# Patient Record
Sex: Female | Born: 1985
Health system: Southern US, Community
[De-identification: ages and names within clinical notes are randomized; demographics above are authoritative.]

## PROBLEM LIST (undated history)

## (undated) ENCOUNTER — Inpatient Hospital Stay (HOSPITAL_COMMUNITY): Payer: Self-pay

## (undated) DIAGNOSIS — A749 Chlamydial infection, unspecified: Secondary | ICD-10-CM

## (undated) DIAGNOSIS — Z3480 Encounter for supervision of other normal pregnancy, unspecified trimester: Secondary | ICD-10-CM

## (undated) DIAGNOSIS — Z803 Family history of malignant neoplasm of breast: Secondary | ICD-10-CM

## (undated) DIAGNOSIS — Z87440 Personal history of urinary (tract) infections: Secondary | ICD-10-CM

## (undated) DIAGNOSIS — G43909 Migraine, unspecified, not intractable, without status migrainosus: Secondary | ICD-10-CM

## (undated) DIAGNOSIS — O98819 Other maternal infectious and parasitic diseases complicating pregnancy, unspecified trimester: Secondary | ICD-10-CM

## (undated) HISTORY — DX: Personal history of urinary (tract) infections: Z87.440

## (undated) HISTORY — PX: DILATION AND CURETTAGE OF UTERUS: SHX78

---
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
## 2005-01-22 ENCOUNTER — Inpatient Hospital Stay (HOSPITAL_COMMUNITY): Admission: AD | Admit: 2005-01-22 | Discharge: 2005-01-22 | Payer: Self-pay | Admitting: Family Medicine

## 2005-03-14 ENCOUNTER — Ambulatory Visit (HOSPITAL_COMMUNITY): Admission: RE | Admit: 2005-03-14 | Discharge: 2005-03-14 | Payer: Self-pay | Admitting: Family Medicine

## 2005-04-01 ENCOUNTER — Ambulatory Visit: Payer: Self-pay | Admitting: Certified Nurse Midwife

## 2005-04-01 ENCOUNTER — Inpatient Hospital Stay (HOSPITAL_COMMUNITY): Admission: AD | Admit: 2005-04-01 | Discharge: 2005-04-01 | Payer: Self-pay | Admitting: Obstetrics & Gynecology

## 2005-05-22 ENCOUNTER — Inpatient Hospital Stay (HOSPITAL_COMMUNITY): Admission: AD | Admit: 2005-05-22 | Discharge: 2005-05-22 | Payer: Self-pay | Admitting: Gynecology

## 2005-06-25 ENCOUNTER — Inpatient Hospital Stay (HOSPITAL_COMMUNITY): Admission: AD | Admit: 2005-06-25 | Discharge: 2005-06-25 | Payer: Self-pay | Admitting: Family Medicine

## 2005-06-25 ENCOUNTER — Ambulatory Visit: Payer: Self-pay | Admitting: *Deleted

## 2005-06-26 ENCOUNTER — Ambulatory Visit: Payer: Self-pay | Admitting: *Deleted

## 2005-06-26 ENCOUNTER — Inpatient Hospital Stay (HOSPITAL_COMMUNITY): Admission: AD | Admit: 2005-06-26 | Discharge: 2005-06-26 | Payer: Self-pay | Admitting: Obstetrics & Gynecology

## 2005-07-11 ENCOUNTER — Ambulatory Visit: Payer: Self-pay | Admitting: Obstetrics and Gynecology

## 2005-07-11 ENCOUNTER — Inpatient Hospital Stay (HOSPITAL_COMMUNITY): Admission: AD | Admit: 2005-07-11 | Discharge: 2005-07-12 | Payer: Self-pay | Admitting: *Deleted

## 2005-07-22 ENCOUNTER — Inpatient Hospital Stay (HOSPITAL_COMMUNITY): Admission: AD | Admit: 2005-07-22 | Discharge: 2005-07-23 | Payer: Self-pay | Admitting: Obstetrics & Gynecology

## 2005-07-22 ENCOUNTER — Ambulatory Visit: Payer: Self-pay | Admitting: Obstetrics and Gynecology

## 2005-07-31 ENCOUNTER — Ambulatory Visit: Payer: Self-pay | Admitting: *Deleted

## 2005-07-31 ENCOUNTER — Inpatient Hospital Stay (HOSPITAL_COMMUNITY): Admission: AD | Admit: 2005-07-31 | Discharge: 2005-07-31 | Payer: Self-pay | Admitting: Obstetrics & Gynecology

## 2005-08-08 ENCOUNTER — Ambulatory Visit: Payer: Self-pay | Admitting: Obstetrics and Gynecology

## 2005-08-08 ENCOUNTER — Inpatient Hospital Stay (HOSPITAL_COMMUNITY): Admission: AD | Admit: 2005-08-08 | Discharge: 2005-08-10 | Payer: Self-pay | Admitting: Obstetrics & Gynecology

## 2006-06-20 ENCOUNTER — Inpatient Hospital Stay (HOSPITAL_COMMUNITY): Admission: AD | Admit: 2006-06-20 | Discharge: 2006-06-20 | Payer: Self-pay | Admitting: Gynecology

## 2006-07-25 ENCOUNTER — Ambulatory Visit (HOSPITAL_COMMUNITY): Admission: RE | Admit: 2006-07-25 | Discharge: 2006-07-25 | Payer: Self-pay | Admitting: Obstetrics & Gynecology

## 2006-09-16 ENCOUNTER — Inpatient Hospital Stay (HOSPITAL_COMMUNITY): Admission: AD | Admit: 2006-09-16 | Discharge: 2006-09-16 | Payer: Self-pay | Admitting: Obstetrics and Gynecology

## 2006-10-11 ENCOUNTER — Observation Stay (HOSPITAL_COMMUNITY): Admission: AD | Admit: 2006-10-11 | Discharge: 2006-10-12 | Payer: Self-pay | Admitting: Gynecology

## 2006-11-06 ENCOUNTER — Inpatient Hospital Stay (HOSPITAL_COMMUNITY): Admission: AD | Admit: 2006-11-06 | Discharge: 2006-11-07 | Payer: Self-pay | Admitting: Obstetrics & Gynecology

## 2006-11-24 ENCOUNTER — Inpatient Hospital Stay (HOSPITAL_COMMUNITY): Admission: AD | Admit: 2006-11-24 | Discharge: 2006-11-24 | Payer: Self-pay | Admitting: Obstetrics

## 2006-12-07 ENCOUNTER — Inpatient Hospital Stay (HOSPITAL_COMMUNITY): Admission: AD | Admit: 2006-12-07 | Discharge: 2006-12-07 | Payer: Self-pay | Admitting: Obstetrics & Gynecology

## 2006-12-08 ENCOUNTER — Inpatient Hospital Stay (HOSPITAL_COMMUNITY): Admission: AD | Admit: 2006-12-08 | Discharge: 2006-12-11 | Payer: Self-pay | Admitting: Obstetrics & Gynecology

## 2007-09-22 ENCOUNTER — Inpatient Hospital Stay (HOSPITAL_COMMUNITY): Admission: AD | Admit: 2007-09-22 | Discharge: 2007-09-22 | Payer: Self-pay | Admitting: Obstetrics and Gynecology

## 2008-01-31 ENCOUNTER — Emergency Department (HOSPITAL_COMMUNITY): Admission: EM | Admit: 2008-01-31 | Discharge: 2008-01-31 | Payer: Self-pay | Admitting: Emergency Medicine

## 2008-02-03 ENCOUNTER — Emergency Department (HOSPITAL_COMMUNITY): Admission: EM | Admit: 2008-02-03 | Discharge: 2008-02-03 | Payer: Self-pay | Admitting: Emergency Medicine

## 2009-01-22 ENCOUNTER — Emergency Department (HOSPITAL_COMMUNITY): Admission: EM | Admit: 2009-01-22 | Discharge: 2009-01-23 | Payer: Self-pay | Admitting: Emergency Medicine

## 2009-08-11 ENCOUNTER — Emergency Department (HOSPITAL_COMMUNITY): Admission: EM | Admit: 2009-08-11 | Discharge: 2009-08-11 | Payer: Self-pay | Admitting: Emergency Medicine

## 2009-09-24 ENCOUNTER — Emergency Department (HOSPITAL_COMMUNITY): Admission: EM | Admit: 2009-09-24 | Discharge: 2009-09-24 | Payer: Self-pay | Admitting: Emergency Medicine

## 2010-04-24 ENCOUNTER — Emergency Department (HOSPITAL_COMMUNITY): Admission: EM | Admit: 2010-04-24 | Discharge: 2010-04-24 | Payer: Self-pay | Admitting: Emergency Medicine

## 2010-04-25 ENCOUNTER — Emergency Department (HOSPITAL_COMMUNITY): Admission: EM | Admit: 2010-04-25 | Discharge: 2010-04-25 | Payer: Self-pay | Admitting: Emergency Medicine

## 2010-06-02 ENCOUNTER — Inpatient Hospital Stay (HOSPITAL_COMMUNITY): Admission: AD | Admit: 2010-06-02 | Discharge: 2010-06-02 | Payer: Self-pay | Admitting: Obstetrics & Gynecology

## 2010-06-02 ENCOUNTER — Ambulatory Visit: Payer: Self-pay | Admitting: Gynecology

## 2010-06-14 ENCOUNTER — Inpatient Hospital Stay (HOSPITAL_COMMUNITY): Admission: AD | Admit: 2010-06-14 | Discharge: 2010-06-14 | Payer: Self-pay | Admitting: Obstetrics & Gynecology

## 2010-06-18 ENCOUNTER — Ambulatory Visit: Payer: Self-pay | Admitting: Nurse Practitioner

## 2010-06-18 ENCOUNTER — Inpatient Hospital Stay (HOSPITAL_COMMUNITY): Admission: AD | Admit: 2010-06-18 | Discharge: 2010-06-18 | Payer: Self-pay | Admitting: Obstetrics & Gynecology

## 2010-06-23 ENCOUNTER — Ambulatory Visit: Payer: Self-pay | Admitting: Obstetrics and Gynecology

## 2010-06-23 ENCOUNTER — Inpatient Hospital Stay (HOSPITAL_COMMUNITY): Admission: AD | Admit: 2010-06-23 | Discharge: 2010-06-23 | Payer: Self-pay | Admitting: Obstetrics & Gynecology

## 2010-06-24 ENCOUNTER — Inpatient Hospital Stay (HOSPITAL_COMMUNITY): Admission: AD | Admit: 2010-06-24 | Discharge: 2010-06-24 | Payer: Self-pay | Admitting: Obstetrics and Gynecology

## 2010-06-24 ENCOUNTER — Ambulatory Visit: Payer: Self-pay | Admitting: Gynecology

## 2010-11-20 ENCOUNTER — Encounter: Payer: Self-pay | Admitting: *Deleted

## 2010-11-20 ENCOUNTER — Encounter: Payer: Self-pay | Admitting: Obstetrics & Gynecology

## 2011-01-12 LAB — URINALYSIS, ROUTINE W REFLEX MICROSCOPIC
Bilirubin Urine: NEGATIVE
Bilirubin Urine: NEGATIVE
Glucose, UA: NEGATIVE mg/dL
Ketones, ur: NEGATIVE mg/dL
Nitrite: NEGATIVE
Protein, ur: 100 mg/dL — AB
Protein, ur: NEGATIVE mg/dL
Specific Gravity, Urine: 1.015 (ref 1.005–1.030)
Specific Gravity, Urine: >1.03 — ABNORMAL HIGH (ref 1.005–1.030)
Urobilinogen, UA: 0.2 mg/dL (ref 0.0–1.0)
Urobilinogen, UA: 1 mg/dL (ref 0.0–1.0)
pH: 7.5 (ref 5.0–8.0)

## 2011-01-12 LAB — CBC
HCT: 36.5 % (ref 36.0–46.0)
Hemoglobin: 11.8 g/dL — ABNORMAL LOW (ref 12.0–15.0)
Hemoglobin: 12.4 g/dL (ref 12.0–15.0)
MCH: 31.5 pg (ref 26.0–34.0)
MCH: 31.5 pg (ref 26.0–34.0)
MCH: 31.6 pg (ref 26.0–34.0)
MCHC: 33.7 g/dL (ref 30.0–36.0)
MCHC: 33.9 g/dL (ref 30.0–36.0)
MCV: 93.2 fL (ref 78.0–100.0)
Platelets: 337 10*3/uL (ref 150–400)
Platelets: 380 10*3/uL (ref 150–400)
RBC: 3.73 MIL/uL — ABNORMAL LOW (ref 3.87–5.11)
RBC: 3.86 MIL/uL — ABNORMAL LOW (ref 3.87–5.11)
RBC: 3.91 MIL/uL (ref 3.87–5.11)
RDW: 13.4 % (ref 11.5–15.5)
RDW: 13.6 % (ref 11.5–15.5)
WBC: 4.5 10*3/uL (ref 4.0–10.5)

## 2011-01-12 LAB — URINE MICROSCOPIC-ADD ON

## 2011-01-12 LAB — HCG, QUANTITATIVE, PREGNANCY: hCG, Beta Chain, Quant, S: 2561 m[IU]/mL — ABNORMAL HIGH (ref <5)

## 2011-01-12 LAB — URINE CULTURE
Colony Count: >=100000
Culture  Setup Time: 201108260403

## 2011-01-13 LAB — HCG, QUANTITATIVE, PREGNANCY: hCG, Beta Chain, Quant, S: 27594 m[IU]/mL — ABNORMAL HIGH (ref <5)

## 2011-01-13 LAB — URINALYSIS, ROUTINE W REFLEX MICROSCOPIC
Bilirubin Urine: NEGATIVE
Ketones, ur: NEGATIVE mg/dL
Nitrite: NEGATIVE
Protein, ur: NEGATIVE mg/dL
Specific Gravity, Urine: >1.03 — ABNORMAL HIGH (ref 1.005–1.030)
Urobilinogen, UA: 0.2 mg/dL (ref 0.0–1.0)

## 2011-01-13 LAB — GC/CHLAMYDIA PROBE AMP, GENITAL: Chlamydia, DNA Probe: NEGATIVE

## 2011-01-13 LAB — WET PREP, GENITAL: Yeast Wet Prep HPF POC: NONE SEEN

## 2011-01-13 LAB — CBC
MCH: 31.4 pg (ref 26.0–34.0)
MCHC: 33.6 g/dL (ref 30.0–36.0)
MCV: 93.5 fL (ref 78.0–100.0)
Platelets: 409 10*3/uL — ABNORMAL HIGH (ref 150–400)

## 2011-01-13 LAB — URINE CULTURE: Colony Count: NO GROWTH

## 2011-01-15 LAB — URINALYSIS, ROUTINE W REFLEX MICROSCOPIC
Glucose, UA: NEGATIVE mg/dL
Nitrite: POSITIVE — AB
Protein, ur: 30 mg/dL — AB
pH: 6 (ref 5.0–8.0)

## 2011-01-15 LAB — URINE MICROSCOPIC-ADD ON

## 2011-02-01 LAB — URINE MICROSCOPIC-ADD ON

## 2011-02-01 LAB — URINALYSIS, ROUTINE W REFLEX MICROSCOPIC
Glucose, UA: NEGATIVE mg/dL
Hgb urine dipstick: NEGATIVE
Protein, ur: NEGATIVE mg/dL
pH: 6.5 (ref 5.0–8.0)

## 2011-02-01 LAB — WET PREP, GENITAL

## 2011-02-01 LAB — GC/CHLAMYDIA PROBE AMP, GENITAL: Chlamydia, DNA Probe: NEGATIVE

## 2011-03-27 ENCOUNTER — Emergency Department (HOSPITAL_COMMUNITY)
Admission: EM | Admit: 2011-03-27 | Discharge: 2011-03-27 | Disposition: A | Discharge status: Home / Self Care | Payer: Medicaid Other | Attending: Emergency Medicine | Admitting: Emergency Medicine

## 2011-03-27 DIAGNOSIS — R110 Nausea: Secondary | ICD-10-CM | POA: Insufficient documentation

## 2011-03-27 DIAGNOSIS — R109 Unspecified abdominal pain: Secondary | ICD-10-CM | POA: Insufficient documentation

## 2011-03-27 DIAGNOSIS — N739 Female pelvic inflammatory disease, unspecified: Secondary | ICD-10-CM | POA: Insufficient documentation

## 2011-03-27 DIAGNOSIS — R10819 Abdominal tenderness, unspecified site: Secondary | ICD-10-CM | POA: Insufficient documentation

## 2011-03-27 LAB — POCT I-STAT, CHEM 8
BUN: 19 mg/dL (ref 6–23)
Chloride: 104 mEq/L (ref 96–112)
Creatinine, Ser: 1 mg/dL (ref 0.4–1.2)
Potassium: 3.7 mEq/L (ref 3.5–5.1)
Sodium: 139 mEq/L (ref 135–145)
TCO2: 26 mmol/L (ref 0–100)

## 2011-03-27 LAB — URINALYSIS, ROUTINE W REFLEX MICROSCOPIC
Ketones, ur: NEGATIVE mg/dL
Nitrite: NEGATIVE
Protein, ur: NEGATIVE mg/dL

## 2011-03-27 LAB — POCT PREGNANCY, URINE: Preg Test, Ur: NEGATIVE

## 2011-03-27 LAB — WET PREP, GENITAL

## 2011-03-28 LAB — GC/CHLAMYDIA PROBE AMP, GENITAL: GC Probe Amp, Genital: NEGATIVE

## 2011-07-13 ENCOUNTER — Inpatient Hospital Stay (HOSPITAL_COMMUNITY)
Admission: AD | Admit: 2011-07-13 | Discharge: 2011-07-13 | Disposition: A | Discharge status: Home / Self Care | Payer: Medicaid Other | Source: Ambulatory Visit | Attending: Family Medicine | Admitting: Family Medicine

## 2011-07-13 ENCOUNTER — Encounter (HOSPITAL_COMMUNITY): Payer: Self-pay | Admitting: *Deleted

## 2011-07-13 ENCOUNTER — Inpatient Hospital Stay (HOSPITAL_COMMUNITY): Payer: Medicaid Other

## 2011-07-13 DIAGNOSIS — O99891 Other specified diseases and conditions complicating pregnancy: Secondary | ICD-10-CM | POA: Insufficient documentation

## 2011-07-13 DIAGNOSIS — G43909 Migraine, unspecified, not intractable, without status migrainosus: Secondary | ICD-10-CM | POA: Insufficient documentation

## 2011-07-13 DIAGNOSIS — R109 Unspecified abdominal pain: Secondary | ICD-10-CM | POA: Insufficient documentation

## 2011-07-13 DIAGNOSIS — O26899 Other specified pregnancy related conditions, unspecified trimester: Secondary | ICD-10-CM

## 2011-07-13 HISTORY — DX: Migraine, unspecified, not intractable, without status migrainosus: G43.909

## 2011-07-13 LAB — URINALYSIS, ROUTINE W REFLEX MICROSCOPIC
Protein, ur: NEGATIVE mg/dL
Urobilinogen, UA: 0.2 mg/dL (ref 0.0–1.0)

## 2011-07-13 LAB — WET PREP, GENITAL
Trich, Wet Prep: NONE SEEN
Yeast Wet Prep HPF POC: NONE SEEN

## 2011-07-13 LAB — CBC
MCHC: 33.5 g/dL (ref 30.0–36.0)
MCV: 90.2 fL (ref 78.0–100.0)
Platelets: 363 10*3/uL (ref 150–400)
RDW: 12.2 % (ref 11.5–15.5)
WBC: 4.7 10*3/uL (ref 4.0–10.5)

## 2011-07-13 LAB — DIFFERENTIAL
Basophils Absolute: 0 10*3/uL (ref 0.0–0.1)
Basophils Relative: 1 % (ref 0–1)
Eosinophils Absolute: 0.1 10*3/uL (ref 0.0–0.7)
Eosinophils Relative: 1 % (ref 0–5)
Lymphocytes Relative: 49 % — ABNORMAL HIGH (ref 12–46)

## 2011-07-13 LAB — POCT PREGNANCY, URINE: Preg Test, Ur: POSITIVE

## 2011-07-13 LAB — URINE MICROSCOPIC-ADD ON

## 2011-07-13 LAB — HCG, QUANTITATIVE, PREGNANCY: hCG, Beta Chain, Quant, S: 9454 m[IU]/mL — ABNORMAL HIGH (ref <5)

## 2011-07-13 MED ORDER — ACETAMINOPHEN 500 MG PO TABS
1000.0000 mg | ORAL_TABLET | Freq: Once | ORAL | Status: AC
  Administered 2011-07-13: 1000 mg via ORAL

## 2011-07-13 NOTE — Progress Notes (Signed)
Pt states she has been having abdominal cramping for several days. Has been having bad headaches for about 2 weeks, has periods of sweating. Has a vaginal d/c with no odor. Did a UPT that was POS but cannot remember the date of her last period in August.

## 2011-07-13 NOTE — ED Provider Notes (Signed)
History     CSN: [id number] Arrival date & time: 07/13/2011  1:37 PM   Chief Complaint  Patient presents with  . Abdominal Pain      HPI Margaret Knight 82 y.o.presents with migraine headache and lower abdominal cramping on the right. Has vaginal discharge that is clear without odor.   LMP ? Thinks she had it last month but it wasn't normal, lighter and shorter.  1 partner without contraception.   +UPT at home.  Hx migraine headache treated with "heavy" ibuprofen.  Hasn't had one lately.    Past Medical History  Diagnosis Date  . Migraines      Past Surgical History  Procedure Date  . No past surgeries     No family history on file.  History  Substance Use Topics  . Smoking status: Never Smoker   . Smokeless tobacco: Not on file  . Alcohol Use: No    OB History    Grav Para Term Preterm Abortions TAB SAB Ect Mult Living   5 2 2  1  1   2       Review of Systems  Gastrointestinal:       Abdominal pain   Genitourinary: Positive for vaginal discharge and pelvic pain.  Neurological: Positive for headaches.    Allergies  Review of patient's allergies indicates no known allergies.  Home Medications  No current outpatient prescriptions on file.  Physical Exam    BP 106/69  Pulse 77  Temp(Src) 99.4 F (37.4 C) (Oral)  Resp 18  Ht 5\' 7"  (1.702 m)  Wt 146 lb 3.2 oz (66.316 kg)  BMI 22.90 kg/m2  SpO2 99%  LMP 05/08/2011  Physical Exam  Constitutional: She is oriented to person, place, and time. She appears well-developed and well-nourished. No distress.  Neck: Normal range of motion.  Pulmonary/Chest: Effort normal.  Abdominal: Soft. She exhibits no distension and no mass. There is no tenderness. There is no rebound and no guarding.  Genitourinary: Uterus is enlarged and tender. Cervix exhibits motion tenderness. Cervix exhibits no discharge and no friability. Right adnexum displays no mass, no tenderness and no fullness. Left adnexum displays tenderness.  Left adnexum displays no mass and no fullness. No erythema or bleeding around the vagina. Vaginal discharge found.  Neurological: She is alert and oriented to person, place, and time. No cranial nerve deficit. Coordination normal.       Normal speech and coordination  Skin: Skin is warm and dry.  Psychiatric: She has a normal mood and affect.    ED Course  Procedures   BLOOD TYPE FROM PREVIOUS RECORD IS O POSITIVE  Results for orders placed during the hospital encounter of 07/13/11  URINALYSIS, ROUTINE W REFLEX MICROSCOPIC      Component Value Range   Color, Urine YELLOW  YELLOW    Appearance HAZY (*) CLEAR    Specific Gravity, Urine 1.025  1.005 - 1.030    pH 6.0  5.0 - 8.0    Glucose, UA NEGATIVE  NEGATIVE (mg/dL)   Hgb urine dipstick NEGATIVE  NEGATIVE    Bilirubin Urine NEGATIVE  NEGATIVE    Ketones, ur NEGATIVE  NEGATIVE (mg/dL)   Protein, ur NEGATIVE  NEGATIVE (mg/dL)   Urobilinogen, UA 0.2  0.0 - 1.0 (mg/dL)   Nitrite NEGATIVE  NEGATIVE    Leukocytes, UA SMALL (*) NEGATIVE   POCT PREGNANCY, URINE      Component Value Range   Preg Test, Ur POSITIVE     Results  for orders placed during the hospital encounter of 07/13/11 (from the past 24 hour(s))  URINALYSIS, ROUTINE W REFLEX MICROSCOPIC     Status: Abnormal   Collection Time   07/13/11  2:10 PM      Component Value Range   Color, Urine YELLOW  YELLOW    Appearance HAZY (*) CLEAR    Specific Gravity, Urine 1.025  1.005 - 1.030    pH 6.0  5.0 - 8.0    Glucose, UA NEGATIVE  NEGATIVE (mg/dL)   Hgb urine dipstick NEGATIVE  NEGATIVE    Bilirubin Urine NEGATIVE  NEGATIVE    Ketones, ur NEGATIVE  NEGATIVE (mg/dL)   Protein, ur NEGATIVE  NEGATIVE (mg/dL)   Urobilinogen, UA 0.2  0.0 - 1.0 (mg/dL)   Nitrite NEGATIVE  NEGATIVE    Leukocytes, UA SMALL (*) NEGATIVE   URINE MICROSCOPIC-ADD ON     Status: Abnormal   Collection Time   07/13/11  2:10 PM      Component Value Range   Squamous Epithelial / LPF MANY (*) RARE    WBC,  UA 3-6  <3 (WBC/hpf)   Bacteria, UA FEW (*) RARE    Urine-Other MUCOUS PRESENT    POCT PREGNANCY, URINE     Status: Normal   Collection Time   07/13/11  2:22 PM      Component Value Range   Preg Test, Ur POSITIVE    HCG, QUANTITATIVE, PREGNANCY     Status: Abnormal   Collection Time   07/13/11  2:45 PM      Component Value Range   hCG, Beta Chain, Quant, S 9454 (*) <5 (mIU/mL)  CBC     Status: Abnormal   Collection Time   07/13/11  2:55 PM      Component Value Range   WBC 4.7  4.0 - 10.5 (K/uL)   RBC 3.97  3.87 - 5.11 (MIL/uL)   Hemoglobin 12.0  12.0 - 15.0 (g/dL)   HCT 16.1 (*) 09.6 - 46.0 (%)   MCV 90.2  78.0 - 100.0 (fL)   MCH 30.2  26.0 - 34.0 (pg)   MCHC 33.5  30.0 - 36.0 (g/dL)   RDW 04.5  40.9 - 81.1 (%)   Platelets 363  150 - 400 (K/uL)  DIFFERENTIAL     Status: Abnormal   Collection Time   07/13/11  2:55 PM      Component Value Range   Neutrophils Relative 38 (*) 43 - 77 (%)   Neutro Abs 1.8  1.7 - 7.7 (K/uL)   Lymphocytes Relative 49 (*) 12 - 46 (%)   Lymphs Abs 2.3  0.7 - 4.0 (K/uL)   Monocytes Relative 12  3 - 12 (%)   Monocytes Absolute 0.6  0.1 - 1.0 (K/uL)   Eosinophils Relative 1  0 - 5 (%)   Eosinophils Absolute 0.1  0.0 - 0.7 (K/uL)   Basophils Relative 1  0 - 1 (%)   Basophils Absolute 0.0  0.0 - 0.1 (K/uL)  WET PREP, GENITAL     Status: Abnormal   Collection Time   07/13/11  3:05 PM      Component Value Range   Yeast, Wet Prep NONE SEEN  NONE SEEN    Trich, Wet Prep NONE SEEN  NONE SEEN    Clue Cells, Wet Prep FEW (*) NONE SEEN    WBC, Wet Prep HPF POC MANY (*) NONE SEEN     ULTRASOUND REPORT:  Irregular single intrauterine gestational sac with  poor decidual reaction, no YS or FP.  Poor prognostic indicators and may represent an abnormal nonprogressing gestation.    At the time of discharge, the patient states the tylenol completely relieved her headache.   Assessment:  Abdominal pain in early pregnancy                        Migraine headache  relieved with tylenol  PLAN; repeat BHCG in 48 hrs.            Discussed u/s findings with the patient.  Explained irregular sac could be sign that a miscarriage will follow.          Matt Holmes, NP 07/13/11 1720  Matt Holmes, NP 07/13/11 1722

## 2011-07-13 NOTE — ED Provider Notes (Signed)
Attestation of Attending Supervision of Advanced Practitioner: Evaluation and management procedures were performed by the PA/NP/CNM/OB Fellow under my supervision/collaboration. Chart reviewed and agree with management and plan.  Aileene Lanum A 07/13/2011 6:46 PM   

## 2011-08-08 LAB — URINALYSIS, ROUTINE W REFLEX MICROSCOPIC
Bilirubin Urine: NEGATIVE
Ketones, ur: NEGATIVE
Nitrite: NEGATIVE
Protein, ur: NEGATIVE
Specific Gravity, Urine: 1.01
Urobilinogen, UA: 0.2

## 2011-08-08 LAB — CBC
HCT: 37.5
Hemoglobin: 13
MCHC: 34.5
MCV: 91.2
RDW: 12.9

## 2011-08-08 LAB — WET PREP, GENITAL
Trich, Wet Prep: NONE SEEN
Yeast Wet Prep HPF POC: NONE SEEN

## 2011-08-08 LAB — GC/CHLAMYDIA PROBE AMP, GENITAL
Chlamydia, DNA Probe: NEGATIVE
GC Probe Amp, Genital: NEGATIVE

## 2011-08-08 LAB — POCT PREGNANCY, URINE: Operator id: 117411

## 2011-09-11 LAB — OB RESULTS CONSOLE RUBELLA ANTIBODY, IGM: Rubella: IMMUNE

## 2011-09-11 LAB — OB RESULTS CONSOLE HEPATITIS B SURFACE ANTIGEN: Hepatitis B Surface Ag: NEGATIVE

## 2011-09-11 LAB — OB RESULTS CONSOLE ANTIBODY SCREEN: Antibody Screen: NEGATIVE

## 2011-09-11 LAB — OB RESULTS CONSOLE ABO/RH

## 2011-09-30 ENCOUNTER — Inpatient Hospital Stay (HOSPITAL_COMMUNITY)
Admission: AD | Admit: 2011-09-30 | Discharge: 2011-09-30 | Discharge status: Against Medical Advice | Payer: Medicaid Other | Source: Ambulatory Visit | Attending: Obstetrics and Gynecology | Admitting: Obstetrics and Gynecology

## 2011-09-30 ENCOUNTER — Encounter (HOSPITAL_COMMUNITY): Payer: Self-pay

## 2011-09-30 DIAGNOSIS — O210 Mild hyperemesis gravidarum: Secondary | ICD-10-CM | POA: Insufficient documentation

## 2011-09-30 DIAGNOSIS — R51 Headache: Secondary | ICD-10-CM | POA: Insufficient documentation

## 2011-09-30 LAB — URINALYSIS, ROUTINE W REFLEX MICROSCOPIC
Bilirubin Urine: NEGATIVE
Hgb urine dipstick: NEGATIVE
Specific Gravity, Urine: 1.025 (ref 1.005–1.030)
Urobilinogen, UA: 1 mg/dL (ref 0.0–1.0)

## 2011-09-30 LAB — URINE MICROSCOPIC-ADD ON

## 2011-09-30 MED ORDER — LACTATED RINGERS IV BOLUS (SEPSIS): 1000.0000 mL | Freq: Once | INTRAVENOUS | Status: DC

## 2011-09-30 MED ORDER — ONDANSETRON 8 MG/NS 50 ML IVPB
8.0000 mg | Freq: Once | INTRAVENOUS | Status: DC
  Filled 2011-09-30: qty 8

## 2011-09-30 NOTE — Plan of Care (Signed)
Patient is not in the lobby when called to room for the third time.

## 2011-09-30 NOTE — Plan of Care (Signed)
Patient is not in the lobby when called to a room.  

## 2011-09-30 NOTE — Progress Notes (Signed)
Patient states she has had 4 episodes of vomiting since this am. No diarrhea, bleeding or leaking. Has a slight headache when standing.

## 2011-09-30 NOTE — ED Notes (Signed)
Called, not in lobby 

## 2011-10-31 NOTE — L&D Delivery Note (Signed)
Delivery Note At 3:22 AM a viable female was delivered via Vaginal, Spontaneous Delivery after pushing for 5-10 minutes (Presentation: ; Occiput Anterior).  APGAR: 9, 9; weight P.   Placenta status: Intact, Spontaneous.  Cord: 3 vessels with the following complications: None.    Anesthesia: Epidural  Episiotomy: None Lacerations: 1st degree;Perineal Suture Repair: 3.0 vicryl rapide Est. Blood Loss (mL): 500  Mom to postpartum.  Baby to nursery-stable.  [name],Jakub Debold 03/10/2012, 3:41 AM  O+/ Br/ ? contra

## 2011-11-30 ENCOUNTER — Inpatient Hospital Stay (HOSPITAL_COMMUNITY)
Admission: AD | Admit: 2011-11-30 | Discharge: 2011-11-30 | Disposition: A | Discharge status: Home / Self Care | Payer: Medicaid Other | Source: Ambulatory Visit | Attending: Obstetrics and Gynecology | Admitting: Obstetrics and Gynecology

## 2011-11-30 ENCOUNTER — Encounter (HOSPITAL_COMMUNITY): Payer: Self-pay | Admitting: *Deleted

## 2011-11-30 DIAGNOSIS — O368190 Decreased fetal movements, unspecified trimester, not applicable or unspecified: Secondary | ICD-10-CM

## 2011-11-30 NOTE — ED Provider Notes (Signed)
History   Pt presents today c/o decreased fetal movement and diarrhea x 1. She denies vag bleeding, dc, irritation, or any other sx at this time.  Chief Complaint  Patient presents with  . Decreased Fetal Movement   HPI  OB History    Grav Para Term Preterm Abortions TAB SAB Ect Mult Living   5 2 2  1  1   2       Past Medical History  Diagnosis Date  . Migraines     Past Surgical History  Procedure Date  . No past surgeries     Family History  Problem Relation Age of Onset  . Anesthesia problems Neg Hx     History  Substance Use Topics  . Smoking status: Never Smoker   . Smokeless tobacco: Never Used  . Alcohol Use: No    Allergies: No Known Allergies  No prescriptions prior to admission    Review of Systems  Constitutional: Negative for fever and chills.  Eyes: Negative for blurred vision and double vision.  Respiratory: Negative for cough, hemoptysis, sputum production, shortness of breath and wheezing.   Cardiovascular: Negative for chest pain and palpitations.  Gastrointestinal: Positive for diarrhea. Negative for nausea, vomiting, abdominal pain and constipation.  Genitourinary: Negative for dysuria, urgency, frequency and hematuria.  Neurological: Negative for dizziness and headaches.  Psychiatric/Behavioral: Negative for depression and suicidal ideas.   Physical Exam   Blood pressure 108/63, pulse 83, temperature 98 F (36.7 C), temperature source Oral, resp. rate 18, height 5\' 7"  (1.702 m), weight 154 lb (69.854 kg), last menstrual period 05/08/2011.  Physical Exam  Nursing note and vitals reviewed. Constitutional: She is oriented to person, place, and time. She appears well-developed and well-nourished. No distress.  HENT:  Head: Normocephalic and atraumatic.  Eyes: EOM are normal. Pupils are equal, round, and reactive to light.  GI: Soft. She exhibits no distension and no mass. There is no tenderness. There is no rebound and no guarding.    Neurological: She is alert and oriented to person, place, and time.  Skin: Skin is warm and dry. She is not diaphoretic.  Psychiatric: She has a normal mood and affect. Her behavior is normal. Judgment and thought content normal.    MAU Course  Procedures  NST reactive for 26wks. No ctx noted.  Pt now feeling excellent fetal movement.  Discussed pt with Dr. Senaida Ores. Ok to [employer] with precautions.   Assessment and Plan  Decreased fetal movement: discussed with pt at length. She has f/u scheduled. Discussed diet, activity, risks, and precautions.   Clinton Gallant. Rice III, DrHSc, MPAS, PA-C  11/30/2011, 1:37 PM   Henrietta Hoover, PA 11/30/11 1343

## 2011-11-30 NOTE — Progress Notes (Signed)
Pt states she hasn't felt baby move x 2 days.  Obvious movement by palpation and visually observed.  Pt denies feeling either  Movement.  Denies vaginal bleeding.

## 2011-12-28 ENCOUNTER — Encounter (HOSPITAL_COMMUNITY): Payer: Self-pay | Admitting: Obstetrics and Gynecology

## 2011-12-28 ENCOUNTER — Inpatient Hospital Stay (HOSPITAL_COMMUNITY)
Admission: AD | Admit: 2011-12-28 | Discharge: 2011-12-28 | Disposition: A | Discharge status: Home Health | Payer: Medicaid Other | Source: Ambulatory Visit | Attending: Obstetrics and Gynecology | Admitting: Obstetrics and Gynecology

## 2011-12-28 DIAGNOSIS — B3731 Acute candidiasis of vulva and vagina: Secondary | ICD-10-CM

## 2011-12-28 DIAGNOSIS — B373 Candidiasis of vulva and vagina: Secondary | ICD-10-CM

## 2011-12-28 DIAGNOSIS — O2390 Unspecified genitourinary tract infection in pregnancy, unspecified trimester: Secondary | ICD-10-CM | POA: Insufficient documentation

## 2011-12-28 LAB — WET PREP, GENITAL

## 2011-12-28 MED ORDER — TERCONAZOLE 0.4 % VA CREA: 1.0000 | TOPICAL_CREAM | Freq: Every day | VAGINAL | Status: AC

## 2011-12-28 NOTE — ED Provider Notes (Signed)
History     Chief Complaint  Patient presents with  . Rupture of Membranes   HPI Margaret Knight is 26 y.o. [id number] [redacted]w[redacted]d weeks presenting with ? Rupture of membrane.  She called the office earlier this week because she was "contracting a lot".  Did not go into office.  States she was leaking fluid since Monday, not sure if it her water but is concerned it is that or infection.  Last intercourse was conception.  Denies contractions.  States she has had spotting at the time she would have had her cycle if she were not pregnant.   Was admitted for preterm labor 11/30/11, was given steroids at that time.    Past Medical History  Diagnosis Date  . Migraines     Past Surgical History  Procedure Date  . No past surgeries     Family History  Problem Relation Age of Onset  . Anesthesia problems Neg Hx     History  Substance Use Topics  . Smoking status: Never Smoker   . Smokeless tobacco: Never Used  . Alcohol Use: No    Allergies: No Known Allergies  No prescriptions prior to admission    Review of Systems  Constitutional: Negative.   Gastrointestinal: Negative for abdominal pain.  Genitourinary:       Leaking of ? fluid   Physical Exam   Blood pressure 114/65, pulse 89, temperature 98.4 F (36.9 C), temperature source Oral, resp. rate 18, height 5\' 7"  (1.702 m), weight 73.029 kg (161 lb), last menstrual period 05/08/2011.   FHR 144.   Physical Exam  Constitutional: She is oriented to person, place, and time. She appears well-developed and well-nourished.  HENT:  Head: Normocephalic.  Neck: Normal range of motion.  Cardiovascular: Normal rate.   Respiratory: Effort normal.  GI: There is no tenderness.  Genitourinary: Uterus is not tender. Enlarged: gravid. Cervix exhibits no motion tenderness and no friability. No bleeding around the vagina. Vaginal discharge (moderate amount of thick yellowish/white discharge) found.       Negative for pooling of fluid.  Cervix is  closed, thick, soft.  Neurological: She is alert and oriented to person, place, and time.  Skin: Skin is warm and dry.  Psychiatric: She has a normal mood and affect. Her behavior is normal.   Results for orders placed during the hospital encounter of 12/28/11 (from the past 24 hour(s))  WET PREP, GENITAL     Status: Abnormal   Collection Time   12/28/11  7:30 PM      Component Value Range   Yeast Wet Prep HPF POC MANY (*) NONE SEEN    Trich, Wet Prep NONE SEEN  NONE SEEN    Clue Cells Wet Prep HPF POC NONE SEEN  NONE SEEN    WBC, Wet Prep HPF POC MANY (*) NONE SEEN    SLIDE FOR FERNING IS NEGATIVE  FMS-contractions not seen.  FHR 144 MAU Course  Procedures  MDM 19:35  REported to Dr. Senaida Ores patient's hx, presenting concern, negative fern.    Assessment and Plan  A:  Yeast vaginitis       [redacted]w[redacted]d gestation  P:  Terazol 7 Vaginal creme at hs X 1 week.     Keep your scheduled appointment in the office for continued prenatal care.  Myreon Wimer,EVE M 12/28/2011, 7:15 PM   Matt Holmes, NP 12/28/11 2008

## 2011-12-28 NOTE — Progress Notes (Signed)
Pt presents to MAU with chief complaint of leakage of fluid. Pt is [redacted]w[redacted]d, G6P2, Hx of preterm labor that required hospital admission. Pt says the leaking of fluid started 3-4 days and says it got heavier today. Fluid is described as white/clear, small amount, says the fluid soaked thru her tights and at that point she called her Dr's office.

## 2011-12-28 NOTE — Discharge Instructions (Signed)
Candidal Vulvovaginitis Candidal vulvovaginitis is an infection of the vagina and vulva. The vulva is the skin around the opening of the vagina. This may cause itching and discomfort in and around the vagina.  HOME CARE  Only take medicine as told by your doctor.   Do not have sex (intercourse) until the infection is healed or as told by your doctor.   Practice safe sex.   Tell your sex partner about your infection.   Do not douche or use tampons.   Wear cotton underwear. Do not wear tight pants or panty hose.   Eat yogurt. This may help treat and prevent yeast infections.  GET HELP RIGHT AWAY IF:   You have a fever.   Your problems get worse during treatment or do not get better in 3 days.   You have discomfort, irritation, or itching in your vagina or vulva area.   You have pain after sex.   You start to get belly (abdominal) pain.  MAKE SURE YOU:  Understand these instructions.   Will watch your condition.   Will get help right away if you are not doing well or get worse.  Document Released: 01/12/2009 Document Revised: 06/28/2011 Document Reviewed: 01/12/2009 [employer] Patient Information 2012 [employer]. 

## 2012-02-08 ENCOUNTER — Inpatient Hospital Stay (HOSPITAL_COMMUNITY)
Admission: AD | Admit: 2012-02-08 | Discharge: 2012-02-08 | Disposition: A | Discharge status: Home / Self Care | Payer: Medicaid Other | Source: Ambulatory Visit | Attending: Obstetrics and Gynecology | Admitting: Obstetrics and Gynecology

## 2012-02-08 ENCOUNTER — Encounter (HOSPITAL_COMMUNITY): Payer: Self-pay | Admitting: *Deleted

## 2012-02-08 DIAGNOSIS — O4700 False labor before 37 completed weeks of gestation, unspecified trimester: Secondary | ICD-10-CM | POA: Insufficient documentation

## 2012-02-08 NOTE — MAU Note (Signed)
PT  SAYS SHE STARTED HURTING AT 750PM.    APPOINTMENT  TOMORROW.     DENIES HSV AND MRSA.    THINKS HAS BEEN LEAKING - CAME  HERE- YEAST INFECTION-  TOOK MED.  WENT TO DR- DR  NOT SROM.

## 2012-02-08 NOTE — Discharge Instructions (Signed)
Fetal Movement Counts Patient Name: __________________________________________________ Patient Due Date: ____________________ Kick counts is highly recommended in high risk pregnancies, but it is a good idea for every pregnant woman to do. Start counting fetal movements at 28 weeks of the pregnancy. Fetal movements increase after eating a full meal or eating or drinking something sweet (the blood sugar is higher). It is also important to drink plenty of fluids (well hydrated) before doing the count. Lie on your left side because it helps with the circulation or you can sit in a comfortable chair with your arms over your belly (abdomen) with no distractions around you. DOING THE COUNT  Try to do the count the same time of day each time you do it.   Mark the day and time, then see how long it takes for you to feel 10 movements (kicks, flutters, swishes, rolls). You should have at least 10 movements within 2 hours. You will most likely feel 10 movements in much less than 2 hours. If you do not, wait an hour and count again. After a couple of days you will see a pattern.   What you are looking for is a change in the pattern or not enough counts in 2 hours. Is it taking longer in time to reach 10 movements?  SEEK MEDICAL CARE IF:  You feel less than 10 counts in 2 hours. Tried twice.   No movement in one hour.   The pattern is changing or taking longer each day to reach 10 counts in 2 hours.   You feel the baby is not moving as it usually does.  Date: ____________ Movements: ____________ Start time: ____________ Finish time: ____________  Date: ____________ Movements: ____________ Start time: ____________ Finish time: ____________ Date: ____________ Movements: ____________ Start time: ____________ Finish time: ____________ Date: ____________ Movements: ____________ Start time: ____________ Finish time: ____________ Date: ____________ Movements: ____________ Start time: ____________ Finish time:  ____________ Date: ____________ Movements: ____________ Start time: ____________ Finish time: ____________ Date: ____________ Movements: ____________ Start time: ____________ Finish time: ____________ Date: ____________ Movements: ____________ Start time: ____________ Finish time: ____________  Date: ____________ Movements: ____________ Start time: ____________ Finish time: ____________ Date: ____________ Movements: ____________ Start time: ____________ Finish time: ____________ Date: ____________ Movements: ____________ Start time: ____________ Finish time: ____________ Date: ____________ Movements: ____________ Start time: ____________ Finish time: ____________ Date: ____________ Movements: ____________ Start time: ____________ Finish time: ____________ Date: ____________ Movements: ____________ Start time: ____________ Finish time: ____________ Date: ____________ Movements: ____________ Start time: ____________ Finish time: ____________  Date: ____________ Movements: ____________ Start time: ____________ Finish time: ____________ Date: ____________ Movements: ____________ Start time: ____________ Finish time: ____________ Date: ____________ Movements: ____________ Start time: ____________ Finish time: ____________ Date: ____________ Movements: ____________ Start time: ____________ Finish time: ____________ Date: ____________ Movements: ____________ Start time: ____________ Finish time: ____________ Date: ____________ Movements: ____________ Start time: ____________ Finish time: ____________ Date: ____________ Movements: ____________ Start time: ____________ Finish time: ____________  Date: ____________ Movements: ____________ Start time: ____________ Finish time: ____________ Date: ____________ Movements: ____________ Start time: ____________ Finish time: ____________ Date: ____________ Movements: ____________ Start time: ____________ Finish time: ____________ Date: ____________ Movements:  ____________ Start time: ____________ Finish time: ____________ Date: ____________ Movements: ____________ Start time: ____________ Finish time: ____________ Date: ____________ Movements: ____________ Start time: ____________ Finish time: ____________ Date: ____________ Movements: ____________ Start time: ____________ Finish time: ____________  Date: ____________ Movements: ____________ Start time: ____________ Finish time: ____________ Date: ____________ Movements: ____________ Start time: ____________ Finish time: ____________ Date: ____________ Movements: ____________ Start time:   ____________ Finish time: ____________ Date: ____________ Movements: ____________ Start time: ____________ Finish time: ____________ Date: ____________ Movements: ____________ Start time: ____________ Finish time: ____________ Date: ____________ Movements: ____________ Start time: ____________ Finish time: ____________ Date: ____________ Movements: ____________ Start time: ____________ Finish time: ____________  Date: ____________ Movements: ____________ Start time: ____________ Finish time: ____________ Date: ____________ Movements: ____________ Start time: ____________ Finish time: ____________ Date: ____________ Movements: ____________ Start time: ____________ Finish time: ____________ Date: ____________ Movements: ____________ Start time: ____________ Finish time: ____________ Date: ____________ Movements: ____________ Start time: ____________ Finish time: ____________ Date: ____________ Movements: ____________ Start time: ____________ Finish time: ____________ Date: ____________ Movements: ____________ Start time: ____________ Finish time: ____________  Date: ____________ Movements: ____________ Start time: ____________ Finish time: ____________ Date: ____________ Movements: ____________ Start time: ____________ Finish time: ____________ Date: ____________ Movements: ____________ Start time: ____________ Finish  time: ____________ Date: ____________ Movements: ____________ Start time: ____________ Finish time: ____________ Date: ____________ Movements: ____________ Start time: ____________ Finish time: ____________ Date: ____________ Movements: ____________ Start time: ____________ Finish time: ____________ Date: ____________ Movements: ____________ Start time: ____________ Finish time: ____________  Date: ____________ Movements: ____________ Start time: ____________ Finish time: ____________ Date: ____________ Movements: ____________ Start time: ____________ Finish time: ____________ Date: ____________ Movements: ____________ Start time: ____________ Finish time: ____________ Date: ____________ Movements: ____________ Start time: ____________ Finish time: ____________ Date: ____________ Movements: ____________ Start time: ____________ Finish time: ____________ Date: ____________ Movements: ____________ Start time: ____________ Finish time: ____________ Document Released: 11/15/2006 Document Revised: 10/05/2011 Document Reviewed: 05/18/2009 [employer] Patient Information 2012 [employer].Braxton Hicks Contractions Pregnancy is commonly associated with contractions of the uterus throughout the pregnancy. Towards the end of pregnancy (32 to 34 weeks), these contractions (Braxton Hicks) can develop more often and may become more forceful. This is not true labor because these contractions do not result in opening (dilatation) and thinning of the cervix. They are sometimes difficult to tell apart from true labor because these contractions can be forceful and people have different pain tolerances. You should not feel embarrassed if you go to the hospital with false labor. Sometimes, the only way to tell if you are in true labor is for your caregiver to follow the changes in the cervix. How to tell the difference between true and false labor:  False labor.   The contractions of false labor are usually shorter,  irregular and not as hard as those of true labor.   They are often felt in the front of the lower abdomen and in the groin.   They may leave with walking around or changing positions while lying down.   They get weaker and are shorter lasting as time goes on.   These contractions are usually irregular.   They do not usually become progressively stronger, regular and closer together as with true labor.   True labor.   Contractions in true labor last 30 to 70 seconds, become very regular, usually become more intense, and increase in frequency.   They do not go away with walking.   The discomfort is usually felt in the top of the uterus and spreads to the lower abdomen and low back.   True labor can be determined by your caregiver with an exam. This will show that the cervix is dilating and getting thinner.  If there are no prenatal problems or other health problems associated with the pregnancy, it is completely safe to be sent home with false labor and await the onset of true labor. HOME CARE INSTRUCTIONS   Keep up   with your usual exercises and instructions.   Take medications as directed.   Keep your regular prenatal appointment.   Eat and drink lightly if you think you are going into labor.   If BH contractions are making you uncomfortable:   Change your activity position from lying down or resting to walking/walking to resting.   Sit and rest in a tub of warm water.   Drink 2 to 3 glasses of water. Dehydration may cause B-H contractions.   Do slow and deep breathing several times an hour.  SEEK IMMEDIATE MEDICAL CARE IF:   Your contractions continue to become stronger, more regular, and closer together.   You have a gushing, burst or leaking of fluid from the vagina.   An oral temperature above 102 F (38.9 C) develops.   You have passage of blood-tinged mucus.   You develop vaginal bleeding.   You develop continuous belly (abdominal) pain.   You have low  back pain that you never had before.   You feel the baby's head pushing down causing pelvic pressure.   The baby is not moving as much as it used to.  Document Released: 10/16/2005 Document Revised: 10/05/2011 Document Reviewed: 04/09/2009 [employer] Patient Information 2012 [employer]. 

## 2012-02-28 ENCOUNTER — Telehealth (HOSPITAL_COMMUNITY): Payer: Self-pay | Admitting: *Deleted

## 2012-02-28 ENCOUNTER — Encounter (HOSPITAL_COMMUNITY): Payer: Self-pay | Admitting: *Deleted

## 2012-02-28 NOTE — Telephone Encounter (Signed)
Preadmission screen  

## 2012-03-08 ENCOUNTER — Inpatient Hospital Stay (HOSPITAL_COMMUNITY)
Admission: AD | Admit: 2012-03-08 | Discharge: 2012-03-08 | Disposition: A | Discharge status: Home / Self Care | Payer: Medicaid Other | Source: Ambulatory Visit | Attending: Obstetrics and Gynecology | Admitting: Obstetrics and Gynecology

## 2012-03-08 ENCOUNTER — Encounter (HOSPITAL_COMMUNITY): Payer: Self-pay | Admitting: *Deleted

## 2012-03-08 DIAGNOSIS — O479 False labor, unspecified: Secondary | ICD-10-CM | POA: Insufficient documentation

## 2012-03-08 DIAGNOSIS — R197 Diarrhea, unspecified: Secondary | ICD-10-CM | POA: Insufficient documentation

## 2012-03-08 MED ORDER — LACTATED RINGERS IV BOLUS (SEPSIS): 1000.0000 mL | Freq: Once | INTRAVENOUS | Status: DC

## 2012-03-08 NOTE — Discharge Instructions (Signed)
Fetal Movement Counts Patient Name: __________________________________________________ Patient Due Date: ____________________ Kick counts is highly recommended in high risk pregnancies, but it is a good idea for every pregnant woman to do. Start counting fetal movements at 28 weeks of the pregnancy. Fetal movements increase after eating a full meal or eating or drinking something sweet (the blood sugar is higher). It is also important to drink plenty of fluids (well hydrated) before doing the count. Lie on your left side because it helps with the circulation or you can sit in a comfortable chair with your arms over your belly (abdomen) with no distractions around you. DOING THE COUNT  Try to do the count the same time of day each time you do it.   Mark the day and time, then see how long it takes for you to feel 10 movements (kicks, flutters, swishes, rolls). You should have at least 10 movements within 2 hours. You will most likely feel 10 movements in much less than 2 hours. If you do not, wait an hour and count again. After a couple of days you will see a pattern.   What you are looking for is a change in the pattern or not enough counts in 2 hours. Is it taking longer in time to reach 10 movements?  SEEK MEDICAL CARE IF:  You feel less than 10 counts in 2 hours. Tried twice.   No movement in one hour.   The pattern is changing or taking longer each day to reach 10 counts in 2 hours.   You feel the baby is not moving as it usually does.  Date: ____________ Movements: ____________ Start time: ____________ Finish time: ____________  Date: ____________ Movements: ____________ Start time: ____________ Finish time: ____________ Date: ____________ Movements: ____________ Start time: ____________ Finish time: ____________ Date: ____________ Movements: ____________ Start time: ____________ Finish time: ____________ Date: ____________ Movements: ____________ Start time: ____________ Finish time:  ____________ Date: ____________ Movements: ____________ Start time: ____________ Finish time: ____________ Date: ____________ Movements: ____________ Start time: ____________ Finish time: ____________ Date: ____________ Movements: ____________ Start time: ____________ Finish time: ____________  Date: ____________ Movements: ____________ Start time: ____________ Finish time: ____________ Date: ____________ Movements: ____________ Start time: ____________ Finish time: ____________ Date: ____________ Movements: ____________ Start time: ____________ Finish time: ____________ Date: ____________ Movements: ____________ Start time: ____________ Finish time: ____________ Date: ____________ Movements: ____________ Start time: ____________ Finish time: ____________ Date: ____________ Movements: ____________ Start time: ____________ Finish time: ____________ Date: ____________ Movements: ____________ Start time: ____________ Finish time: ____________  Date: ____________ Movements: ____________ Start time: ____________ Finish time: ____________ Date: ____________ Movements: ____________ Start time: ____________ Finish time: ____________ Date: ____________ Movements: ____________ Start time: ____________ Finish time: ____________ Date: ____________ Movements: ____________ Start time: ____________ Finish time: ____________ Date: ____________ Movements: ____________ Start time: ____________ Finish time: ____________ Date: ____________ Movements: ____________ Start time: ____________ Finish time: ____________ Date: ____________ Movements: ____________ Start time: ____________ Finish time: ____________  Date: ____________ Movements: ____________ Start time: ____________ Finish time: ____________ Date: ____________ Movements: ____________ Start time: ____________ Finish time: ____________ Date: ____________ Movements: ____________ Start time: ____________ Finish time: ____________ Date: ____________ Movements:  ____________ Start time: ____________ Finish time: ____________ Date: ____________ Movements: ____________ Start time: ____________ Finish time: ____________ Date: ____________ Movements: ____________ Start time: ____________ Finish time: ____________ Date: ____________ Movements: ____________ Start time: ____________ Finish time: ____________  Date: ____________ Movements: ____________ Start time: ____________ Finish time: ____________ Date: ____________ Movements: ____________ Start time: ____________ Finish time: ____________ Date: ____________ Movements: ____________ Start time:   ____________ Finish time: ____________ Date: ____________ Movements: ____________ Start time: ____________ Finish time: ____________ Date: ____________ Movements: ____________ Start time: ____________ Finish time: ____________ Date: ____________ Movements: ____________ Start time: ____________ Finish time: ____________ Date: ____________ Movements: ____________ Start time: ____________ Finish time: ____________  Date: ____________ Movements: ____________ Start time: ____________ Finish time: ____________ Date: ____________ Movements: ____________ Start time: ____________ Finish time: ____________ Date: ____________ Movements: ____________ Start time: ____________ Finish time: ____________ Date: ____________ Movements: ____________ Start time: ____________ Finish time: ____________ Date: ____________ Movements: ____________ Start time: ____________ Finish time: ____________ Date: ____________ Movements: ____________ Start time: ____________ Finish time: ____________ Date: ____________ Movements: ____________ Start time: ____________ Finish time: ____________  Date: ____________ Movements: ____________ Start time: ____________ Finish time: ____________ Date: ____________ Movements: ____________ Start time: ____________ Finish time: ____________ Date: ____________ Movements: ____________ Start time: ____________ Finish  time: ____________ Date: ____________ Movements: ____________ Start time: ____________ Finish time: ____________ Date: ____________ Movements: ____________ Start time: ____________ Finish time: ____________ Date: ____________ Movements: ____________ Start time: ____________ Finish time: ____________ Date: ____________ Movements: ____________ Start time: ____________ Finish time: ____________  Date: ____________ Movements: ____________ Start time: ____________ Finish time: ____________ Date: ____________ Movements: ____________ Start time: ____________ Finish time: ____________ Date: ____________ Movements: ____________ Start time: ____________ Finish time: ____________ Date: ____________ Movements: ____________ Start time: ____________ Finish time: ____________ Date: ____________ Movements: ____________ Start time: ____________ Finish time: ____________ Date: ____________ Movements: ____________ Start time: ____________ Finish time: ____________ Document Released: 11/15/2006 Document Revised: 10/05/2011 Document Reviewed: 05/18/2009 [employer] Patient Information 2012 [employer].Labor Induction  Most women go into labor on their own between 37 and 42 weeks of the pregnancy. When this does not happen or when there is a medical need, medicine or other methods may be used to induce labor. Labor induction causes a pregnant woman's uterus to contract. It also causes the cervix to soften (ripen), open (dilate), and thin out (efface). Usually, labor is not induced before 39 weeks of the pregnancy unless there is a problem with the baby or mother. Whether your labor will be induced depends on a number of factors, including the following:  The medical condition of you and the baby.   How many weeks along you are.   The status of baby's lung maturity.   The condition of the cervix.   The position of the baby.  REASONS FOR LABOR INDUCTION  The health of the baby or mother is at risk.   The  pregnancy is overdue by 1 week or more.   The water breaks but labor does not start on its own.   The mother has a health condition or serious illness such as high blood pressure, infection, placental abruption, or diabetes.   The amniotic fluid amounts are low around the baby.   The baby is distressed.  REASONS TO NOT INDUCE LABOR Labor induction may not be a good idea if:  It is shown that your baby does not tolerate labor.   An induction is just more convenient.   You want the baby to be born on a certain date, like a holiday.   You have had previous surgeries on your uterus, such as a myomectomy or the removal of fibroids.   Your placenta lies very low in the uterus and blocks the opening of the cervix (placenta previa).   Your baby is not in a head down position.   The umbilical cord drops down into the birth canal in front of the baby. This could cut off the   baby's blood and oxygen supply.   You have had a previous cesarean delivery.   There areunusual circumstances, such as the baby being extremely premature.  RISKS AND COMPLICATIONS Problems may occur in the process of induction and plans may need to be modified as a situation unfolds. Some of the risks of induction include:  Change in fetal heart rate, such as too high, too low, or erratic.   Risk of fetal distress.   Risk of infection to mother and baby.   Increased chance of having a cesarean delivery.   The rare, but increased chance that the placenta will separate from the uterus (abruption).   Uterine rupture (very rare).  When induction is needed for medical reasons, the benefits of induction may outweigh the risks. BEFORE THE PROCEDURE Your caregiver will check your cervix and the baby's position. This will help your caregiver decide if you are far enough along for an induction to work. PROCEDURE Several methods of labor induction may be used, such as:   Taking prostaglandin medicine to dilate and  ripen the cervix. The medicine will also start contractions. It can be taken by mouth or by inserting a suppository into the vagina.   A thin tube (catheter) with a balloon on the end may be inserted into your vagina to dilate the cervix. Once inserted, the balloon expands with water, which causes the cervix to open.   Striping the membranes. Your caregiver inserts a finger between the cervix and membranes, which causes the cervix to be stretched and may cause the uterus to contract. This is often done during an office visit. You will be sent home to wait for the contractions to begin. You will then come in for an induction.   Breaking the water. Your caregiver will make a hole in the amniotic sac using a small instrument. Once the amniotic sac breaks, contractions should begin. This may still take hours to see an effect.   Taking medicine to trigger or strengthen contractions. This medicine is given intravenously through a tube in your arm.  All of the methods of induction, besides stripping the membranes, will be done in the hospital. Induction is done in the hospital so that you and the baby can be carefully monitored. AFTER THE PROCEDURE Some inductions can take up to 2 or 3 days. Depending on the cervix, it usually takes less time. It takes longer when you are induced early in the pregnancy or if this is your first pregnancy. If a mother is still pregnant and the induction has been going on for 2 to 3 days, either the mother will be sent home or a cesarean delivery will be needed. Document Released: 03/07/2007 Document Revised: 10/05/2011 Document Reviewed: 08/21/2011 [employer] Patient Information 2012 [employer]. 

## 2012-03-08 NOTE — MAU Note (Signed)
Diarrhea past 2 days.  Noted when wiping this morning saw bloody/brown- started with the ctx's.  Had irreg ctx's past couple day, more regular and stronger every 6 min.  Was for induction on Monday.

## 2012-03-09 ENCOUNTER — Encounter (HOSPITAL_COMMUNITY): Payer: Self-pay

## 2012-03-09 ENCOUNTER — Inpatient Hospital Stay (HOSPITAL_COMMUNITY): Payer: Medicaid Other | Admitting: Anesthesiology

## 2012-03-09 ENCOUNTER — Encounter (HOSPITAL_COMMUNITY): Payer: Self-pay | Admitting: Obstetrics and Gynecology

## 2012-03-09 ENCOUNTER — Encounter (HOSPITAL_COMMUNITY): Payer: Self-pay | Admitting: Anesthesiology

## 2012-03-09 ENCOUNTER — Inpatient Hospital Stay (HOSPITAL_COMMUNITY)
Admission: AD | Admit: 2012-03-09 | Discharge: 2012-03-12 | DRG: 775 | Disposition: A | Discharge status: Home / Self Care | Payer: Medicaid Other | Source: Ambulatory Visit | Attending: Obstetrics and Gynecology | Admitting: Obstetrics and Gynecology

## 2012-03-09 DIAGNOSIS — O99892 Other specified diseases and conditions complicating childbirth: Secondary | ICD-10-CM | POA: Diagnosis present

## 2012-03-09 DIAGNOSIS — Z3480 Encounter for supervision of other normal pregnancy, unspecified trimester: Secondary | ICD-10-CM

## 2012-03-09 DIAGNOSIS — Z22330 Carrier of Group B streptococcus: Secondary | ICD-10-CM

## 2012-03-09 HISTORY — DX: Encounter for supervision of other normal pregnancy, unspecified trimester: Z34.80

## 2012-03-09 LAB — CBC
MCH: 29.4 pg (ref 26.0–34.0)
MCHC: 33.2 g/dL (ref 30.0–36.0)
MCV: 88.6 fL (ref 78.0–100.0)
Platelets: 207 10*3/uL (ref 150–400)
RDW: 14.3 % (ref 11.5–15.5)
WBC: 8.8 10*3/uL (ref 4.0–10.5)

## 2012-03-09 MED ORDER — FENTANYL 2.5 MCG/ML BUPIVACAINE 1/10 % EPIDURAL INFUSION (WH - ANES)
14.0000 mL/h | INTRAMUSCULAR | Status: DC
  Administered 2012-03-10: 14 mL/h via EPIDURAL
  Filled 2012-03-09 (×2): qty 60

## 2012-03-09 MED ORDER — OXYCODONE-ACETAMINOPHEN 5-325 MG PO TABS: 1.0000 | ORAL_TABLET | ORAL | Status: DC | PRN

## 2012-03-09 MED ORDER — IBUPROFEN 600 MG PO TABS: 600.0000 mg | ORAL_TABLET | Freq: Four times a day (QID) | ORAL | Status: DC | PRN

## 2012-03-09 MED ORDER — TERBUTALINE SULFATE 1 MG/ML IJ SOLN: 0.2500 mg | Freq: Once | INTRAMUSCULAR | Status: AC | PRN

## 2012-03-09 MED ORDER — FLEET ENEMA 7-19 GM/118ML RE ENEM: 1.0000 | ENEMA | RECTAL | Status: DC | PRN

## 2012-03-09 MED ORDER — LIDOCAINE HCL (PF) 1 % IJ SOLN
30.0000 mL | INTRAMUSCULAR | Status: DC | PRN
  Filled 2012-03-09: qty 30

## 2012-03-09 MED ORDER — LACTATED RINGERS IV SOLN: 500.0000 mL | INTRAVENOUS | Status: DC | PRN

## 2012-03-09 MED ORDER — EPHEDRINE 5 MG/ML INJ
10.0000 mg | INTRAVENOUS | Status: DC | PRN
  Filled 2012-03-09: qty 4

## 2012-03-09 MED ORDER — PENICILLIN G POTASSIUM 5000000 UNITS IJ SOLR
2.5000 10*6.[IU] | INTRAVENOUS | Status: DC
  Administered 2012-03-10: 2.5 10*6.[IU] via INTRAVENOUS
  Filled 2012-03-09 (×4): qty 2.5

## 2012-03-09 MED ORDER — CITRIC ACID-SODIUM CITRATE 334-500 MG/5ML PO SOLN: 30.0000 mL | ORAL | Status: DC | PRN

## 2012-03-09 MED ORDER — LIDOCAINE HCL (PF) 1 % IJ SOLN
INTRAMUSCULAR | Status: DC | PRN
  Administered 2012-03-09 (×2): 4 mL

## 2012-03-09 MED ORDER — DIPHENHYDRAMINE HCL 50 MG/ML IJ SOLN: 12.5000 mg | INTRAMUSCULAR | Status: DC | PRN

## 2012-03-09 MED ORDER — PENICILLIN G POTASSIUM 5000000 UNITS IJ SOLR
5.0000 10*6.[IU] | Freq: Once | INTRAVENOUS | Status: AC
  Administered 2012-03-09: 5 10*6.[IU] via INTRAVENOUS
  Filled 2012-03-09: qty 5

## 2012-03-09 MED ORDER — FENTANYL 2.5 MCG/ML BUPIVACAINE 1/10 % EPIDURAL INFUSION (WH - ANES)
INTRAMUSCULAR | Status: DC | PRN
  Administered 2012-03-09: 14 mL/h via EPIDURAL

## 2012-03-09 MED ORDER — PHENYLEPHRINE 40 MCG/ML (10ML) SYRINGE FOR IV PUSH (FOR BLOOD PRESSURE SUPPORT): 80.0000 ug | PREFILLED_SYRINGE | INTRAVENOUS | Status: DC | PRN

## 2012-03-09 MED ORDER — LACTATED RINGERS IV SOLN: 500.0000 mL | Freq: Once | INTRAVENOUS | Status: DC

## 2012-03-09 MED ORDER — OXYTOCIN 20 UNITS IN LACTATED RINGERS INFUSION - SIMPLE: 125.0000 mL/h | Freq: Once | INTRAVENOUS | Status: DC

## 2012-03-09 MED ORDER — LACTATED RINGERS IV SOLN
INTRAVENOUS | Status: DC
  Administered 2012-03-09 (×2): via INTRAVENOUS

## 2012-03-09 MED ORDER — PHENYLEPHRINE 40 MCG/ML (10ML) SYRINGE FOR IV PUSH (FOR BLOOD PRESSURE SUPPORT)
80.0000 ug | PREFILLED_SYRINGE | INTRAVENOUS | Status: DC | PRN
  Filled 2012-03-09: qty 5

## 2012-03-09 MED ORDER — ONDANSETRON HCL 4 MG/2ML IJ SOLN: 4.0000 mg | Freq: Four times a day (QID) | INTRAMUSCULAR | Status: DC | PRN

## 2012-03-09 MED ORDER — OXYTOCIN 20 UNITS IN LACTATED RINGERS INFUSION - SIMPLE
1.0000 m[IU]/min | INTRAVENOUS | Status: DC
  Filled 2012-03-09: qty 1000

## 2012-03-09 MED ORDER — EPHEDRINE 5 MG/ML INJ: 10.0000 mg | INTRAVENOUS | Status: DC | PRN

## 2012-03-09 MED ORDER — OXYTOCIN BOLUS FROM INFUSION
500.0000 mL | Freq: Once | INTRAVENOUS | Status: DC
  Filled 2012-03-09: qty 500

## 2012-03-09 MED ORDER — ACETAMINOPHEN 325 MG PO TABS: 650.0000 mg | ORAL_TABLET | ORAL | Status: DC | PRN

## 2012-03-09 MED ORDER — BUTORPHANOL TARTRATE 2 MG/ML IJ SOLN: 2.0000 mg | INTRAMUSCULAR | Status: DC | PRN

## 2012-03-09 NOTE — H&P (Signed)
Margaret Knight is a 26 y.o. female [phone] at 39+ with onset of labor, evaluated Friday in MAU with no cervical change, presents today at 6cm.  Largely uncomplicated PNC, gbbs+.  +FM, no LOF, no VB, ctx, increasing in frequency and intensity Maternal Medical History:  Reason for admission: Reason for admission: contractions.  Contractions: Frequency: regular.   Perceived severity is strong.    Fetal activity: Perceived fetal activity is normal.      OB History    Grav Para Term Preterm Abortions TAB SAB Ect Mult Living   6 2 2  3 2 1   2     G1 TAB, G2 SVD female 7#14, G3 TAB, G4 SVD female 6#, G5 SAB, G6 present, no abn pap, no STDs  Past Medical History  Diagnosis Date  . Migraines   . History of recurrent UTI (urinary tract infection)   . Normal pregnancy, repeat 03/09/2012   Past Surgical History  Procedure Date  . No past surgeries    Family History: family history includes Cancer in her mother and Diabetes in her maternal grandmother and paternal grandmother.  There is no history of Anesthesia problems. Social History:  reports that she has never smoked. She has never used smokeless tobacco. She reports that she does not drink alcohol or use illicit drugs. Meds none All NKDA  Review of Systems  Constitutional: Negative.   HENT: Negative.   Eyes: Negative.   Respiratory: Negative.   Cardiovascular: Negative.   Gastrointestinal: Negative.   Genitourinary: Negative.   Musculoskeletal: Negative.   Skin: Negative.   Neurological: Negative.   Psychiatric/Behavioral: Negative.     Dilation: 6 Effacement (%): 50 Station: -3 Exam by:: Margaret Knight Blood pressure 107/69, pulse 99, temperature 98.9 F (37.2 C), temperature source Oral, resp. rate 20, height 5\' 7"  (1.702 m), weight 74.844 kg (165 lb), last menstrual period 05/08/2011. Maternal Exam:  Abdomen: Fundal height is appropriatefor gestation.   Fetal presentation: vertex     Physical Exam  Constitutional:  She is oriented to person, place, and time. She appears well-developed and well-nourished.  HENT:  Head: Normocephalic and atraumatic.  Neck: Normal range of motion. Neck supple. No thyromegaly present.  Cardiovascular: Normal rate and regular rhythm.   Respiratory: Effort normal and breath sounds normal. No respiratory distress.  GI: Soft. Bowel sounds are normal. There is no tenderness.  Musculoskeletal: Normal range of motion.  Neurological: She is alert and oriented to person, place, and time.  Skin: Skin is warm and dry.  Psychiatric: She has a normal mood and affect. Her behavior is normal.    Prenatal labs: ABO, Rh: O/Positive/-- (11/12 0000) Antibody: Negative (11/12 0000) Rubella: Immune (11/12 0000) RPR: Nonreactive (11/12 0000)  HBsAg: Negative (11/12 0000)  HIV: Non-reactive (11/12 0000)  GBS: Positive (04/18 0000)  Hgb 11.9/ Pap WNL, Plt 389K/ Hgb electro WNL/ GC neg/ Chl neg/CF neg/ First Tri Scr WNL/ glucola 104  [place] first tri - cwd EDC 03/14/12 [place] nl anat, post plac, female  Assessment/Plan: 14NW [id number] at 39+ in active labor gbbs +, PCN for prophylaxis Augment with pitocin prn Epidural for pain, prn Expect SVD   Margaret Knight 03/09/2012, 10:38 PM

## 2012-03-09 NOTE — Anesthesia Procedure Notes (Signed)
Epidural Patient location during procedure: OB Start time: 03/09/2012 10:56 PM  Staffing Anesthesiologist: Shaheed Schmuck A. Performed by: anesthesiologist   Preanesthetic Checklist Completed: patient identified, site marked, surgical consent, pre-op evaluation, timeout performed, IV checked, risks and benefits discussed and monitors and equipment checked  Epidural Patient position: sitting Prep: site prepped and draped and DuraPrep Patient monitoring: continuous pulse ox and blood pressure Approach: midline Injection technique: LOR air  Needle:  Needle type: Tuohy  Needle gauge: 17 G Needle length: 9 cm Needle insertion depth: 5 cm cm Catheter type: closed end flexible Catheter size: 19 Gauge Catheter at skin depth: 10 cm Test dose: negative and Other  Assessment Events: blood not aspirated, injection not painful, no injection resistance, negative IV test and no paresthesia  Additional Notes Patient identified. Risks and benefits discussed including failed block, incomplete  Pain control, post dural puncture headache, nerve damage, paralysis, blood pressure Changes, nausea, vomiting, reactions to medications-both toxic and allergic and post Partum back pain. All questions were answered. Patient expressed understanding and wished to proceed. Sterile technique was used throughout procedure. Epidural site was Dressed with sterile barrier dressing. No paresthesias, signs of intravascular injection Or signs of intrathecal spread were encountered.  Patient was more comfortable after the epidural was dosed. Please see RN's note for documentation of vital signs and FHR which are stable.

## 2012-03-09 NOTE — Anesthesia Preprocedure Evaluation (Signed)

## 2012-03-10 ENCOUNTER — Encounter (HOSPITAL_COMMUNITY): Payer: Self-pay | Admitting: Obstetrics and Gynecology

## 2012-03-10 MED ORDER — ONDANSETRON HCL 4 MG/2ML IJ SOLN: 4.0000 mg | INTRAMUSCULAR | Status: DC | PRN

## 2012-03-10 MED ORDER — SENNOSIDES-DOCUSATE SODIUM 8.6-50 MG PO TABS
2.0000 | ORAL_TABLET | Freq: Every day | ORAL | Status: DC
  Administered 2012-03-10: 2 via ORAL

## 2012-03-10 MED ORDER — LACTATED RINGERS IV SOLN: INTRAVENOUS | Status: DC

## 2012-03-10 MED ORDER — ONDANSETRON HCL 4 MG PO TABS: 4.0000 mg | ORAL_TABLET | ORAL | Status: DC | PRN

## 2012-03-10 MED ORDER — WITCH HAZEL-GLYCERIN EX PADS: 1.0000 "application " | MEDICATED_PAD | CUTANEOUS | Status: DC | PRN

## 2012-03-10 MED ORDER — IBUPROFEN 600 MG PO TABS
600.0000 mg | ORAL_TABLET | Freq: Four times a day (QID) | ORAL | Status: DC
  Administered 2012-03-10 – 2012-03-12 (×5): 600 mg via ORAL
  Filled 2012-03-10 (×10): qty 1

## 2012-03-10 MED ORDER — DIPHENHYDRAMINE HCL 25 MG PO CAPS: 25.0000 mg | ORAL_CAPSULE | Freq: Four times a day (QID) | ORAL | Status: DC | PRN

## 2012-03-10 MED ORDER — LANOLIN HYDROUS EX OINT: TOPICAL_OINTMENT | CUTANEOUS | Status: DC | PRN

## 2012-03-10 MED ORDER — DIBUCAINE 1 % RE OINT: 1.0000 "application " | TOPICAL_OINTMENT | RECTAL | Status: DC | PRN

## 2012-03-10 MED ORDER — BENZOCAINE-MENTHOL 20-0.5 % EX AERO: 1.0000 "application " | INHALATION_SPRAY | CUTANEOUS | Status: DC | PRN

## 2012-03-10 MED ORDER — ZOLPIDEM TARTRATE 5 MG PO TABS: 5.0000 mg | ORAL_TABLET | Freq: Every evening | ORAL | Status: DC | PRN

## 2012-03-10 MED ORDER — TETANUS-DIPHTH-ACELL PERTUSSIS 5-2.5-18.5 LF-MCG/0.5 IM SUSP
0.5000 mL | Freq: Once | INTRAMUSCULAR | Status: DC
  Filled 2012-03-10: qty 0.5

## 2012-03-10 MED ORDER — OXYCODONE-ACETAMINOPHEN 5-325 MG PO TABS
1.0000 | ORAL_TABLET | ORAL | Status: DC | PRN
  Administered 2012-03-10 – 2012-03-12 (×4): 1 via ORAL
  Filled 2012-03-10 (×4): qty 1

## 2012-03-10 MED ORDER — SIMETHICONE 80 MG PO CHEW: 80.0000 mg | CHEWABLE_TABLET | ORAL | Status: DC | PRN

## 2012-03-10 MED ORDER — PRENATAL MULTIVITAMIN CH
1.0000 | ORAL_TABLET | Freq: Every day | ORAL | Status: DC
  Administered 2012-03-10 – 2012-03-12 (×3): 1 via ORAL
  Filled 2012-03-10 (×3): qty 1

## 2012-03-10 NOTE — Progress Notes (Signed)
Post Partum Day 0 Subjective: no complaints, up ad lib, tolerating PO and pain controlled, nl lochia  Objective: Blood pressure 110/72, pulse 91, temperature 98.8 F (37.1 C), temperature source Oral, resp. rate 18, height 5\' 7"  (1.702 m), weight 74.844 kg (165 lb), last menstrual period 05/08/2011, SpO2 99.00%, unknown if currently breastfeeding.  Physical Exam:  General: alert and no distress Lochia: appropriate Uterine Fundus: firm   Basename 03/09/12 2209  HGB 10.6*  HCT 31.9*    Assessment/Plan: Plan for discharge tomorrow or Tuesday, routine care.  Doing well.     LOS: 1 day   [name],Margaret Knight 03/10/2012, 11:09 AM

## 2012-03-10 NOTE — Clinical Social Work Note (Signed)
SW received consult due to pt having a history of anxiety.  Pt refused to speak with SW regarding this matter.  SW informed unit RN, Tamela Oddi, and encouraged RN to contact SW should pt wish to discuss.

## 2012-03-10 NOTE — Anesthesia Postprocedure Evaluation (Signed)
  Anesthesia Post-op Note  Patient: Margaret Knight  Procedure(s) Performed: * No procedures listed *  Patient Location: PACU and Cath Lab  Anesthesia Type: Epidural  Level of Consciousness: awake, alert  and oriented  Airway and Oxygen Therapy: Patient Spontanous Breathing   Post-op Assessment: Patient's Cardiovascular Status Stable and Respiratory Function Stable  Post-op Vital Signs: stable  Complications: No apparent anesthesia complications

## 2012-03-10 NOTE — Progress Notes (Signed)
Patient ID: Margaret Knight, female   DOB: [birth year], 26 y.o.   MRN: [id number]  Pt has received PCN for 4 hours, will perform SROM, Pt also complete. 130's mod var toco q2-62min Anticipate SVD

## 2012-03-11 ENCOUNTER — Inpatient Hospital Stay (HOSPITAL_COMMUNITY): Admission: RE | Admit: 2012-03-11 | Payer: Medicaid Other | Source: Ambulatory Visit

## 2012-03-11 LAB — CBC
HCT: 30.6 % — ABNORMAL LOW (ref 36.0–46.0)
Hemoglobin: 10 g/dL — ABNORMAL LOW (ref 12.0–15.0)
MCHC: 32.7 g/dL (ref 30.0–36.0)
MCV: 89.2 fL (ref 78.0–100.0)
WBC: 15.7 10*3/uL — ABNORMAL HIGH (ref 4.0–10.5)

## 2012-03-11 NOTE — Progress Notes (Signed)
UR chart review completed.  

## 2012-03-11 NOTE — Progress Notes (Signed)
Post Partum Day 1 Subjective: no complaints, tolerating PO and nl lochia, pain controlled.  Pt c/o more cramping with this delivery than previous.    Objective: Blood pressure 113/73, pulse 76, temperature 98.5 F (36.9 C), temperature source Oral, resp. rate 18, height 5\' 7"  (1.702 m), weight 74.844 kg (165 lb), last menstrual period 05/08/2011, SpO2 99.00%, unknown if currently breastfeeding.  Physical Exam:  General: alert and no distress Lochia: appropriate Uterine Fundus: firm   Basename 03/11/12 0530 03/09/12 2209  HGB 10.0* 10.6*  HCT 30.6* 31.9*    Assessment/Plan: Plan for discharge tomorrow, Breastfeeding and Lactation consult  Routine care   LOS: 2 days   [name],Evaleen Sant 03/11/2012, 8:01 AM

## 2012-03-12 MED ORDER — OXYCODONE-ACETAMINOPHEN 5-325 MG PO TABS: 1.0000 | ORAL_TABLET | Freq: Four times a day (QID) | ORAL | Status: AC | PRN

## 2012-03-12 MED ORDER — IBUPROFEN 800 MG PO TABS: 800.0000 mg | ORAL_TABLET | Freq: Three times a day (TID) | ORAL | Status: AC | PRN

## 2012-03-12 NOTE — Progress Notes (Signed)
Post Partum Day 2 Subjective: no complaints, up ad lib, tolerating PO and nl lochia, pain controlled, some cramping  Objective: Blood pressure 90/56, pulse 72, temperature 98.2 F (36.8 C), temperature source Oral, resp. rate 18, height 5\' 7"  (1.702 m), weight 74.844 kg (165 lb), last menstrual period 05/08/2011, SpO2 98.00%, unknown if currently breastfeeding.  Physical Exam:  General: alert and no distress Lochia: appropriate Uterine Fundus: firm   Basename 03/11/12 0530 03/09/12 2209  HGB 10.0* 10.6*  HCT 30.6* 31.9*    Assessment/Plan: Discharge home.  D/c with motrin/ percocet/ pnv.  F/u 6 weeks   LOS: 3 days   [name],Sandy Blouch 03/12/2012, 9:55 AM

## 2012-03-12 NOTE — Discharge Summary (Signed)
Obstetric Discharge Summary Reason for Admission: onset of labor Prenatal Procedures: none Intrapartum Procedures: spontaneous vaginal delivery Postpartum Procedures: none Complications-Operative and Postpartum: 1st degree perineal laceration Hemoglobin  Date Value Range Status  03/11/2012 10.0* 12.0-15.0 (g/dL) Final     HCT  Date Value Range Status  03/11/2012 30.6* 36.0-46.0 (%) Final    Physical Exam:  General: alert and no distress Lochia: appropriate Uterine Fundus: firm  Discharge Diagnoses: Term Pregnancy-delivered  Discharge Information: Date: 03/12/2012 Activity: pelvic rest Diet: routine Medications: PNV, Ibuprofen and Percocet Condition: stable Instructions: refer to practice specific booklet Discharge to: home Follow-up Information    Follow up with [name],Margaret Blunck, MD. Schedule an appointment as soon as possible for a visit in 6 weeks.   Contact information:   [address] [facility] [address] [place] [address] [phone]          Newborn Data: Live born female  Birth Weight: 7 lb 13.4 oz (3555 g) APGAR: 9, 9  Home with mother.  [name],Margaret Knight 03/12/2012, 10:00 AM

## 2012-12-23 ENCOUNTER — Encounter (HOSPITAL_COMMUNITY): Payer: Self-pay | Admitting: Emergency Medicine

## 2012-12-23 ENCOUNTER — Emergency Department (HOSPITAL_COMMUNITY)
Admission: EM | Admit: 2012-12-23 | Discharge: 2012-12-23 | Disposition: A | Discharge status: Home Health | Payer: Medicaid Other | Attending: Emergency Medicine | Admitting: Emergency Medicine

## 2012-12-23 DIAGNOSIS — Z8679 Personal history of other diseases of the circulatory system: Secondary | ICD-10-CM | POA: Insufficient documentation

## 2012-12-23 DIAGNOSIS — Z87440 Personal history of urinary (tract) infections: Secondary | ICD-10-CM | POA: Insufficient documentation

## 2012-12-23 DIAGNOSIS — Z3202 Encounter for pregnancy test, result negative: Secondary | ICD-10-CM | POA: Insufficient documentation

## 2012-12-23 DIAGNOSIS — R197 Diarrhea, unspecified: Secondary | ICD-10-CM | POA: Insufficient documentation

## 2012-12-23 DIAGNOSIS — R1110 Vomiting, unspecified: Secondary | ICD-10-CM | POA: Insufficient documentation

## 2012-12-23 DIAGNOSIS — Z3480 Encounter for supervision of other normal pregnancy, unspecified trimester: Secondary | ICD-10-CM | POA: Insufficient documentation

## 2012-12-23 DIAGNOSIS — R1084 Generalized abdominal pain: Secondary | ICD-10-CM | POA: Insufficient documentation

## 2012-12-23 DIAGNOSIS — K5289 Other specified noninfective gastroenteritis and colitis: Secondary | ICD-10-CM | POA: Insufficient documentation

## 2012-12-23 LAB — URINALYSIS, ROUTINE W REFLEX MICROSCOPIC
Bilirubin Urine: NEGATIVE
Glucose, UA: NEGATIVE mg/dL
Hgb urine dipstick: NEGATIVE
Specific Gravity, Urine: 1.021 (ref 1.005–1.030)

## 2012-12-23 LAB — CBC WITH DIFFERENTIAL/PLATELET
Basophils Relative: 0 % (ref 0–1)
Eosinophils Absolute: 0 10*3/uL (ref 0.0–0.7)
Hemoglobin: 13.5 g/dL (ref 12.0–15.0)
MCH: 29.4 pg (ref 26.0–34.0)
MCHC: 32.9 g/dL (ref 30.0–36.0)
Monocytes Relative: 6 % (ref 3–12)
Neutrophils Relative %: 64 % (ref 43–77)
Platelets: 392 10*3/uL (ref 150–400)
RDW: 13.2 % (ref 11.5–15.5)

## 2012-12-23 LAB — URINE MICROSCOPIC-ADD ON

## 2012-12-23 LAB — COMPREHENSIVE METABOLIC PANEL
ALT: 21 U/L (ref 0–35)
Alkaline Phosphatase: 67 U/L (ref 39–117)
CO2: 23 mEq/L (ref 19–32)
Chloride: 102 mEq/L (ref 96–112)
GFR calc Af Amer: >90 mL/min (ref >90)
GFR calc non Af Amer: >90 mL/min (ref >90)
Glucose, Bld: 102 mg/dL — ABNORMAL HIGH (ref 70–99)
Potassium: 3.9 mEq/L (ref 3.5–5.1)
Sodium: 135 mEq/L (ref 135–145)
Total Bilirubin: 0.4 mg/dL (ref 0.3–1.2)

## 2012-12-23 NOTE — ED Provider Notes (Signed)
I saw and evaluated the patient, reviewed the resident's note and I agree with the findings and plan. The patient presents here with generalized abd discomfort, n/v/d for the past day and a half.  All non-bloody.  No fevers or chills.  Daughter is ill in a similar fashion.  On exam, the vitals are stable and the patient is afebrile.  The heart and lung exam is unremarkable.  The abdomen shows mild ttp in all four quadrants but no rebound or guarding.  The mucous membranes are moist.    Workup reveals normal labs and she is feeling better.   Will discharge with antiemetics, return prn.  Geoffery Lyons, MD 12/23/12 1556

## 2012-12-23 NOTE — ED Provider Notes (Signed)
History     CSN: [id number]  Arrival date & time 12/23/12  4098   None     Chief Complaint  Patient presents with  . Emesis  . Diarrhea    HPI Comments: 27 y.o PMH migraines presents for symptoms of burping yesterday. She vomited x 1 today and 3 episodes of diarrhea.  She went to a cook out Saturday and ate meat which she normally does not.  She has generalized sharp pains in her abdomen 10/10 non radiating, intermittent.  Movement makes worse.  Of note her daughter in kindergarden was sick with a stomach bug last week and patient notes she was vomiting.    PsuH: denies  FH: mother cancer SH: not sexually active, no alcohol, no cigarettes. She has 3 kids ages 4, 55, 51 months.    Patient is a 27 y.o. female presenting with vomiting and diarrhea. The history is provided by the patient. No language interpreter was used.  Emesis Number of daily episodes:  1 today Chronicity:  New Recent urination:  Normal Relieved by:  None tried Ineffective treatments:  None tried Associated symptoms: abdominal pain and diarrhea   Associated symptoms: no chills, no fever and no sore throat   Risk factors: sick contacts and suspect food intake   Risk factors: no alcohol use   Diarrhea Associated symptoms: abdominal pain and vomiting   Associated symptoms: no chills and no fever     Past Medical History  Diagnosis Date  . Migraines   . History of recurrent UTI (urinary tract infection)   . Normal pregnancy, repeat 03/09/2012  . SVD (spontaneous vaginal delivery) 03/10/2012    Past Surgical History  Procedure Laterality Date  . No past surgeries      Family History  Problem Relation Age of Onset  . Anesthesia problems Neg Hx   . Cancer Mother     breast  . Diabetes Maternal Grandmother   . Diabetes Paternal Grandmother     History  Substance Use Topics  . Smoking status: Never Smoker   . Smokeless tobacco: Never Used  . Alcohol Use: No    OB History   Grav Para Term Preterm  Abortions TAB SAB Ect Mult Living   7 3 3  3 2 1   3       Review of Systems  Constitutional: Negative for fever and chills.  HENT: Negative for sore throat.   Cardiovascular: Negative for leg swelling.  Gastrointestinal: Positive for vomiting, abdominal pain, diarrhea and abdominal distention.  Genitourinary: Negative for dysuria, vaginal discharge and difficulty urinating.  All other systems reviewed and are negative.    Allergies  Review of patient's allergies indicates no known allergies.  Home Medications  No current outpatient prescriptions on file.  BP 119/77  Pulse 76  Temp(Src) 98.3 F (36.8 C) (Oral)  Resp 16  Ht 5\' 7"  (1.702 m)  Wt 154 lb (69.854 kg)  BMI 24.11 kg/m2  SpO2 100%  LMP 11/30/2012  Physical Exam  Nursing note and vitals reviewed. Constitutional: She is oriented to person, place, and time. Vital signs are normal. She appears well-developed and well-nourished. She is cooperative. No distress.  HENT:  Head: Normocephalic and atraumatic.  Mouth/Throat: Oropharynx is clear and moist and mucous membranes are normal. No oropharyngeal exudate.  Eyes: Conjunctivae are normal. Pupils are equal, round, and reactive to light. Right eye exhibits no discharge. Left eye exhibits no discharge. No scleral icterus.  Cardiovascular: Normal rate, regular rhythm, S1 normal, S2  normal and normal heart sounds.   No murmur heard. Pulmonary/Chest: Effort normal and breath sounds normal. No respiratory distress. She has no wheezes.  Abdominal: Soft. Bowel sounds are normal.    Musculoskeletal:  No lower extremity edema   Neurological: She is alert and oriented to person, place, and time. She has normal strength.  Skin: Skin is warm, dry and intact. No rash noted. She is not diaphoretic.  Psychiatric: She has a normal mood and affect. Her speech is normal and behavior is normal. Judgment and thought content normal. Cognition and memory are normal.    ED Course    Procedures (including critical care time)  Labs Reviewed  COMPREHENSIVE METABOLIC PANEL - Abnormal; Notable for the following:    Glucose, Bld 102 (*)    Total Protein 8.5 (*)    All other components within normal limits  CBC WITH DIFFERENTIAL  URINALYSIS, ROUTINE W REFLEX MICROSCOPIC  LIPASE, BLOOD   No results found.   1. Diarrhea   2. Emesis   3. Abdominal pain   4. Gastroenteritis       MDM  Symptoms likely gastroenteritis  CMET, lipase, UA, urine pregnancy   Shirlee Latch MD [phone]       Annett Gula, MD 12/23/12 0945  Annett Gula, MD 12/23/12 0102  Annett Gula, MD 12/23/12 [phone]

## 2012-12-23 NOTE — ED Notes (Signed)
Patient complains "i started burping yesterday and it was bad".  Patient said she started throwing up about 0400 this morning.

## 2013-03-19 ENCOUNTER — Inpatient Hospital Stay (HOSPITAL_COMMUNITY)
Admission: AD | Admit: 2013-03-19 | Discharge: 2013-03-19 | Discharge status: Against Medical Advice | Payer: Medicaid Other | Source: Ambulatory Visit | Attending: Obstetrics and Gynecology | Admitting: Obstetrics and Gynecology

## 2013-03-19 ENCOUNTER — Inpatient Hospital Stay (HOSPITAL_COMMUNITY): Payer: Medicaid Other

## 2013-03-19 DIAGNOSIS — O209 Hemorrhage in early pregnancy, unspecified: Secondary | ICD-10-CM | POA: Insufficient documentation

## 2013-03-19 DIAGNOSIS — O2390 Unspecified genitourinary tract infection in pregnancy, unspecified trimester: Secondary | ICD-10-CM | POA: Insufficient documentation

## 2013-03-19 DIAGNOSIS — N390 Urinary tract infection, site not specified: Secondary | ICD-10-CM | POA: Insufficient documentation

## 2013-03-19 DIAGNOSIS — O4690 Antepartum hemorrhage, unspecified, unspecified trimester: Secondary | ICD-10-CM

## 2013-03-19 LAB — URINALYSIS, ROUTINE W REFLEX MICROSCOPIC
Bilirubin Urine: NEGATIVE
Glucose, UA: NEGATIVE mg/dL
Hgb urine dipstick: NEGATIVE
Protein, ur: NEGATIVE mg/dL

## 2013-03-19 LAB — URINE MICROSCOPIC-ADD ON

## 2013-03-19 LAB — CBC
HCT: 44.7 % (ref 36.0–46.0)
MCH: 30.2 pg (ref 26.0–34.0)
MCV: 115.5 fL — ABNORMAL HIGH (ref 78.0–100.0)
RDW: 13 % (ref 11.5–15.5)
WBC: 7.2 10*3/uL (ref 4.0–10.5)

## 2013-03-19 MED ORDER — CEPHALEXIN 500 MG PO CAPS: 500.0000 mg | ORAL_CAPSULE | Freq: Three times a day (TID) | ORAL | Status: DC

## 2013-03-19 NOTE — MAU Note (Signed)
Pt reports she is having bleeding and cramping. LMP 01/01/2014m has irregular periods

## 2013-03-19 NOTE — MAU Note (Signed)
Attempt to call pt at # listed on sign in

## 2013-03-19 NOTE — MAU Note (Signed)
2230 pt not in lobby. Everything resulted but unable to find pt for NP to talk to her, phone call to # listed and message left.

## 2013-03-19 NOTE — MAU Note (Signed)
Call to lab, no urine results yet, will call back.

## 2013-03-19 NOTE — MAU Note (Signed)
RN to lab, still no urine results. States urine is now in process.

## 2013-03-19 NOTE — MAU Provider Note (Signed)
History     CSN: [id number]  Arrival date and time: 03/19/13 2002   None     Chief Complaint  Patient presents with  . Vaginal Bleeding  . Abdominal Pain  . Possible Pregnancy   HPI  Margaret Knight is a 27 y.o. [phone] who presents today with uncertain EGA and bleeding and cramping.   Past Medical History  Diagnosis Date  . Migraines   . History of recurrent UTI (urinary tract infection)   . Normal pregnancy, repeat 03/09/2012  . SVD (spontaneous vaginal delivery) 03/10/2012    Past Surgical History  Procedure Laterality Date  . No past surgeries      Family History  Problem Relation Age of Onset  . Anesthesia problems Neg Hx   . Cancer Mother     breast  . Diabetes Maternal Grandmother   . Diabetes Paternal Grandmother     History  Substance Use Topics  . Smoking status: Never Smoker   . Smokeless tobacco: Never Used  . Alcohol Use: No    Allergies: No Known Allergies  No prescriptions prior to admission    ROS Physical Exam   Blood pressure 113/70, pulse 61, temperature 98.6 F (37 C), temperature source Oral, resp. rate 18, height 5' 6.5" (1.689 m), weight 68.04 kg (150 lb), last menstrual period 10/30/2012, SpO2 100.00%.  Physical Exam  MAU Course  Procedures  Results for orders placed during the hospital encounter of 03/19/13 (from the past 24 hour(s))  CBC     Status: Abnormal   Collection Time    03/19/13  8:15 PM      Result Value Range   WBC 7.2  4.0 - 10.5 K/uL   RBC 3.87  3.87 - 5.11 MIL/uL   Hemoglobin 11.7 (*) 12.0 - 15.0 g/dL   HCT 13.0  86.5 - 78.4 %   MCV 115.5 (*) 78.0 - 100.0 fL   MCH 30.2  26.0 - 34.0 pg   MCHC 26.2 (*) 30.0 - 36.0 g/dL   RDW 69.6  29.5 - 28.4 %   Platelets 421 (*) 150 - 400 K/uL  HCG, QUANTITATIVE, PREGNANCY     Status: Abnormal   Collection Time    03/19/13  8:15 PM      Result Value Range   hCG, Beta Chain, Quant, [place] [address] (*) <5 mIU/mL  URINALYSIS, ROUTINE W REFLEX MICROSCOPIC     Status: Abnormal    Collection Time    03/19/13  8:45 PM      Result Value Range   Color, Urine YELLOW  YELLOW   APPearance HAZY (*) CLEAR   Specific Gravity, Urine 1.025  1.005 - 1.030   pH 6.0  5.0 - 8.0   Glucose, UA NEGATIVE  NEGATIVE mg/dL   Hgb urine dipstick NEGATIVE  NEGATIVE   Bilirubin Urine NEGATIVE  NEGATIVE   Ketones, ur NEGATIVE  NEGATIVE mg/dL   Protein, ur NEGATIVE  NEGATIVE mg/dL   Urobilinogen, UA 0.2  0.0 - 1.0 mg/dL   Nitrite POSITIVE (*) NEGATIVE   Leukocytes, UA NEGATIVE  NEGATIVE  URINE MICROSCOPIC-ADD ON     Status: Abnormal   Collection Time    03/19/13  8:45 PM      Result Value Range   Squamous Epithelial / LPF RARE  RARE   WBC, UA 0-2  <3 WBC/hpf   RBC / HPF 0-2  <3 RBC/hpf   Bacteria, UA MANY (*) RARE   Crystals CA OXALATE CRYSTALS (*) NEGATIVE  Assessment and Plan   1. Antepartum bleeding, first trimester   2. UTI (lower urinary tract infection)    Patient left without being seen for discussion of lab results.  Left a message for her to call back. Will need to be informed of UTI and RX sent to pharmacy when she calls  Tawnya Crook 03/19/2013, 10:38 PM

## 2013-03-20 ENCOUNTER — Inpatient Hospital Stay (HOSPITAL_COMMUNITY)
Admission: AD | Admit: 2013-03-20 | Discharge: 2013-03-20 | Disposition: A | Discharge status: Home / Self Care | Payer: Medicaid Other | Source: Ambulatory Visit | Attending: Obstetrics & Gynecology | Admitting: Obstetrics & Gynecology

## 2013-03-20 ENCOUNTER — Encounter (HOSPITAL_COMMUNITY): Payer: Self-pay

## 2013-03-20 DIAGNOSIS — N390 Urinary tract infection, site not specified: Secondary | ICD-10-CM | POA: Insufficient documentation

## 2013-03-20 DIAGNOSIS — R109 Unspecified abdominal pain: Secondary | ICD-10-CM | POA: Insufficient documentation

## 2013-03-20 DIAGNOSIS — B9689 Other specified bacterial agents as the cause of diseases classified elsewhere: Secondary | ICD-10-CM | POA: Insufficient documentation

## 2013-03-20 DIAGNOSIS — A499 Bacterial infection, unspecified: Secondary | ICD-10-CM | POA: Insufficient documentation

## 2013-03-20 DIAGNOSIS — N949 Unspecified condition associated with female genital organs and menstrual cycle: Secondary | ICD-10-CM | POA: Insufficient documentation

## 2013-03-20 DIAGNOSIS — R350 Frequency of micturition: Secondary | ICD-10-CM | POA: Insufficient documentation

## 2013-03-20 DIAGNOSIS — O2390 Unspecified genitourinary tract infection in pregnancy, unspecified trimester: Secondary | ICD-10-CM

## 2013-03-20 DIAGNOSIS — O2341 Unspecified infection of urinary tract in pregnancy, first trimester: Secondary | ICD-10-CM

## 2013-03-20 DIAGNOSIS — N760 Acute vaginitis: Secondary | ICD-10-CM | POA: Insufficient documentation

## 2013-03-20 LAB — WET PREP, GENITAL: Yeast Wet Prep HPF POC: NONE SEEN

## 2013-03-20 MED ORDER — METRONIDAZOLE 500 MG PO TABS: 500.0000 mg | ORAL_TABLET | Freq: Two times a day (BID) | ORAL | Status: DC

## 2013-03-20 NOTE — MAU Provider Note (Signed)
History     CSN: [id number]  Arrival date and time: 03/20/13 1615   None     Chief Complaint  Patient presents with  . Abdominal Pain  . Vaginal Discharge   HPI 27 y.o. [id number] at [redacted]w[redacted]d here with vaginal discharge x 1 week, some itching, no bleeding today. Some pain with urination. Triaged in MAU yesterday, had labs and an u/s showing a viable IUP. Left prior to being seen by provider. UA showed UTI at that time, Keflex sent to pharmacy, pt informed today.   Past Medical History  Diagnosis Date  . Migraines   . History of recurrent UTI (urinary tract infection)   . Normal pregnancy, repeat 03/09/2012  . SVD (spontaneous vaginal delivery) 03/10/2012    Past Surgical History  Procedure Laterality Date  . No past surgeries      Family History  Problem Relation Age of Onset  . Anesthesia problems Neg Hx   . Cancer Mother     breast  . Diabetes Maternal Grandmother   . Diabetes Paternal Grandmother     History  Substance Use Topics  . Smoking status: Never Smoker   . Smokeless tobacco: Never Used  . Alcohol Use: No    Allergies: No Known Allergies  Prescriptions prior to admission  Medication Sig Dispense Refill  . cephALEXin (KEFLEX) 500 MG capsule Take 1 capsule (500 mg total) by mouth 3 (three) times daily.  21 capsule  0    Review of Systems  Constitutional: Negative.   Respiratory: Negative.   Cardiovascular: Negative.   Gastrointestinal: Negative for nausea, vomiting, abdominal pain, diarrhea and constipation.  Genitourinary: Positive for dysuria. Negative for urgency, frequency, hematuria and flank pain.       Negative for vaginal bleeding,+ discharge   Musculoskeletal: Negative.   Neurological: Negative.   Psychiatric/Behavioral: Negative.    Physical Exam   Blood pressure 110/66, pulse 63, temperature 98.9 F (37.2 C), temperature source Oral, resp. rate 16, last menstrual period 10/30/2012, SpO2 100.00%.  Physical Exam  Nursing note and vitals  reviewed. Constitutional: She is oriented to person, place, and time. She appears well-developed and well-nourished. No distress.  Cardiovascular: Normal rate.   Respiratory: Effort normal.  GI: Soft. There is no tenderness.  Genitourinary: There is no rash, tenderness or lesion on the right labia. There is no rash, tenderness or lesion on the left labia. Uterus is not enlarged and not tender. Cervix exhibits no motion tenderness, no discharge and no friability. Right adnexum displays no mass, no tenderness and no fullness. Left adnexum displays no mass, no tenderness and no fullness. No erythema or bleeding around the vagina. Vaginal discharge (white, malodorous, bubbly) found.  Musculoskeletal: Normal range of motion.  Neurological: She is alert and oriented to person, place, and time.  Skin: Skin is warm and dry.  Psychiatric: She has a normal mood and affect.    MAU Course  Procedures  Results for orders placed during the hospital encounter of 03/20/13 (from the past 24 hour(s))  WET PREP, GENITAL     Status: Abnormal   Collection Time    03/20/13  6:20 PM      Result Value Range   Yeast Wet Prep HPF POC NONE SEEN  NONE SEEN   Trich, Wet Prep NONE SEEN  NONE SEEN   Clue Cells Wet Prep HPF POC MODERATE (*) NONE SEEN   WBC, Wet Prep HPF POC MODERATE (*) NONE SEEN     Assessment and Plan  A:  UTI in pregnancy Bacterial vaginosis  P: Rx for Flagyl sent to patient's pharmacy Rx for Keflex sent previously. Patient encouraged to fill Rx.  Patient left prior to be given instructions and is unaware of the BV diagnosis at this time  Freddi Starr, PA-C  03/20/2013, 8:27 PM

## 2013-03-20 NOTE — MAU Provider Note (Signed)
Attestation of Attending Supervision of Advanced Practitioner (PA/CNM/NP): Evaluation and management procedures were performed by the Advanced Practitioner under my supervision and collaboration.  I have reviewed the Advanced Practitioner's note and chart, and I agree with the management and plan.  Suresh Audi, MD, FACOG Attending Obstetrician & Gynecologist Faculty Practice, [facility]  

## 2013-03-20 NOTE — MAU Note (Signed)
Patient states she has been having cramping mostly with urination for about 1 1/2 weeks. Patient states she has been having a vaginal discharge with an odor for about one week. Denies bleeding today but did have spotting yesterday. Has vomiting once a day. Was triaged yesterday in MAU but left before being seen.

## 2013-03-21 LAB — GC/CHLAMYDIA PROBE AMP
CT Probe RNA: NEGATIVE
GC Probe RNA: NEGATIVE

## 2013-03-26 NOTE — MAU Provider Note (Signed)
Attestation of Attending Supervision of Advanced Practitioner: Evaluation and management procedures were performed by the PA/NP/CNM/OB Fellow under my supervision/collaboration. Chart reviewed and agree with management and plan.  Alexiss Iturralde V 03/26/2013 12:41 PM

## 2013-04-01 NOTE — MAU Note (Signed)
Chart audited for QI 

## 2013-05-19 ENCOUNTER — Inpatient Hospital Stay (HOSPITAL_COMMUNITY)
Admission: AD | Admit: 2013-05-19 | Discharge: 2013-05-19 | Disposition: A | Discharge status: Home / Self Care | Payer: Medicaid Other | Source: Ambulatory Visit | Attending: Obstetrics & Gynecology | Admitting: Obstetrics & Gynecology

## 2013-05-19 ENCOUNTER — Encounter (HOSPITAL_COMMUNITY): Payer: Self-pay | Admitting: *Deleted

## 2013-05-19 DIAGNOSIS — N390 Urinary tract infection, site not specified: Secondary | ICD-10-CM | POA: Insufficient documentation

## 2013-05-19 DIAGNOSIS — E860 Dehydration: Secondary | ICD-10-CM | POA: Insufficient documentation

## 2013-05-19 DIAGNOSIS — R109 Unspecified abdominal pain: Secondary | ICD-10-CM | POA: Insufficient documentation

## 2013-05-19 DIAGNOSIS — O2342 Unspecified infection of urinary tract in pregnancy, second trimester: Secondary | ICD-10-CM

## 2013-05-19 DIAGNOSIS — O2390 Unspecified genitourinary tract infection in pregnancy, unspecified trimester: Secondary | ICD-10-CM | POA: Insufficient documentation

## 2013-05-19 LAB — URINALYSIS, ROUTINE W REFLEX MICROSCOPIC
Glucose, UA: NEGATIVE mg/dL
Leukocytes, UA: NEGATIVE
pH: 6 (ref 5.0–8.0)

## 2013-05-19 LAB — URINE MICROSCOPIC-ADD ON

## 2013-05-19 LAB — WET PREP, GENITAL
Clue Cells Wet Prep HPF POC: NONE SEEN
Trich, Wet Prep: NONE SEEN

## 2013-05-19 MED ORDER — NITROFURANTOIN MONOHYD MACRO 100 MG PO CAPS: 100.0000 mg | ORAL_CAPSULE | Freq: Two times a day (BID) | ORAL | Status: DC

## 2013-05-19 NOTE — MAU Note (Signed)
Different smelling urine, cramping all over belly and back, feeling faint and dizzy, sob, ? Anxiety attack.

## 2013-05-19 NOTE — MAU Provider Note (Signed)
History     CSN: [id number]  Arrival date and time: 05/19/13 1905   None     Chief Complaint  Patient presents with  . Abdominal Cramping   HPI 27 yo [id number] at [redacted]w[redacted]d presents with 1 week of abdominal cramping and intermittent spotting.  States cramping is in lower abdomen, no vaginal discharge other than what is normal for pregnancy and has been feeling faint.  Denies severe headaches, dysuria, chills, fevers, nausea, vomiting.  Reports she had one other UTI in this pregnancy.    Past Medical History  Diagnosis Date  . Migraines   . History of recurrent UTI (urinary tract infection)   . Normal pregnancy, repeat 03/09/2012  . SVD (spontaneous vaginal delivery) 03/10/2012    Past Surgical History  Procedure Laterality Date  . No past surgeries      Family History  Problem Relation Age of Onset  . Anesthesia problems Neg Hx   . Cancer Mother     breast  . Diabetes Maternal Grandmother   . Diabetes Paternal Grandmother     History  Substance Use Topics  . Smoking status: Never Smoker   . Smokeless tobacco: Never Used  . Alcohol Use: No    Allergies: No Known Allergies  No prescriptions prior to admission    Review of Systems  Constitutional: Negative for fever and chills.  Gastrointestinal: Positive for abdominal pain. Negative for nausea and vomiting.  Genitourinary: Negative for dysuria, urgency and frequency.  Neurological: Negative for dizziness and headaches.   Physical Exam   Blood pressure 112/62, pulse 99, temperature 99.1 F (37.3 C), temperature source Oral, resp. rate 18, height 5\' 7"  (1.702 m), last menstrual period 10/30/2012.  Physical Exam  Nursing note and vitals reviewed. Constitutional: She is oriented to person, place, and time. She appears well-developed and well-nourished. No distress.  Cardiovascular: Normal rate, regular rhythm and normal heart sounds.   Respiratory: Effort normal and breath sounds normal. No respiratory distress.  She has no wheezes. She has no rales. She exhibits no tenderness.  GI: Soft. Bowel sounds are normal. There is no tenderness. There is no rebound and no guarding.  Genitourinary: Vagina normal and uterus normal.  Heavy white discharge   Neurological: She is alert and oriented to person, place, and time.  Skin: Skin is warm and dry. She is not diaphoretic.  Cervix:  Closed, thick, long     Results for orders placed during the hospital encounter of 05/19/13 (from the past 24 hour(s))  URINALYSIS, ROUTINE W REFLEX MICROSCOPIC     Status: Abnormal   Collection Time    05/19/13  7:30 PM      Result Value Range   Color, Urine YELLOW  YELLOW   APPearance CLOUDY (*) CLEAR   Specific Gravity, Urine >1.030 (*) 1.005 - 1.030   pH 6.0  5.0 - 8.0   Glucose, UA NEGATIVE  NEGATIVE mg/dL   Hgb urine dipstick NEGATIVE  NEGATIVE   Bilirubin Urine NEGATIVE  NEGATIVE   Ketones, ur 15 (*) NEGATIVE mg/dL   Protein, ur NEGATIVE  NEGATIVE mg/dL   Urobilinogen, UA 0.2  0.0 - 1.0 mg/dL   Nitrite POSITIVE (*) NEGATIVE   Leukocytes, UA NEGATIVE  NEGATIVE  URINE MICROSCOPIC-ADD ON     Status: Abnormal   Collection Time    05/19/13  7:30 PM      Result Value Range   Squamous Epithelial / LPF RARE  RARE   WBC, UA 0-2  <3 WBC/hpf  RBC / HPF 0-2  <3 RBC/hpf   Bacteria, UA MANY (*) RARE   Urine-Other MUCOUS PRESENT       Fetal heart rate:  156 bpm    MAU Course  Procedures  MDM UA GC and Wet prep pending  Urine Culture pending   Assessment and Plan  A: UTI Dehydration in pregnancy  Cultures pending   P: Macrobid 100 mg PO BID x7days Discussed important of good hydration Return if symptoms worsen or new symptoms arise.    Chrissie Noa 05/19/2013, 9:41 PM   I saw and examined patient and agree with above student note. I reviewed history, imaging, labs, and vitals. FHR is 156. Napoleon Form, MD

## 2013-05-20 LAB — GC/CHLAMYDIA PROBE AMP
CT Probe RNA: NEGATIVE
GC Probe RNA: NEGATIVE

## 2013-05-21 LAB — URINE CULTURE

## 2013-06-06 ENCOUNTER — Encounter (HOSPITAL_COMMUNITY): Payer: Self-pay | Admitting: *Deleted

## 2013-06-06 ENCOUNTER — Inpatient Hospital Stay (HOSPITAL_COMMUNITY)
Admission: AD | Admit: 2013-06-06 | Discharge: 2013-06-06 | Disposition: A | Discharge status: Home / Self Care | Payer: Medicaid Other | Source: Ambulatory Visit | Attending: Obstetrics and Gynecology | Admitting: Obstetrics and Gynecology

## 2013-06-06 DIAGNOSIS — Y9229 Other specified public building as the place of occurrence of the external cause: Secondary | ICD-10-CM | POA: Insufficient documentation

## 2013-06-06 DIAGNOSIS — R109 Unspecified abdominal pain: Secondary | ICD-10-CM | POA: Insufficient documentation

## 2013-06-06 DIAGNOSIS — O0930 Supervision of pregnancy with insufficient antenatal care, unspecified trimester: Secondary | ICD-10-CM

## 2013-06-06 DIAGNOSIS — O99891 Other specified diseases and conditions complicating pregnancy: Secondary | ICD-10-CM | POA: Insufficient documentation

## 2013-06-06 DIAGNOSIS — W208XXA Other cause of strike by thrown, projected or falling object, initial encounter: Secondary | ICD-10-CM | POA: Insufficient documentation

## 2013-06-06 DIAGNOSIS — O9A212 Injury, poisoning and certain other consequences of external causes complicating pregnancy, second trimester: Secondary | ICD-10-CM

## 2013-06-06 NOTE — MAU Provider Note (Signed)
  History     CSN: [id number]  Arrival date and time: 06/06/13 1528   None     Chief Complaint  Patient presents with  . Totes fell on abdomen    HPI  Pt is a [id number] here at 18.3 wks IUP here with report of totes falling on abdomen while working at approximately 1445.  Totes fell from a shelf and landed on left upper abdomen.  No report of vaginal bleeding.  + intermittent abdominal pain since the incident.  +fetal movement felt today.  No prenatal care due to lack of insurance.    Past Medical History  Diagnosis Date  . Migraines   . History of recurrent UTI (urinary tract infection)   . Normal pregnancy, repeat 03/09/2012  . SVD (spontaneous vaginal delivery) 03/10/2012    Past Surgical History  Procedure Laterality Date  . No past surgeries      Family History  Problem Relation Age of Onset  . Anesthesia problems Neg Hx   . Cancer Mother     breast  . Diabetes Maternal Grandmother   . Diabetes Paternal Grandmother     History  Substance Use Topics  . Smoking status: Never Smoker   . Smokeless tobacco: Never Used  . Alcohol Use: No    Allergies: No Known Allergies  Prescriptions prior to admission  Medication Sig Dispense Refill  . acetaminophen (TYLENOL) 500 MG tablet Take 500 mg by mouth every 6 (six) hours as needed for pain (For headache.).        Review of Systems  Gastrointestinal: Positive for abdominal pain (right upper abdomen tenderness.  ).  All other systems reviewed and are negative.   Physical Exam   Blood pressure 114/71, pulse 81, temperature 98.7 F (37.1 C), temperature source Oral, resp. rate 16, height 5\' 7"  (1.702 m), weight 69.003 kg (152 lb 2 oz), last menstrual period 10/30/2012, not currently breastfeeding.  Physical Exam  Constitutional: She is oriented to person, place, and time. She appears well-developed and well-nourished.  HENT:  Head: Normocephalic.  Neck: Normal range of motion. Neck supple.  Cardiovascular: Normal  rate, regular rhythm and normal heart sounds.   Respiratory: Effort normal and breath sounds normal.  GI: Soft. There is tenderness (LUQ).  Fundal height - 19  Genitourinary: No bleeding around the vagina.  Neurological: She is alert and oriented to person, place, and time.  Skin: Skin is warm and dry.  FHR 152  MAU Course  Procedures No results found for this or any previous visit (from the past 24 hour(s)).   Assessment and Plan  Abdominal Trauma in Pregnancy  Plan: Discharge to home Schedule outpatient ultrasound for anatomy Refer to outpatient clinic for prenatal care > referral message sent  [facility] 06/06/2013, 7:03 PM

## 2013-06-06 NOTE — MAU Note (Signed)
Pt states was pulling empty totes down from shelf and totes fell, hitting pt's left side only, hit lower portion of pt's left abdomen. Pain is located there only. Denies bleeding or abnormal vaginal discharge. Pain is constant, only with walking, unable to bend at the hips.

## 2013-06-06 NOTE — MAU Note (Signed)
Pt stated having pain in her left side when only when she walks.

## 2013-06-09 NOTE — MAU Provider Note (Signed)
Attestation of Attending Supervision of Advanced Practitioner (CNM/NP): Evaluation and management procedures were performed by the Advanced Practitioner under my supervision and collaboration.  I have reviewed the Advanced Practitioner's note and chart, and I agree with the management and plan.  Koni Kannan 06/09/2013 10:04 AM

## 2013-06-12 ENCOUNTER — Ambulatory Visit (HOSPITAL_COMMUNITY): Admission: RE | Admit: 2013-06-12 | Payer: Medicaid Other | Source: Ambulatory Visit

## 2013-06-13 ENCOUNTER — Ambulatory Visit (HOSPITAL_COMMUNITY)
Admission: RE | Admit: 2013-06-13 | Discharge: 2013-06-13 | Disposition: A | Discharge status: Home / Self Care | Payer: Medicaid Other | Source: Ambulatory Visit | Attending: Family | Admitting: Family

## 2013-06-13 DIAGNOSIS — Z363 Encounter for antenatal screening for malformations: Secondary | ICD-10-CM | POA: Insufficient documentation

## 2013-06-13 DIAGNOSIS — O0930 Supervision of pregnancy with insufficient antenatal care, unspecified trimester: Secondary | ICD-10-CM | POA: Insufficient documentation

## 2013-06-13 DIAGNOSIS — O358XX0 Maternal care for other (suspected) fetal abnormality and damage, not applicable or unspecified: Secondary | ICD-10-CM | POA: Insufficient documentation

## 2013-06-13 DIAGNOSIS — Z1389 Encounter for screening for other disorder: Secondary | ICD-10-CM | POA: Insufficient documentation

## 2013-06-13 DIAGNOSIS — O9A212 Injury, poisoning and certain other consequences of external causes complicating pregnancy, second trimester: Secondary | ICD-10-CM

## 2013-07-07 ENCOUNTER — Ambulatory Visit (INDEPENDENT_AMBULATORY_CARE_PROVIDER_SITE_OTHER): Payer: Medicaid Other | Admitting: Obstetrics and Gynecology

## 2013-07-07 ENCOUNTER — Encounter: Payer: Self-pay | Admitting: Obstetrics and Gynecology

## 2013-07-07 ENCOUNTER — Other Ambulatory Visit (HOSPITAL_COMMUNITY)
Admission: RE | Admit: 2013-07-07 | Discharge: 2013-07-07 | Disposition: A | Discharge status: Home / Self Care | Payer: Medicaid Other | Source: Ambulatory Visit | Attending: Obstetrics and Gynecology | Admitting: Obstetrics and Gynecology

## 2013-07-07 VITALS — BP 117/74 | Temp 97.9°F | Wt 150.1 lb

## 2013-07-07 DIAGNOSIS — Z3492 Encounter for supervision of normal pregnancy, unspecified, second trimester: Secondary | ICD-10-CM | POA: Insufficient documentation

## 2013-07-07 DIAGNOSIS — O0930 Supervision of pregnancy with insufficient antenatal care, unspecified trimester: Secondary | ICD-10-CM

## 2013-07-07 DIAGNOSIS — Z803 Family history of malignant neoplasm of breast: Secondary | ICD-10-CM | POA: Insufficient documentation

## 2013-07-07 DIAGNOSIS — Z01419 Encounter for gynecological examination (general) (routine) without abnormal findings: Secondary | ICD-10-CM | POA: Insufficient documentation

## 2013-07-07 DIAGNOSIS — O0932 Supervision of pregnancy with insufficient antenatal care, second trimester: Secondary | ICD-10-CM | POA: Insufficient documentation

## 2013-07-07 DIAGNOSIS — Z113 Encounter for screening for infections with a predominantly sexual mode of transmission: Secondary | ICD-10-CM | POA: Insufficient documentation

## 2013-07-07 DIAGNOSIS — O209 Hemorrhage in early pregnancy, unspecified: Secondary | ICD-10-CM | POA: Insufficient documentation

## 2013-07-07 HISTORY — DX: Family history of malignant neoplasm of breast: Z80.3

## 2013-07-07 LAB — POCT URINALYSIS DIP (DEVICE)
Hgb urine dipstick: NEGATIVE
Protein, ur: 30 mg/dL — AB
Specific Gravity, Urine: >=1.03 (ref 1.005–1.030)
Urobilinogen, UA: 2 mg/dL — ABNORMAL HIGH (ref 0.0–1.0)

## 2013-07-07 MED ORDER — PRENATAL PLUS 27-1 MG PO TABS: 1.0000 | ORAL_TABLET | Freq: Every day | ORAL | Status: DC

## 2013-07-07 NOTE — Addendum Note (Signed)
Addended by: Faythe Casa on: 07/07/2013 04:08 PM   Modules accepted: Orders

## 2013-07-07 NOTE — Progress Notes (Signed)
Subjective:    Margaret Knight is a [id number] [redacted]w[redacted]d being seen today for her first obstetrical visit.  Her obstetrical history is significant for insufficient prenatal care, early pregnancy bleeding through 16 weeks. Patient does intend to breast feed. Pregnancy history fully reviewed. FH mother breast Ca age 27.   Patient reports no complaints.  Filed Vitals:   07/07/13 1429  BP: 117/74  Temp: 97.9 F (36.6 C)  Weight: 150 lb 1.6 oz (68.085 kg)    HISTORY: OB History  Gravida Para Term Preterm AB SAB TAB Ectopic Multiple Living  7 3 3  0 3 1 2  0 0 3    # Outcome Date GA Lbr Len/2nd Weight Sex Delivery Anes PTL Lv  7 CUR           6 TRM 03/10/12 [redacted]w[redacted]d 41:01 / 00:21 7 lb 13.4 oz (3.555 kg) F SVD EPI  Y  5 TRM 2008 [redacted]w[redacted]d  6 lb (2.722 kg) F SVD   Y  4 TAB 2007          3 TRM 07/2005 [redacted]w[redacted]d  7 lb 14 oz (3.572 kg) M SVD   Y  2 TAB 2006          1 SAB              Past Medical History  Diagnosis Date  . Migraines   . History of recurrent UTI (urinary tract infection)   . Normal pregnancy, repeat 03/09/2012  . SVD (spontaneous vaginal delivery) 03/10/2012   Past Surgical History  Procedure Laterality Date  . No past surgeries     Family History  Problem Relation Age of Onset  . Anesthesia problems Neg Hx   . Cancer Mother     breast  . Hypertension Mother   . Diabetes Maternal Grandmother   . Diabetes Paternal Grandmother      Exam    Uterus:   S=D  Pelvic Exam:    Perineum: No Hemorrhoids, Normal Perineum   Vulva: normal, Bartholin's, Urethra, Skene's normal   Vagina:  normal mucosa, normal discharge       Cervix: L/C/H no bleeding with Pap   Adnexa: not evaluated   Bony Pelvis: average  System: Breast:  normal appearance, no masses or tenderness, Inspection negative, No nipple retraction or dimpling, No nipple discharge or bleeding, No axillary or supraclavicular adenopathy, Normal to palpation without dominant masses, Taught monthly breast self examination   Skin: normal coloration and turgor, no rashes    Neurologic: oriented, normal, grossly non-focal   Extremities: normal strength, tone, and muscle mass, no deformities, gait was normal for age   HEENT PERRLA   Mouth/Teeth mucous membranes moist, pharynx normal without lesions and dental hygiene good   Neck supple and thyroid ULNS, smooth   Cardiovascular: regular rate and rhythm, no murmurs or gallops   Respiratory:  appears well, vitals normal, no respiratory distress, acyanotic, normal RR, ear and throat exam is normal, neck free of mass or lymphadenopathy, chest clear, no wheezing, crepitations, rhonchi, normal symmetric air entry   Abdomen: DT 160, NT   Urinary: urethral meatus normal      Assessment:    Pregnancy: [id number] Patient Active Problem List   Diagnosis Date Noted  . Supervision of normal pregnancy in second trimester 07/07/2013  . Bleeding in early pregnancy 07/07/2013        Plan:     Initial labs drawn. Prenatal vitamins. Problem list reviewed and updated. Genetic Screening discussed [employer]  Screen: too late.  Ultrasound discussed; fetal survey: results reviewed.  Follow up in 4 weeks. 50% of 30 min visit spent on counseling and coordination of care.  TSH drawn for ULNS/sl enlarged thyroid Discussed early baseline mammogram and BRCA  testinginfo   Prisca Gearing 07/07/2013

## 2013-07-07 NOTE — Addendum Note (Signed)
Addended by: Franchot Mimes on: 07/07/2013 03:15 PM   Modules accepted: Orders

## 2013-07-07 NOTE — Patient Instructions (Addendum)
Pregnancy - Second Trimester The second trimester of pregnancy (3 to 6 months) is a period of rapid growth for you and your baby. At the end of the sixth month, your baby is about 9 inches long and weighs 1 1/2 pounds. You will begin to feel the baby move between 18 and 20 weeks of the pregnancy. This is called quickening. Weight gain is faster. A clear fluid (colostrum) may leak out of your breasts. You may feel small contractions of the womb (uterus). This is known as false labor or Braxton-Hicks contractions. This is like a practice for labor when the baby is ready to be born. Usually, the problems with morning sickness have usually passed by the end of your first trimester. Some women develop small dark blotches (called cholasma, mask of pregnancy) on their face that usually goes away after the baby is born. Exposure to the sun makes the blotches worse. Acne may also develop in some pregnant women and pregnant women who have acne, may find that it goes away. PRENATAL EXAMS  Blood work may continue to be done during prenatal exams. These tests are done to check on your health and the probable health of your baby. Blood work is used to follow your blood levels (hemoglobin). Anemia (low hemoglobin) is common during pregnancy. Iron and vitamins are given to help prevent this. You will also be checked for diabetes between 24 and 28 weeks of the pregnancy. Some of the previous blood tests may be repeated.  The size of the uterus is measured during each visit. This is to make sure that the baby is continuing to grow properly according to the dates of the pregnancy.  Your blood pressure is checked every prenatal visit. This is to make sure you are not getting toxemia.  Your urine is checked to make sure you do not have an infection, diabetes or protein in the urine.  Your weight is checked often to make sure gains are happening at the suggested rate. This is to ensure that both you and your baby are growing  normally.  Sometimes, an ultrasound is performed to confirm the proper growth and development of the baby. This is a test which bounces harmless sound waves off the baby so your caregiver can more accurately determine due dates. Sometimes, a test is done on the amniotic fluid surrounding the baby. This test is called an amniocentesis. The amniotic fluid is obtained by sticking a needle into the belly (abdomen). This is done to check the chromosomes in instances where there is a concern about possible genetic problems with the baby. It is also sometimes done near the end of pregnancy if an early delivery is required. In this case, it is done to help make sure the baby's lungs are mature enough for the baby to live outside of the womb. CHANGES OCCURING IN THE SECOND TRIMESTER OF PREGNANCY Your body goes through many changes during pregnancy. They vary from person to person. Talk to your caregiver about changes you notice that you are concerned about.  During the second trimester, you will likely have an increase in your appetite. It is normal to have cravings for certain foods. This varies from person to person and pregnancy to pregnancy.  Your lower abdomen will begin to bulge.  You may have to urinate more often because the uterus and baby are pressing on your bladder. It is also common to get more bladder infections during pregnancy. You can help this by drinking lots of fluids  and emptying your bladder before and after intercourse.  You may begin to get stretch marks on your hips, abdomen, and breasts. These are normal changes in the body during pregnancy. There are no exercises or medicines to take that prevent this change.  You may begin to develop swollen and bulging veins (varicose veins) in your legs. Wearing support hose, elevating your feet for 15 minutes, 3 to 4 times a day and limiting salt in your diet helps lessen the problem.  Heartburn may develop as the uterus grows and pushes up  against the stomach. Antacids recommended by your caregiver helps with this problem. Also, eating smaller meals 4 to 5 times a day helps.  Constipation can be treated with a stool softener or adding bulk to your diet. Drinking lots of fluids, and eating vegetables, fruits, and whole grains are helpful.  Exercising is also helpful. If you have been very active up until your pregnancy, most of these activities can be continued during your pregnancy. If you have been less active, it is helpful to start an exercise program such as walking.  Hemorrhoids may develop at the end of the second trimester. Warm sitz baths and hemorrhoid cream recommended by your caregiver helps hemorrhoid problems.  Backaches may develop during this time of your pregnancy. Avoid heavy lifting, wear low heal shoes, and practice good posture to help with backache problems.  Some pregnant women develop tingling and numbness of their hand and fingers because of swelling and tightening of ligaments in the wrist (carpel tunnel syndrome). This goes away after the baby is born.  As your breasts enlarge, you may have to get a bigger bra. Get a comfortable, cotton, support bra. Do not get a nursing bra until the last month of the pregnancy if you will be nursing the baby.  You may get a dark line from your belly button to the pubic area called the linea nigra.  You may develop rosy cheeks because of increase blood flow to the face.  You may develop spider looking lines of the face, neck, arms, and chest. These go away after the baby is born. HOME CARE INSTRUCTIONS   It is extremely important to avoid all smoking, herbs, alcohol, and unprescribed drugs during your pregnancy. These chemicals affect the formation and growth of the baby. Avoid these chemicals throughout the pregnancy to ensure the delivery of a healthy infant.  Most of your home care instructions are the same as suggested for the first trimester of your pregnancy.  Keep your caregiver's appointments. Follow your caregiver's instructions regarding medicine use, exercise, and diet.  During pregnancy, you are providing food for you and your baby. Continue to eat regular, well-balanced meals. Choose foods such as meat, fish, milk and other low fat dairy products, vegetables, fruits, and whole-grain breads and cereals. Your caregiver will tell you of the ideal weight gain.  A physical sexual relationship may be continued up until near the end of pregnancy if there are no other problems. Problems could include early (premature) leaking of amniotic fluid from the membranes, vaginal bleeding, abdominal pain, or other medical or pregnancy problems.  Exercise regularly if there are no restrictions. Check with your caregiver if you are unsure of the safety of some of your exercises. The greatest weight gain will occur in the last 2 trimesters of pregnancy. Exercise will help you:  Control your weight.  Get you in shape for labor and delivery.  Lose weight after you have the baby.  Wear  a good support or jogging bra for breast tenderness during pregnancy. This may help if worn during sleep. Pads or tissues may be used in the bra if you are leaking colostrum.  Do not use hot tubs, steam rooms or saunas throughout the pregnancy.  Wear your seat belt at all times when driving. This protects you and your baby if you are in an accident.  Avoid raw meat, uncooked cheese, cat litter boxes, and soil used by cats. These carry germs that can cause birth defects in the baby.  The second trimester is also a good time to visit your dentist for your dental health if this has not been done yet. Getting your teeth cleaned is okay. Use a soft toothbrush. Brush gently during pregnancy.  It is easier to leak urine during pregnancy. Tightening up and strengthening the pelvic muscles will help with this problem. Practice stopping your urination while you are going to the bathroom.  These are the same muscles you need to strengthen. It is also the muscles you would use as if you were trying to stop from passing gas. You can practice tightening these muscles up 10 times a set and repeating this about 3 times per day. Once you know what muscles to tighten up, do not perform these exercises during urination. It is more likely to contribute to an infection by backing up the urine.  Ask for help if you have financial, counseling, or nutritional needs during pregnancy. Your caregiver will be able to offer counseling for these needs as well as refer you for other special needs.  Your skin may become oily. If so, wash your face with mild soap, use non-greasy moisturizer and oil or cream based makeup. MEDICINES AND DRUG USE IN PREGNANCY  Take prenatal vitamins as directed. The vitamin should contain 1 milligram of folic acid. Keep all vitamins out of reach of children. Only a couple vitamins or tablets containing iron may be fatal to a baby or young child when ingested.  Avoid use of all medicines, including herbs, over-the-counter medicines, not prescribed or suggested by your caregiver. Only take over-the-counter or prescription medicines for pain, discomfort, or fever as directed by your caregiver. Do not use aspirin.  Let your caregiver also know about herbs you may be using.  Alcohol is related to a number of birth defects. This includes fetal alcohol syndrome. All alcohol, in any form, should be avoided completely. Smoking will cause low birth rate and premature babies.  Street or illegal drugs are very harmful to the baby. They are absolutely forbidden. A baby born to an addicted mother will be addicted at birth. The baby will go through the same withdrawal an adult does. SEEK MEDICAL CARE IF:  You have any concerns or worries during your pregnancy. It is better to call with your questions if you feel they cannot wait, rather than worry about them. SEEK IMMEDIATE MEDICAL CARE  IF:   An unexplained oral temperature above 102 F (38.9 C) develops, or as your caregiver suggests.  You have leaking of fluid from the vagina (birth canal). If leaking membranes are suspected, take your temperature and tell your caregiver of this when you call.  There is vaginal spotting, bleeding, or passing clots. Tell your caregiver of the amount and how many pads are used. Light spotting in pregnancy is common, especially following intercourse.  You develop a bad smelling vaginal discharge with a change in the color from clear to white.  You continue to feel  sick to your stomach (nauseated) and have no relief from remedies suggested. You vomit blood or coffee ground-like materials.  You lose more than 2 pounds of weight or gain more than 2 pounds of weight over 1 week, or as suggested by your caregiver.  You notice swelling of your face, hands, feet, or legs.  You get exposed to [place] measles and have never had them.  You are exposed to fifth disease or chickenpox.  You develop belly (abdominal) pain. Round ligament discomfort is a common non-cancerous (benign) cause of abdominal pain in pregnancy. Your caregiver still must evaluate you.  You develop a bad headache that does not go away.  You develop fever, diarrhea, pain with urination, or shortness of breath.  You develop visual problems, blurry, or double vision.  You fall or are in a car accident or any kind of trauma.  There is mental or physical violence at home. Document Released: 10/10/2001 Document Revised: 07/10/2012 Document Reviewed: 04/14/2009 [facility] Patient Information 2014 [place], [place]. BRCA-1 and BRCA-2 BRCA-1 and BRCA-2 are 2 genes that are linked with hereditary breast and ovarian cancers. About 200,000 women are diagnosed with invasive breast cancer each year and about 23,000 with ovarian cancer (according to the American Cancer Society). Of these cancers, about 5% to 10% will be due to a mutation in  one of the BRCA genes. Men can also inherit an increased risk of developing breast cancer, primarily from an alteration in the BRCA-2 gene.  Individuals with mutations in BRCA1 or BRCA2 have significantly elevated risks for breast cancer (up to 80% lifetime risk), ovarian cancer (up to 40% lifetime risk), bilateral breast cancer and other types of cancers. BRCA mutations are inherited and passed from generation to generation. One half of the time, they are passed from the father's side of the family.  The DNA in white blood cells is used to detect mutations in the BRCA genes. While the gene products (proteins) of the BRCA genes act only in breast and ovarian tissue, the genes are present in every cell of the body and blood is the most easily accessible source of that DNA. PREPARATION FOR TEST The test for BRCA mutations is done on a blood sample collected by needle from a vein in the arm. The test does not require surgical biopsy of breast or ovarian tissue.  NORMAL FINDINGS No genetic mutations. Ranges for normal findings may vary among different laboratories and hospitals. You should always check with your doctor after having lab work or other tests done to discuss the meaning of your test results and whether your values are considered within normal limits. MEANING OF TEST  Your caregiver will go over the test results with you and discuss the importance and meaning of your results, as well as treatment options and the need for additional tests if necessary. OBTAINING THE TEST RESULTS It is your responsibility to obtain your test results. Ask the lab or department performing the test when and how you will get your results. OTHER THINGS TO KNOW Your test results may have implications for other family members. When one member of a family is tested for BRCA mutations, issues often arise about how or whether to share this information with other family members. Seek advice from a genetic counselor about  communication of result with your family members.  Pre and post test consultation with a health care provider knowledgeable about genetic testing cannot be overemphasized.  There are many issues to be considered when preparing for a  genetic test and upon learning the results, and a genetic counselor has the knowledge and experience to help you sort through them.  If the BRCA test is positive, the options include increased frequency of check-ups (e.g., mammography, blood tests for CA-125, or transvaginal ultrasonography); medications that could reduce risk (e.g., oral contraceptives or tamoxifen); or surgical removal of the ovaries or breasts. There are a number of variables involved and it is important to discuss your options with your doctor and genetic counselor. Research studies have reported that for every 1000 women negative for BRCA mutations, between 12 and 45 of them will develop breast cancer by age 53 and between 3 and 4 will develop ovarian cancer by age 5. The risk increases with age. The test can be ordered by a doctor, preferably by one who can also offer genetic counseling. The blood sample will be sent to a laboratory that specializes in BRCA testing. The American Society of Clinical Oncology and the National Breast Cancer Coalition encourage women seeking the test to participate in long-term outcome studies to help gather information on the effectiveness of different check-up and treatment options. Document Released: 11/09/2004 Document Revised: 01/08/2012 Document Reviewed: 09/21/2008 [facility] Patient Information 2014 [place], [place].

## 2013-07-07 NOTE — Progress Notes (Signed)
Pulse: 102

## 2013-08-06 ENCOUNTER — Encounter: Payer: Medicaid Other | Admitting: Family Medicine

## 2013-09-04 ENCOUNTER — Inpatient Hospital Stay (HOSPITAL_COMMUNITY)
Admission: AD | Admit: 2013-09-04 | Discharge: 2013-09-04 | Disposition: A | Discharge status: Home / Self Care | Payer: Medicaid Other | Source: Ambulatory Visit | Attending: Family Medicine | Admitting: Family Medicine

## 2013-09-04 ENCOUNTER — Encounter (HOSPITAL_COMMUNITY): Payer: Self-pay | Admitting: *Deleted

## 2013-09-04 DIAGNOSIS — O2390 Unspecified genitourinary tract infection in pregnancy, unspecified trimester: Secondary | ICD-10-CM | POA: Insufficient documentation

## 2013-09-04 DIAGNOSIS — Z5900 Homelessness unspecified: Secondary | ICD-10-CM | POA: Insufficient documentation

## 2013-09-04 DIAGNOSIS — B373 Candidiasis of vulva and vagina: Secondary | ICD-10-CM | POA: Insufficient documentation

## 2013-09-04 DIAGNOSIS — B3731 Acute candidiasis of vulva and vagina: Secondary | ICD-10-CM | POA: Insufficient documentation

## 2013-09-04 DIAGNOSIS — Z3492 Encounter for supervision of normal pregnancy, unspecified, second trimester: Secondary | ICD-10-CM

## 2013-09-04 DIAGNOSIS — O26859 Spotting complicating pregnancy, unspecified trimester: Secondary | ICD-10-CM | POA: Insufficient documentation

## 2013-09-04 LAB — WET PREP, GENITAL
Trich, Wet Prep: NONE SEEN
Yeast Wet Prep HPF POC: NONE SEEN

## 2013-09-04 MED ORDER — FLUCONAZOLE 150 MG PO TABS
150.0000 mg | ORAL_TABLET | Freq: Once | ORAL | Status: AC
  Administered 2013-09-04: 150 mg via ORAL
  Filled 2013-09-04: qty 1

## 2013-09-04 MED ORDER — FLUCONAZOLE 150 MG PO TABS: 150.0000 mg | ORAL_TABLET | Freq: Once | ORAL | Status: DC
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
## 2013-09-05 NOTE — MAU Provider Note (Signed)
Attestation of Attending Supervision of Advanced Practitioner (CNM/NP): Evaluation and management procedures were performed by the Advanced Practitioner under my supervision and collaboration.  I have reviewed the Advanced Practitioner's note and chart, and I agree with the management and plan.  Lashan Gluth 09/05/2013 6:29 AM

## 2013-09-17 ENCOUNTER — Ambulatory Visit (INDEPENDENT_AMBULATORY_CARE_PROVIDER_SITE_OTHER): Payer: Medicaid Other | Admitting: Obstetrics and Gynecology

## 2013-09-17 VITALS — BP 115/76 | Temp 97.2°F | Wt 162.9 lb

## 2013-09-17 DIAGNOSIS — O0930 Supervision of pregnancy with insufficient antenatal care, unspecified trimester: Secondary | ICD-10-CM

## 2013-09-17 DIAGNOSIS — Z3492 Encounter for supervision of normal pregnancy, unspecified, second trimester: Secondary | ICD-10-CM

## 2013-09-17 DIAGNOSIS — Z3493 Encounter for supervision of normal pregnancy, unspecified, third trimester: Secondary | ICD-10-CM

## 2013-09-17 LAB — POCT URINALYSIS DIP (DEVICE)
Bilirubin Urine: NEGATIVE
Glucose, UA: NEGATIVE mg/dL
Hgb urine dipstick: NEGATIVE
Leukocytes, UA: NEGATIVE
Nitrite: NEGATIVE
Urobilinogen, UA: 0.2 mg/dL (ref 0.0–1.0)
pH: 7 (ref 5.0–8.0)

## 2013-09-17 LAB — CBC
HCT: 32.4 % — ABNORMAL LOW (ref 36.0–46.0)
MCH: 28.8 pg (ref 26.0–34.0)
MCV: 85.7 fL (ref 78.0–100.0)
Platelets: 243 10*3/uL (ref 150–400)
RDW: 14.3 % (ref 11.5–15.5)
WBC: 6.6 10*3/uL (ref 4.0–10.5)

## 2013-09-17 NOTE — Progress Notes (Signed)
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 . Has 27 yr old. CBC and glucola today. [place] for F/U anatomy.

## 2013-09-17 NOTE — Progress Notes (Signed)
Pulse- 98 Patient reports contractions here and there; states she can feel her heart racing a lot, thinks this is related to frequent anxiety attacks

## 2013-09-17 NOTE — Patient Instructions (Signed)
Near-Syncope °Near-syncope (commonly known as near fainting) is sudden weakness, dizziness, or feeling like you might pass out. During an episode of near-syncope, you may also develop pale skin, have tunnel vision, or feel sick to your stomach (nauseous). Near-syncope may occur when getting up after sitting or while standing for a long time. It is caused by a sudden decrease in blood flow to the brain. This decrease can result from various causes or triggers, most of which are not serious. However, because near-syncope can sometimes be a sign of something serious, a medical evaluation is required. The specific cause is often not determined. °HOME CARE INSTRUCTIONS  °Monitor your condition for any changes. The following actions may help to alleviate any discomfort you are experiencing: °· Have someone stay with you until you feel stable. °· Lie down right away if you start feeling like you might faint. Breathe deeply and steadily. Wait until all the symptoms have passed. Most of these episodes last only a few minutes. You may feel tired for several hours.   °· Drink enough fluids to keep your urine clear or pale yellow.   °· If you are taking blood pressure or heart medicine, get up slowly when seated or lying down. Take several minutes to sit and then stand. This can reduce dizziness. °· Follow up with your health care provider as directed.  °SEEK IMMEDIATE MEDICAL CARE IF:  °· You have a severe headache.   °· You have unusual pain in the chest, abdomen, or back.   °· You are bleeding from the mouth or rectum, or you have black or tarry stool.   °· You have an irregular or very fast heartbeat.   °· You have repeated fainting or have seizure-like jerking during an episode.   °· You faint when sitting or lying down.   °· You have confusion.   °· You have difficulty walking.   °· You have severe weakness.   °· You have vision problems.   °MAKE SURE YOU:  °· Understand these instructions. °· Will watch your  condition. °· Will get help right away if you are not doing well or get worse. °Document Released: 10/16/2005 Document Revised: 06/18/2013 Document Reviewed: 03/21/2013 °[employer]® Patient Information ©2014 [employer]. ° °

## 2013-09-18 ENCOUNTER — Encounter: Payer: Self-pay | Admitting: *Deleted

## 2013-09-18 LAB — RPR

## 2013-10-03 ENCOUNTER — Ambulatory Visit (HOSPITAL_COMMUNITY)
Admission: RE | Admit: 2013-10-03 | Discharge: 2013-10-03 | Disposition: A | Discharge status: Home / Self Care | Payer: Medicaid Other | Source: Ambulatory Visit | Attending: Obstetrics and Gynecology | Admitting: Obstetrics and Gynecology

## 2013-10-03 ENCOUNTER — Ambulatory Visit (INDEPENDENT_AMBULATORY_CARE_PROVIDER_SITE_OTHER): Payer: Medicaid Other | Admitting: Obstetrics and Gynecology

## 2013-10-03 VITALS — BP 118/80 | Temp 97.7°F | Wt 162.0 lb

## 2013-10-03 DIAGNOSIS — Z3492 Encounter for supervision of normal pregnancy, unspecified, second trimester: Secondary | ICD-10-CM

## 2013-10-03 DIAGNOSIS — Z3689 Encounter for other specified antenatal screening: Secondary | ICD-10-CM | POA: Insufficient documentation

## 2013-10-03 DIAGNOSIS — O209 Hemorrhage in early pregnancy, unspecified: Secondary | ICD-10-CM

## 2013-10-03 DIAGNOSIS — Z3493 Encounter for supervision of normal pregnancy, unspecified, third trimester: Secondary | ICD-10-CM

## 2013-10-03 LAB — POCT URINALYSIS DIP (DEVICE)
Glucose, UA: NEGATIVE mg/dL
Hgb urine dipstick: NEGATIVE
Nitrite: NEGATIVE
Specific Gravity, Urine: 1.025 (ref 1.005–1.030)
Urobilinogen, UA: 2 mg/dL — ABNORMAL HIGH (ref 0.0–1.0)
pH: 7 (ref 5.0–8.0)

## 2013-10-03 MED ORDER — FLUCONAZOLE 100 MG PO TABS: 100.0000 mg | ORAL_TABLET | Freq: Every day | ORAL | Status: DC

## 2013-10-03 NOTE — Progress Notes (Signed)
Pulse: 116 

## 2013-10-03 NOTE — Progress Notes (Signed)
Doing well. Discomforts and contraception discussed. Has vaginal itching. Spec: thick white discharge. WP, GC/CT, GBS sent and Rx Difucan.  No presyncope sx.

## 2013-10-03 NOTE — Patient Instructions (Signed)
Levonorgestrel intrauterine device (IUD) What is this medicine? LEVONORGESTREL IUD (LEE voe nor jes trel) is a contraceptive (birth control) device. The device is placed inside the uterus by a healthcare professional. It is used to prevent pregnancy and can also be used to treat heavy bleeding that occurs during your period. Depending on the device, it can be used for 3 to 5 years. This medicine may be used for other purposes; ask your health care provider or pharmacist if you have questions. COMMON BRAND NAME(S): Mirena, Skyla What should I tell my health care provider before I take this medicine? They need to know if you have any of these conditions: -abnormal Pap smear -cancer of the breast, uterus, or cervix -diabetes -endometritis -genital or pelvic infection now or in the past -have more than one sexual partner or your partner has more than one partner -heart disease -history of an ectopic or tubal pregnancy -immune system problems -IUD in place -liver disease or tumor -problems with blood clots or take blood-thinners -use intravenous drugs -uterus of unusual shape -vaginal bleeding that has not been explained -an unusual or allergic reaction to levonorgestrel, other hormones, silicone, or polyethylene, medicines, foods, dyes, or preservatives -pregnant or trying to get pregnant -breast-feeding How should I use this medicine? This device is placed inside the uterus by a health care professional. Talk to your pediatrician regarding the use of this medicine in children. Special care may be needed. Overdosage: If you think you have taken too much of this medicine contact a poison control center or emergency room at once. NOTE: This medicine is only for you. Do not share this medicine with others. What if I miss a dose? This does not apply. What may interact with this medicine? Do not take this medicine with any of the following  medications: -amprenavir -bosentan -fosamprenavir This medicine may also interact with the following medications: -aprepitant -barbiturate medicines for inducing sleep or treating seizures -bexarotene -griseofulvin -medicines to treat seizures like carbamazepine, ethotoin, felbamate, oxcarbazepine, phenytoin, topiramate -modafinil -pioglitazone -rifabutin -rifampin -rifapentine -some medicines to treat HIV infection like atazanavir, indinavir, lopinavir, nelfinavir, tipranavir, ritonavir -St. John's wort -warfarin This list may not describe all possible interactions. Give your health care provider a list of all the medicines, herbs, non-prescription drugs, or dietary supplements you use. Also tell them if you smoke, drink alcohol, or use illegal drugs. Some items may interact with your medicine. What should I watch for while using this medicine? Visit your doctor or health care professional for regular check ups. See your doctor if you or your partner has sexual contact with others, becomes HIV positive, or gets a sexual transmitted disease. This product does not protect you against HIV infection (AIDS) or other sexually transmitted diseases. You can check the placement of the IUD yourself by reaching up to the top of your vagina with clean fingers to feel the threads. Do not pull on the threads. It is a good habit to check placement after each menstrual period. Call your doctor right away if you feel more of the IUD than just the threads or if you cannot feel the threads at all. The IUD may come out by itself. You may become pregnant if the device comes out. If you notice that the IUD has come out use a backup birth control method like condoms and call your health care provider. Using tampons will not change the position of the IUD and are okay to use during your period. What side effects may I   notice from receiving this medicine? Side effects that you should report to your doctor or  health care professional as soon as possible: -allergic reactions like skin rash, itching or hives, swelling of the face, lips, or tongue -fever, flu-like symptoms -genital sores -high blood pressure -no menstrual period for 6 weeks during use -pain, swelling, warmth in the leg -pelvic pain or tenderness -severe or sudden headache -signs of pregnancy -stomach cramping -sudden shortness of breath -trouble with balance, talking, or walking -unusual vaginal bleeding, discharge -yellowing of the eyes or skin Side effects that usually do not require medical attention (report to your doctor or health care professional if they continue or are bothersome): -acne -breast pain -change in sex drive or performance -changes in weight -cramping, dizziness, or faintness while the device is being inserted -headache -irregular menstrual bleeding within first 3 to 6 months of use -nausea This list may not describe all possible side effects. Call your doctor for medical advice about side effects. You may report side effects to FDA at 1-800-FDA-1088. Where should I keep my medicine? This does not apply. NOTE: This sheet is a summary. It may not cover all possible information. If you have questions about this medicine, talk to your doctor, pharmacist, or health care provider.  2014, Elsevier/Gold Standard. (2011-11-16 13:54:04) Contraception Choices Contraception (birth control) is the use of any methods or devices to prevent pregnancy. Below are some methods to help avoid pregnancy. HORMONAL METHODS   Contraceptive implant This is a thin, plastic tube containing progesterone hormone. It does not contain estrogen hormone. Your health care provider inserts the tube in the inner part of the upper arm. The tube can remain in place for up to 3 years. After 3 years, the implant must be removed. The implant prevents the ovaries from releasing an egg (ovulation), thickens the cervical mucus to prevent sperm  from entering the uterus, and thins the lining of the inside of the uterus.  Progesterone-only injections These injections are given every 3 months by your health care provider to prevent pregnancy. This synthetic progesterone hormone stops the ovaries from releasing eggs. It also thickens cervical mucus and changes the uterine lining. This makes it harder for sperm to survive in the uterus.  Birth control pills These pills contain estrogen and progesterone hormone. They work by preventing the ovaries from releasing eggs (ovulation). They also cause the cervical mucus to thicken, preventing the sperm from entering the uterus. Birth control pills are prescribed by a health care provider.Birth control pills can also be used to treat heavy periods.  Minipill This type of birth control pill contains only the progesterone hormone. They are taken every day of each month and must be prescribed by your health care provider.  Birth control patch The patch contains hormones similar to those in birth control pills. It must be changed once a week and is prescribed by a health care provider.  Vaginal ring The ring contains hormones similar to those in birth control pills. It is left in the vagina for 3 weeks, removed for 1 week, and then a new one is put back in place. The patient must be comfortable inserting and removing the ring from the vagina.A health care provider's prescription is necessary.  Emergency contraception Emergency contraceptives prevent pregnancy after unprotected sexual intercourse. This pill can be taken right after sex or up to 5 days after unprotected sex. It is most effective the sooner you take the pills after having sexual intercourse. Most emergency contraceptive pills   are available without a prescription. Check with your pharmacist. Do not use emergency contraception as your only form of birth control. BARRIER METHODS   Female condom This is a thin sheath (latex or rubber) that is worn  over the penis during sexual intercourse. It can be used with spermicide to increase effectiveness.  Female condom. This is a soft, loose-fitting sheath that is put into the vagina before sexual intercourse.  Diaphragm This is a soft, latex, dome-shaped barrier that must be fitted by a health care provider. It is inserted into the vagina, along with a spermicidal jelly. It is inserted before intercourse. The diaphragm should be left in the vagina for 6 to 8 hours after intercourse.  Cervical cap This is a round, soft, latex or plastic cup that fits over the cervix and must be fitted by a health care provider. The cap can be left in place for up to 48 hours after intercourse.  Sponge This is a soft, circular piece of polyurethane foam. The sponge has spermicide in it. It is inserted into the vagina after wetting it and before sexual intercourse.  Spermicides These are chemicals that kill or block sperm from entering the cervix and uterus. They come in the form of creams, jellies, suppositories, foam, or tablets. They do not require a prescription. They are inserted into the vagina with an applicator before having sexual intercourse. The process must be repeated every time you have sexual intercourse. INTRAUTERINE CONTRACEPTION  Intrauterine device (IUD) This is a T-shaped device that is put in a woman's uterus during a menstrual period to prevent pregnancy. There are 2 types:  Copper IUD This type of IUD is wrapped in copper wire and is placed inside the uterus. Copper makes the uterus and fallopian tubes produce a fluid that kills sperm. It can stay in place for 10 years.  Hormone IUD This type of IUD contains the hormone progestin (synthetic progesterone). The hormone thickens the cervical mucus and prevents sperm from entering the uterus, and it also thins the uterine lining to prevent implantation of a fertilized egg. The hormone can weaken or kill the sperm that get into the uterus. It can stay  in place for 3 5 years, depending on which type of IUD is used. PERMANENT METHODS OF CONTRACEPTION  Female tubal ligation This is when the woman's fallopian tubes are surgically sealed, tied, or blocked to prevent the egg from traveling to the uterus.  Hysteroscopic sterilization This involves placing a small coil or insert into each fallopian tube. Your doctor uses a technique called hysteroscopy to do the procedure. The device causes scar tissue to form. This results in permanent blockage of the fallopian tubes, so the sperm cannot fertilize the egg. It takes about 3 months after the procedure for the tubes to become blocked. You must use another form of birth control for these 3 months.  Female sterilization This is when the female has the tubes that carry sperm tied off (vasectomy).This blocks sperm from entering the vagina during sexual intercourse. After the procedure, the man can still ejaculate fluid (semen). NATURAL PLANNING METHODS  Natural family planning This is not having sexual intercourse or using a barrier method (condom, diaphragm, cervical cap) on days the woman could become pregnant.  Calendar method This is keeping track of the length of each menstrual cycle and identifying when you are fertile.  Ovulation method This is avoiding sexual intercourse during ovulation.  Symptothermal method This is avoiding sexual intercourse during ovulation, using a   thermometer and ovulation symptoms.  Post ovulation method This is timing sexual intercourse after you have ovulated. Regardless of which type or method of contraception you choose, it is important that you use condoms to protect against the transmission of sexually transmitted infections (STIs). Talk with your health care provider about which form of contraception is most appropriate for you. Document Released: 10/16/2005 Document Revised: 06/18/2013 Document Reviewed: 04/10/2013 ExitCare Patient Information 2014 [employer].  

## 2013-10-03 NOTE — Progress Notes (Signed)
Ultrasound can see her today at 1400.  Pt notified.

## 2013-10-04 LAB — GC/CHLAMYDIA PROBE AMP
CT Probe RNA: NEGATIVE
GC Probe RNA: NEGATIVE

## 2013-10-04 LAB — OB RESULTS CONSOLE GC/CHLAMYDIA
Chlamydia: NEGATIVE
GC PROBE AMP, GENITAL: NEGATIVE

## 2013-10-04 LAB — WET PREP, GENITAL

## 2013-10-05 LAB — OB RESULTS CONSOLE GBS: GBS: POSITIVE

## 2013-10-07 ENCOUNTER — Telehealth: Payer: Self-pay

## 2013-10-07 NOTE — Telephone Encounter (Signed)
Pt called and stated that she had an appt on Friday and have not received her results nor did her Rx get sent to pharmacy.

## 2013-10-07 NOTE — Telephone Encounter (Signed)
Called patient, no answer- left message that we are trying to return your phone call, please call us back at the clinics 

## 2013-10-08 NOTE — Telephone Encounter (Signed)
Called patient stating I was returning her phone call and reviewed results with her, patient was very confused about results and Rx for diflucan despite multiple attempts of explaining, became agitated and hung up.

## 2013-10-17 ENCOUNTER — Encounter: Payer: Medicaid Other | Admitting: Advanced Practice Midwife

## 2013-10-19 ENCOUNTER — Telehealth: Payer: Self-pay | Admitting: Obstetrics and Gynecology

## 2013-10-19 NOTE — Telephone Encounter (Signed)
Pt called with concerns regarding the dosing of diflucan. It was prescribed for 30 tabs. Pt did not have yeast on wet prep however complains of severe vaginal itching. I instructed pt to take 1 tab times 3 doses and if symptoms have not resolved to call the clinic for further instructions.

## 2013-10-29 ENCOUNTER — Encounter (HOSPITAL_COMMUNITY): Payer: Self-pay | Admitting: Family

## 2013-10-29 ENCOUNTER — Inpatient Hospital Stay (HOSPITAL_COMMUNITY)
Admission: AD | Admit: 2013-10-29 | Discharge: 2013-10-29 | Disposition: A | Discharge status: Home / Self Care | Payer: Medicaid Other | Source: Ambulatory Visit | Attending: Obstetrics & Gynecology | Admitting: Obstetrics & Gynecology

## 2013-10-29 DIAGNOSIS — O479 False labor, unspecified: Secondary | ICD-10-CM | POA: Insufficient documentation

## 2013-10-29 NOTE — MAU Note (Signed)
Pt. States water broke at 0300.  Pt. States that uc's are 5 minutes apart.

## 2013-10-29 NOTE — MAU Note (Signed)
27 yo, G7P3 at [redacted]w[redacted]d, presents to MAU with c/o contractions every 4-5 minutes since 0300. Reports questionable clear fluid leaking since Sunday. Pink-tinged blood show when wiping. Denies HSV.  Reports +fm. Prior vag deliveries.

## 2013-10-30 NOTE — L&D Delivery Note (Signed)
Delivery Note At 5:54 AM a viable female was delivered via  (Presentation: ;  ).  APGAR:9 ,9 ; weight .   Placenta status:spont via shultz , .  Cord: 3 vc with the following complications:none .  Cord pH: n/a  Anesthesia:none   Episiotomy: none Lacerations: none Suture Repair: n/a Est. Blood Loss400 (mL):   Mom to postpartum.  Baby to Couplet care / Skin to Skin.  Wyvonnia DuskyLAWSON, Margaret Knight 11/02/2013, 6:34 AM

## 2013-11-02 ENCOUNTER — Encounter (HOSPITAL_COMMUNITY): Payer: Self-pay | Admitting: Obstetrics

## 2013-11-02 ENCOUNTER — Inpatient Hospital Stay (HOSPITAL_COMMUNITY)
Admission: AD | Admit: 2013-11-02 | Discharge: 2013-11-03 | DRG: 775 | Disposition: A | Discharge status: Home / Self Care | Payer: Medicaid Other | Source: Ambulatory Visit | Attending: Family Medicine | Admitting: Family Medicine

## 2013-11-02 DIAGNOSIS — O99892 Other specified diseases and conditions complicating childbirth: Secondary | ICD-10-CM

## 2013-11-02 DIAGNOSIS — Z22330 Carrier of Group B streptococcus: Secondary | ICD-10-CM

## 2013-11-02 DIAGNOSIS — IMO0001 Reserved for inherently not codable concepts without codable children: Secondary | ICD-10-CM

## 2013-11-02 DIAGNOSIS — O9989 Other specified diseases and conditions complicating pregnancy, childbirth and the puerperium: Principal | ICD-10-CM

## 2013-11-02 LAB — CBC
HEMATOCRIT: 32.4 % — AB (ref 36.0–46.0)
HEMOGLOBIN: 10.7 g/dL — AB (ref 12.0–15.0)
MCH: 28.2 pg (ref 26.0–34.0)
MCHC: 33 g/dL (ref 30.0–36.0)
MCV: 85.3 fL (ref 78.0–100.0)
Platelets: 198 10*3/uL (ref 150–400)
RBC: 3.8 MIL/uL — AB (ref 3.87–5.11)
RDW: 14.8 % (ref 11.5–15.5)
WBC: 5.7 10*3/uL (ref 4.0–10.5)

## 2013-11-02 LAB — TYPE AND SCREEN
ABO/RH(D): O POS
ANTIBODY SCREEN: NEGATIVE

## 2013-11-02 LAB — RPR: RPR Ser Ql: NONREACTIVE

## 2013-11-02 MED ORDER — LACTATED RINGERS IV SOLN
INTRAVENOUS | Status: DC
Start: 2013-11-02 — End: 2013-11-02
  Administered 2013-11-02: 06:00:00 via INTRAVENOUS

## 2013-11-02 MED ORDER — SODIUM CHLORIDE 0.9 % IJ SOLN: 3.0000 mL | Freq: Two times a day (BID) | INTRAMUSCULAR | Status: DC

## 2013-11-02 MED ORDER — TETANUS-DIPHTH-ACELL PERTUSSIS 5-2.5-18.5 LF-MCG/0.5 IM SUSP: 0.5000 mL | Freq: Once | INTRAMUSCULAR | Status: DC

## 2013-11-02 MED ORDER — SIMETHICONE 80 MG PO CHEW
80.0000 mg | CHEWABLE_TABLET | ORAL | Status: DC | PRN
  Administered 2013-11-02: 80 mg via ORAL
  Filled 2013-11-02: qty 1

## 2013-11-02 MED ORDER — SODIUM CHLORIDE 0.9 % IV SOLN: 250.0000 mL | INTRAVENOUS | Status: DC | PRN

## 2013-11-02 MED ORDER — ONDANSETRON HCL 4 MG PO TABS: 4.0000 mg | ORAL_TABLET | ORAL | Status: DC | PRN

## 2013-11-02 MED ORDER — CITRIC ACID-SODIUM CITRATE 334-500 MG/5ML PO SOLN: 30.0000 mL | ORAL | Status: DC | PRN

## 2013-11-02 MED ORDER — LANOLIN HYDROUS EX OINT: TOPICAL_OINTMENT | CUTANEOUS | Status: DC | PRN

## 2013-11-02 MED ORDER — SODIUM CHLORIDE 0.9 % IV SOLN
2.0000 g | Freq: Once | INTRAVENOUS | Status: DC
  Filled 2013-11-02: qty 2000

## 2013-11-02 MED ORDER — PRENATAL MULTIVITAMIN CH
1.0000 | ORAL_TABLET | Freq: Every day | ORAL | Status: DC
  Administered 2013-11-03: 1 via ORAL
  Filled 2013-11-02: qty 1

## 2013-11-02 MED ORDER — SENNOSIDES-DOCUSATE SODIUM 8.6-50 MG PO TABS
2.0000 | ORAL_TABLET | ORAL | Status: DC
  Administered 2013-11-02: 2 via ORAL
  Filled 2013-11-02: qty 2

## 2013-11-02 MED ORDER — WITCH HAZEL-GLYCERIN EX PADS: 1.0000 "application " | MEDICATED_PAD | CUTANEOUS | Status: DC | PRN

## 2013-11-02 MED ORDER — IBUPROFEN 600 MG PO TABS
600.0000 mg | ORAL_TABLET | Freq: Four times a day (QID) | ORAL | Status: DC
  Administered 2013-11-02 – 2013-11-03 (×5): 600 mg via ORAL
  Filled 2013-11-02 (×6): qty 1

## 2013-11-02 MED ORDER — DIPHENHYDRAMINE HCL 25 MG PO CAPS: 25.0000 mg | ORAL_CAPSULE | Freq: Four times a day (QID) | ORAL | Status: DC | PRN

## 2013-11-02 MED ORDER — FLEET ENEMA 7-19 GM/118ML RE ENEM: 1.0000 | ENEMA | RECTAL | Status: DC | PRN

## 2013-11-02 MED ORDER — OXYTOCIN 40 UNITS IN LACTATED RINGERS INFUSION - SIMPLE MED
62.5000 mL/h | INTRAVENOUS | Status: DC
  Filled 2013-11-02: qty 1000

## 2013-11-02 MED ORDER — OXYCODONE-ACETAMINOPHEN 5-325 MG PO TABS: 1.0000 | ORAL_TABLET | ORAL | Status: DC | PRN

## 2013-11-02 MED ORDER — LIDOCAINE HCL (PF) 1 % IJ SOLN
INTRAMUSCULAR | Status: AC
  Filled 2013-11-02: qty 30

## 2013-11-02 MED ORDER — LIDOCAINE HCL (PF) 1 % IJ SOLN: 30.0000 mL | INTRAMUSCULAR | Status: DC | PRN

## 2013-11-02 MED ORDER — LACTATED RINGERS IV SOLN: 500.0000 mL | INTRAVENOUS | Status: DC | PRN

## 2013-11-02 MED ORDER — ONDANSETRON HCL 4 MG/2ML IJ SOLN: 4.0000 mg | Freq: Four times a day (QID) | INTRAMUSCULAR | Status: DC | PRN

## 2013-11-02 MED ORDER — BENZOCAINE-MENTHOL 20-0.5 % EX AERO
1.0000 "application " | INHALATION_SPRAY | CUTANEOUS | Status: DC | PRN
  Administered 2013-11-03: 1 via TOPICAL
  Filled 2013-11-02: qty 56

## 2013-11-02 MED ORDER — ONDANSETRON HCL 4 MG/2ML IJ SOLN: 4.0000 mg | INTRAMUSCULAR | Status: DC | PRN

## 2013-11-02 MED ORDER — DIBUCAINE 1 % RE OINT: 1.0000 "application " | TOPICAL_OINTMENT | RECTAL | Status: DC | PRN

## 2013-11-02 MED ORDER — OXYCODONE-ACETAMINOPHEN 5-325 MG PO TABS
1.0000 | ORAL_TABLET | ORAL | Status: DC | PRN
  Administered 2013-11-03 (×2): 1 via ORAL
  Filled 2013-11-02 (×4): qty 1

## 2013-11-02 MED ORDER — SODIUM CHLORIDE 0.9 % IJ SOLN: 3.0000 mL | INTRAMUSCULAR | Status: DC | PRN

## 2013-11-02 MED ORDER — OXYTOCIN 40 UNITS IN LACTATED RINGERS INFUSION - SIMPLE MED: 62.5000 mL/h | INTRAVENOUS | Status: DC | PRN

## 2013-11-02 MED ORDER — ACETAMINOPHEN 325 MG PO TABS
650.0000 mg | ORAL_TABLET | Freq: Four times a day (QID) | ORAL | Status: DC | PRN
  Administered 2013-11-02 (×2): 650 mg via ORAL
  Filled 2013-11-02 (×3): qty 2

## 2013-11-02 MED ORDER — ACETAMINOPHEN 325 MG PO TABS: 650.0000 mg | ORAL_TABLET | ORAL | Status: DC | PRN

## 2013-11-02 MED ORDER — IBUPROFEN 600 MG PO TABS: 600.0000 mg | ORAL_TABLET | Freq: Four times a day (QID) | ORAL | Status: DC | PRN

## 2013-11-02 MED ORDER — OXYTOCIN BOLUS FROM INFUSION
500.0000 mL | INTRAVENOUS | Status: DC
  Administered 2013-11-02: 500 mL via INTRAVENOUS

## 2013-11-02 MED ORDER — ZOLPIDEM TARTRATE 5 MG PO TABS
5.0000 mg | ORAL_TABLET | Freq: Every evening | ORAL | Status: DC | PRN
Start: 2013-11-02 — End: 2013-11-03

## 2013-11-02 MED ORDER — OXYTOCIN 10 UNIT/ML IJ SOLN
INTRAMUSCULAR | Status: AC
  Administered 2013-11-02: 06:00:00 10 [IU] via INTRAMUSCULAR
  Filled 2013-11-02: qty 2

## 2013-11-02 MED ORDER — OXYTOCIN 10 UNIT/ML IJ SOLN
10.0000 [IU] | Freq: Once | INTRAMUSCULAR | Status: AC
  Administered 2013-11-02: 10 [IU] via INTRAMUSCULAR

## 2013-11-02 NOTE — MAU Note (Signed)
(    Bringing pt in MAU from car via w/c)  C/o contractions, significant other reports it's pt's fourth baby, pt said she was 3 cm at last visit.

## 2013-11-02 NOTE — H&P (Signed)
Attestation of Attending Supervision of Advanced Practitioner (PA/CNM/NP): Evaluation and management procedures were performed by the Advanced Practitioner under my supervision and collaboration.  I have reviewed the Advanced Practitioner's note and chart, and I agree with the management and plan.  Reva BoresPRATT,Likisha Alles S, MD [facility] Attending 11/02/2013 7:00 AM
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
## 2013-11-02 NOTE — H&P (Signed)
Margaret Knight is a 28 y.o. female presenting for active labor. Maternal Medical History:  Reason for admission: Contractions.   Contractions: Onset was 3-5 hours ago.   Frequency: regular.   Perceived severity is moderate.    Fetal activity: Perceived fetal activity is normal.   Last perceived fetal movement was within the past hour.      OB History   Grav Para Term Preterm Abortions TAB SAB Ect Mult Living   7 3 3  0 3 1 2  0 0 3     Past Medical History  Diagnosis Date  . Migraines   . History of recurrent UTI (urinary tract infection)   . Normal pregnancy, repeat 03/09/2012  . SVD (spontaneous vaginal delivery) 03/10/2012   Past Surgical History  Procedure Laterality Date  . No past surgeries     Family History: family history includes Cancer in her mother; Diabetes in her maternal grandmother and paternal grandmother; Hypertension in her mother. There is no history of Anesthesia problems. Social History:  reports that she has never smoked. She has never used smokeless tobacco. She reports that she does not drink alcohol or use illicit drugs.   Prenatal Transfer Tool  Maternal Diabetes: No Genetic Screening: Normal Maternal Ultrasounds/Referrals: Normal Fetal Ultrasounds or other Referrals:  None Maternal Substance Abuse:  No Significant Maternal Medications:  None Significant Maternal Lab Results:  None Other Comments:  None  Review of Systems  Constitutional: Negative.   HENT: Negative.   Eyes: Negative.   Respiratory: Negative.   Cardiovascular: Negative.   Gastrointestinal: Positive for abdominal pain.  Genitourinary: Negative.   Musculoskeletal: Negative.   Skin: Negative.   Neurological: Negative.   Endo/Heme/Allergies: Negative.   Psychiatric/Behavioral: Negative.     Dilation: 10 Effacement (%): 90 Station: -1 Exam by:: D  Last menstrual period 01/28/2013. Maternal Exam:  Uterine Assessment: Contraction strength is firm.  Contraction frequency  is regular.   Abdomen: Patient reports no abdominal tenderness. Fetal presentation: vertex  Introitus: Normal vulva. Normal vagina.    Physical Exam  Constitutional: She is oriented to person, place, and time. She appears well-developed and well-nourished.  HENT:  Head: Normocephalic.  Neck: Normal range of motion.  Cardiovascular: Normal rate, regular rhythm, normal heart sounds and intact distal pulses.   Respiratory: Effort normal and breath sounds normal.  GI: Soft. Bowel sounds are normal.  Genitourinary: Vagina normal and uterus normal.  Musculoskeletal: Normal range of motion.  Neurological: She is alert and oriented to person, place, and time. She has normal reflexes.  Skin: Skin is warm and dry.  Psychiatric: She has a normal mood and affect. Her behavior is normal. Judgment and thought content normal.    Prenatal labs: ABO, Rh:   Antibody:   Rubella:   RPR: NON REAC (11/19 1153)  HBsAg:    HIV: NON REACTIVE (11/19 1153)  GBS: Positive (12/07 0000)   Assessment/Plan: avtive labor,impending delivery   [name], MARIE DARLENE 11/02/2013, 6:25 AM

## 2013-11-03 MED ORDER — IBUPROFEN 600 MG PO TABS: 600.0000 mg | ORAL_TABLET | Freq: Four times a day (QID) | ORAL | Status: DC

## 2013-11-03 NOTE — Discharge Summary (Signed)
Obstetric Discharge Summary Reason for Admission: onset of labor Prenatal Procedures: NST Intrapartum Procedures: spontaneous vaginal delivery Postpartum Procedures: none Complications-Operative and Postpartum: none Hemoglobin  Date Value Range Status  11/02/2013 10.7* 12.0 - 15.0 g/dL Final     HCT  Date Value Range Status  11/02/2013 32.4* 36.0 - 46.0 % Final    Physical Exam:  General: alert, cooperative and no distress Lochia: appropriate Uterine Fundus: firm Incision: na DVT Evaluation: No evidence of DVT seen on physical exam. No cords or calf tenderness. No significant calf/ankle edema.  Discharge Diagnoses: Term Pregnancy-delivered  Discharge Information: Date: 11/03/2013 Activity: pelvic rest Diet: routine Medications: PNV and Ibuprofen Condition: stable Instructions: refer to practice specific booklet Discharge to: home Follow-up Information   [facility] Schedule an appointment as soon as possible for a visit in 4 weeks. (for a postpartum visit)    Specialty:  Obstetrics and Gynecology   Contact information:   [address] [place] [id number] [phone]      Newborn Data: Live born female  Birth Weight: 7 lb 10.9 oz (3485 g) APGAR: 9, 9  Home with mother.  Pt presented in active labor at term and progressed quickly to deliver a liveborn female via SVD. No complications during delivery. Postpartum care has been uncomplicated. She is breast feeding and desires nexplanon for contraception.    Alandria Butkiewicz L 11/03/2013, 7:47 AM

## 2013-11-03 NOTE — Discharge Instructions (Signed)

## 2013-11-03 NOTE — Progress Notes (Signed)
UR chart review completed.  

## 2013-11-03 NOTE — Lactation Note (Signed)
This note was copied from the chart of Girl Dickie LaShimika Ivey. Lactation Consultation Note: Mom reports that the baby has just fed for 15 minutes on the right breast. Still rooting so mom latched her to left breast by herself. Experienced BF mom. Has given some formula already- encouraged to always BF first to promote a good milk supply. No questions at present, BF brochure given to mom with resources for support after DC. To call prn  Patient Name: Girl Dickie LaShimika Ivey ZOXWR'UToday's Date: 11/03/2013 Reason for consult: Initial assessment   Maternal Data Formula Feeding for Exclusion: Yes Reason for exclusion: Mother's choice to formula and breast feed on admission Infant to breast within first hour of birth: Yes Does the patient have breastfeeding experience prior to this delivery?: Yes  Feeding Feeding Type: Breast Fed Length of feed: 20 min  LATCH Score/Interventions Latch: Grasps breast easily, tongue down, lips flanged, rhythmical sucking. Intervention(s): Assist with latch  Audible Swallowing: A few with stimulation  Type of Nipple: Everted at rest and after stimulation  Comfort (Breast/Nipple): Soft / non-tender     Hold (Positioning): No assistance needed to correctly position infant at breast.  LATCH Score: 9  Lactation Tools Discussed/Used     Consult Status Consult Status: PRN    Pamelia HoitWeeks, Phila Shoaf D 11/03/2013, 5:09 PM

## 2013-11-11 NOTE — Discharge Summary (Signed)
Attestation of Attending Supervision of Fellow: Evaluation and management procedures were performed by the Fellow under my supervision and collaboration.  I have reviewed the Fellow's note and chart, and I agree with the management and plan.    

## 2013-12-02 ENCOUNTER — Telehealth: Payer: Self-pay | Admitting: *Deleted

## 2013-12-02 ENCOUNTER — Encounter: Payer: Self-pay | Admitting: *Deleted

## 2013-12-02 NOTE — Telephone Encounter (Signed)
Pt called nurse requesting a note to return work.  Spoke with patient and she needs note stating that she was place out of work from 10/04/2013-12/18/13, her six week check up.  Letter typed and pt is to come pick it up/

## 2013-12-17 ENCOUNTER — Ambulatory Visit: Payer: Medicaid Other | Admitting: Advanced Practice Midwife

## 2014-04-19 ENCOUNTER — Inpatient Hospital Stay (HOSPITAL_COMMUNITY): Payer: Medicaid Other

## 2014-04-19 ENCOUNTER — Inpatient Hospital Stay (HOSPITAL_COMMUNITY)
Admission: AD | Admit: 2014-04-19 | Discharge: 2014-04-19 | Disposition: A | Discharge status: Home / Self Care | Payer: Medicaid Other | Source: Ambulatory Visit | Attending: Obstetrics & Gynecology | Admitting: Obstetrics & Gynecology

## 2014-04-19 ENCOUNTER — Encounter (HOSPITAL_COMMUNITY): Payer: Self-pay | Admitting: *Deleted

## 2014-04-19 DIAGNOSIS — O9989 Other specified diseases and conditions complicating pregnancy, childbirth and the puerperium: Secondary | ICD-10-CM

## 2014-04-19 DIAGNOSIS — R102 Pelvic and perineal pain: Secondary | ICD-10-CM

## 2014-04-19 DIAGNOSIS — O99891 Other specified diseases and conditions complicating pregnancy: Secondary | ICD-10-CM | POA: Insufficient documentation

## 2014-04-19 DIAGNOSIS — N949 Unspecified condition associated with female genital organs and menstrual cycle: Secondary | ICD-10-CM

## 2014-04-19 DIAGNOSIS — O26891 Other specified pregnancy related conditions, first trimester: Secondary | ICD-10-CM

## 2014-04-19 LAB — WET PREP, GENITAL
Clue Cells Wet Prep HPF POC: NONE SEEN
TRICH WET PREP: NONE SEEN
Yeast Wet Prep HPF POC: NONE SEEN

## 2014-04-19 LAB — CBC
HEMATOCRIT: 32.8 % — AB (ref 36.0–46.0)
HEMOGLOBIN: 10.7 g/dL — AB (ref 12.0–15.0)
MCH: 27 pg (ref 26.0–34.0)
MCHC: 32.6 g/dL (ref 30.0–36.0)
MCV: 82.6 fL (ref 78.0–100.0)
Platelets: 343 10*3/uL (ref 150–400)
RBC: 3.97 MIL/uL (ref 3.87–5.11)
RDW: 17.3 % — AB (ref 11.5–15.5)
WBC: 4.2 10*3/uL (ref 4.0–10.5)

## 2014-04-19 LAB — URINE MICROSCOPIC-ADD ON

## 2014-04-19 LAB — URINALYSIS, ROUTINE W REFLEX MICROSCOPIC
Bilirubin Urine: NEGATIVE
Glucose, UA: NEGATIVE mg/dL
Hgb urine dipstick: NEGATIVE
Ketones, ur: NEGATIVE mg/dL
Nitrite: NEGATIVE
PH: 6 (ref 5.0–8.0)
Protein, ur: NEGATIVE mg/dL
SPECIFIC GRAVITY, URINE: 1.02 (ref 1.005–1.030)
Urobilinogen, UA: 1 mg/dL (ref 0.0–1.0)

## 2014-04-19 LAB — HCG, QUANTITATIVE, PREGNANCY: hCG, Beta Chain, Quant, S: 31537 m[IU]/mL — ABNORMAL HIGH (ref <5)

## 2014-04-19 LAB — POCT PREGNANCY, URINE: Preg Test, Ur: POSITIVE — AB

## 2014-04-19 NOTE — MAU Provider Note (Signed)
History     CSN: [id number]  Arrival date and time: 04/19/14 1458   First Anne Boltz Initiated Contact with Patient 04/19/14 1524      Chief Complaint  Patient presents with  . Possible Pregnancy   Possible Pregnancy    NEL STONEKING is a 28 y.o. [id number] at Unknown who presents today with cramping, and discharge. She states that she has not had a period since January 4th after the birth of her last child. She states that in May she was nauseous, and at the beginning of June she had a +HPT. She has an appointment in the clinic for July 20th. She denies any bleeding.   Past Medical History  Diagnosis Date  . Migraines   . History of recurrent UTI (urinary tract infection)   . Normal pregnancy, repeat 03/09/2012  . SVD (spontaneous vaginal delivery) 03/10/2012    Past Surgical History  Procedure Laterality Date  . No past surgeries      Family History  Problem Relation Age of Onset  . Anesthesia problems Neg Hx   . Cancer Mother     breast  . Hypertension Mother   . Diabetes Maternal Grandmother   . Diabetes Paternal Grandmother     History  Substance Use Topics  . Smoking status: Never Smoker   . Smokeless tobacco: Never Used  . Alcohol Use: No    Allergies: No Known Allergies  No prescriptions prior to admission    ROS Physical Exam   Blood pressure 122/68, pulse 81, temperature 98.2 F (36.8 C), temperature source Oral, resp. rate 16, height 5\' 7"  (1.702 m), weight 68.04 kg (150 lb), last menstrual period 01/28/2013, SpO2 100.00%, unknown if currently breastfeeding.  Physical Exam  Nursing note and vitals reviewed. Constitutional: She is oriented to person, place, and time. She appears well-developed and well-nourished. No distress.  Cardiovascular: Normal rate.   Respiratory: Effort normal.  GI: Soft. There is no tenderness.  Genitourinary:   External: no lesion Vagina: small amount of white discharge Cervix: pink, smooth, no CMT Uterus: slightly  enlarged  Adnexa: NT   Neurological: She is alert and oriented to person, place, and time.  Skin: Skin is warm and dry.  Psychiatric: She has a normal mood and affect.    MAU Course  Procedures  Results for orders placed during the hospital encounter of 04/19/14 (from the past 24 hour(s))  URINALYSIS, ROUTINE W REFLEX MICROSCOPIC     Status: Abnormal   Collection Time    04/19/14  3:00 PM      Result Value Ref Range   Color, Urine YELLOW  YELLOW   APPearance CLEAR  CLEAR   Specific Gravity, Urine 1.020  1.005 - 1.030   pH 6.0  5.0 - 8.0   Glucose, UA NEGATIVE  NEGATIVE mg/dL   Hgb urine dipstick NEGATIVE  NEGATIVE   Bilirubin Urine NEGATIVE  NEGATIVE   Ketones, ur NEGATIVE  NEGATIVE mg/dL   Protein, ur NEGATIVE  NEGATIVE mg/dL   Urobilinogen, UA 1.0  0.0 - 1.0 mg/dL   Nitrite NEGATIVE  NEGATIVE   Leukocytes, UA MODERATE (*) NEGATIVE  URINE MICROSCOPIC-ADD ON     Status: Abnormal   Collection Time    04/19/14  3:00 PM      Result Value Ref Range   Squamous Epithelial / LPF MANY (*) RARE   WBC, UA 11-20  <3 WBC/hpf   RBC / HPF 0-2  <3 RBC/hpf   Bacteria, UA MANY (*) RARE  Urine-Other MUCOUS PRESENT    POCT PREGNANCY, URINE     Status: Abnormal   Collection Time    04/19/14  3:18 PM      Result Value Ref Range   Preg Test, Ur POSITIVE (*) NEGATIVE  WET PREP, GENITAL     Status: Abnormal   Collection Time    04/19/14  3:27 PM      Result Value Ref Range   Yeast Wet Prep HPF POC NONE SEEN  NONE SEEN   Trich, Wet Prep NONE SEEN  NONE SEEN   Clue Cells Wet Prep HPF POC NONE SEEN  NONE SEEN   WBC, Wet Prep HPF POC FEW (*) NONE SEEN  CBC     Status: Abnormal   Collection Time    04/19/14  3:38 PM      Result Value Ref Range   WBC 4.2  4.0 - 10.5 K/uL   RBC 3.97  3.87 - 5.11 MIL/uL   Hemoglobin 10.7 (*) 12.0 - 15.0 g/dL   HCT 73.232.8 (*) 20.236.0 - 54.246.0 %   MCV 82.6  78.0 - 100.0 fL   MCH 27.0  26.0 - 34.0 pg   MCHC 32.6  30.0 - 36.0 g/dL   RDW 70.617.3 (*) 23.711.5 - 62.815.5 %    Platelets 343  150 - 400 K/uL  HCG, QUANTITATIVE, PREGNANCY     Status: Abnormal   Collection Time    04/19/14  3:38 PM      Result Value Ref Range   hCG, Beta Chain, Quant, [place] [id number] (*) <5 mIU/mL   [place] Ob Comp Less 14 Wks  04/19/2014   CLINICAL DATA:  28 year old pregnant female with pelvic pain and cramping.  EXAM: OBSTETRIC <14 WK US AND TRANSVAGINAL OB US  TECHNIQUE: Both transabdominal and transvaginal ultrasound examinations were performed for complete evaluation of the gestation as well as the maternal uterus, adnexal regions, and pelvic cul-de-sac. Transvaginal technique was performed to assess early pregnancy.  COMPARISON:  None.  FINDINGS: Intrauterine gestational sac: Visualized/normal in shape.  Yolk sac:  Visualized  Embryo:  Visualized  Cardiac Activity: Visualized  Heart Rate:  129 bpm  CRL:   6.2  mm   6 w 4 d                  US EDC: 12/09/2014  Maternal uterus/adnexae: There is no evidence of subchorionic hemorrhage.  The left ovary is unremarkable.  A 3 cm right ovarian simple cyst is noted.  A trace amount of free fluid is noted.  There is no evidence of solid adnexal mass.  IMPRESSION: Single living intrauterine gestation with estimated gestational age of [redacted] weeks 4 days by this ultrasound. No evidence of subchorionic hemorrhage.  Trace amount of free pelvic fluid and 3 cm right ovarian simple cyst.   Electronically Signed   By: Laveda AbbeJeff  Hu M.D.   On: 04/19/2014 16:31   [place] Ob Transvaginal  04/19/2014   CLINICAL DATA:  28 year old pregnant female with pelvic pain and cramping.  EXAM: OBSTETRIC <14 WK US AND TRANSVAGINAL OB US  TECHNIQUE: Both transabdominal and transvaginal ultrasound examinations were performed for complete evaluation of the gestation as well as the maternal uterus, adnexal regions, and pelvic cul-de-sac. Transvaginal technique was performed to assess early pregnancy.  COMPARISON:  None.  FINDINGS: Intrauterine gestational sac: Visualized/normal in shape.  Yolk sac:   Visualized  Embryo:  Visualized  Cardiac Activity: Visualized  Heart Rate:  129 bpm  CRL:  6.2  mm   6 w 4 d                  US EDC: 12/09/2014  Maternal uterus/adnexae: There is no evidence of subchorionic hemorrhage.  The left ovary is unremarkable.  A 3 cm right ovarian simple cyst is noted.  A trace amount of free fluid is noted.  There is no evidence of solid adnexal mass.  IMPRESSION: Single living intrauterine gestation with estimated gestational age of [redacted] weeks 4 days by this ultrasound. No evidence of subchorionic hemorrhage.  Trace amount of free pelvic fluid and 3 cm right ovarian simple cyst.   Electronically Signed   By: Laveda AbbeJeff  Hu M.D.   On: 04/19/2014 16:31     Assessment and Plan   1. Pelvic pain affecting pregnancy in first trimester, antepartum    Bleeding precautions reviewed First trimester danger signs reviewed  [facility] to [facility] as needed  Follow-up Information   [facility] (As scheduled)    Specialty:  Obstetrics and Gynecology   Contact information:   [address] [place] [address] [phone]       Tawnya CrookHogan, Heather Donovan 04/19/2014, 3:33 PM

## 2014-04-19 NOTE — MAU Note (Signed)
Patient presents to MAU with c/o +HPT at the beginning of June; states has not had a period since delivered on Jan 4th. Reports an odor to urine and white thin vaginal discharge. States having lower abdominal cramping.

## 2014-04-19 NOTE — Discharge Instructions (Signed)

## 2014-04-20 LAB — GC/CHLAMYDIA PROBE AMP
CT Probe RNA: NEGATIVE
GC PROBE AMP APTIMA: NEGATIVE

## 2014-05-10 ENCOUNTER — Inpatient Hospital Stay (HOSPITAL_COMMUNITY)
Admission: AD | Admit: 2014-05-10 | Discharge: 2014-05-12 | DRG: 781 | Disposition: A | Discharge status: Home / Self Care | Payer: Medicaid Other | Source: Ambulatory Visit | Attending: Obstetrics & Gynecology | Admitting: Obstetrics & Gynecology

## 2014-05-10 ENCOUNTER — Encounter (HOSPITAL_COMMUNITY): Payer: Self-pay | Admitting: *Deleted

## 2014-05-10 DIAGNOSIS — Z803 Family history of malignant neoplasm of breast: Secondary | ICD-10-CM

## 2014-05-10 DIAGNOSIS — O210 Mild hyperemesis gravidarum: Secondary | ICD-10-CM | POA: Diagnosis present

## 2014-05-10 DIAGNOSIS — Z833 Family history of diabetes mellitus: Secondary | ICD-10-CM

## 2014-05-10 DIAGNOSIS — Z8249 Family history of ischemic heart disease and other diseases of the circulatory system: Secondary | ICD-10-CM

## 2014-05-10 DIAGNOSIS — R630 Anorexia: Secondary | ICD-10-CM | POA: Diagnosis present

## 2014-05-10 DIAGNOSIS — O2390 Unspecified genitourinary tract infection in pregnancy, unspecified trimester: Principal | ICD-10-CM | POA: Diagnosis present

## 2014-05-10 DIAGNOSIS — N12 Tubulo-interstitial nephritis, not specified as acute or chronic: Secondary | ICD-10-CM | POA: Diagnosis present

## 2014-05-10 DIAGNOSIS — Z87440 Personal history of urinary (tract) infections: Secondary | ICD-10-CM

## 2014-05-10 DIAGNOSIS — O2301 Infections of kidney in pregnancy, first trimester: Secondary | ICD-10-CM | POA: Diagnosis present

## 2014-05-10 LAB — URINALYSIS, ROUTINE W REFLEX MICROSCOPIC
Bilirubin Urine: NEGATIVE
Glucose, UA: NEGATIVE mg/dL
Ketones, ur: 15 mg/dL — AB
Leukocytes, UA: NEGATIVE
Nitrite: POSITIVE — AB
Protein, ur: NEGATIVE mg/dL
Specific Gravity, Urine: 1.015 (ref 1.005–1.030)
Urobilinogen, UA: 1 mg/dL (ref 0.0–1.0)
pH: 6 (ref 5.0–8.0)

## 2014-05-10 LAB — URINE MICROSCOPIC-ADD ON

## 2014-05-10 LAB — CBC WITH DIFFERENTIAL/PLATELET
Basophils Absolute: 0 10*3/uL (ref 0.0–0.1)
Basophils Relative: 0 % (ref 0–1)
Eosinophils Absolute: 0 10*3/uL (ref 0.0–0.7)
Eosinophils Relative: 0 % (ref 0–5)
HCT: 30.8 % — ABNORMAL LOW (ref 36.0–46.0)
Hemoglobin: 10.3 g/dL — ABNORMAL LOW (ref 12.0–15.0)
Lymphocytes Relative: 11 % — ABNORMAL LOW (ref 12–46)
Lymphs Abs: 1.5 10*3/uL (ref 0.7–4.0)
MCH: 27.5 pg (ref 26.0–34.0)
MCHC: 33.4 g/dL (ref 30.0–36.0)
MCV: 82.4 fL (ref 78.0–100.0)
Monocytes Absolute: 1.9 10*3/uL — ABNORMAL HIGH (ref 0.1–1.0)
Monocytes Relative: 13 % — ABNORMAL HIGH (ref 3–12)
Neutro Abs: 10.5 10*3/uL — ABNORMAL HIGH (ref 1.7–7.7)
Neutrophils Relative %: 76 % (ref 43–77)
Platelets: 390 10*3/uL (ref 150–400)
RBC: 3.74 MIL/uL — ABNORMAL LOW (ref 3.87–5.11)
RDW: 17.1 % — ABNORMAL HIGH (ref 11.5–15.5)
WBC: 13.9 10*3/uL — ABNORMAL HIGH (ref 4.0–10.5)

## 2014-05-10 MED ORDER — ACETAMINOPHEN 500 MG PO TABS
1000.0000 mg | ORAL_TABLET | Freq: Once | ORAL | Status: AC
  Administered 2014-05-10: 1000 mg via ORAL
  Filled 2014-05-10: qty 2

## 2014-05-10 MED ORDER — PRENATAL MULTIVITAMIN CH
1.0000 | ORAL_TABLET | Freq: Every day | ORAL | Status: DC
  Administered 2014-05-10 – 2014-05-11 (×2): 1 via ORAL
  Filled 2014-05-10 (×3): qty 1

## 2014-05-10 MED ORDER — OXYCODONE-ACETAMINOPHEN 5-325 MG PO TABS
1.0000 | ORAL_TABLET | Freq: Four times a day (QID) | ORAL | Status: DC | PRN
  Administered 2014-05-10 – 2014-05-11 (×2): 1 via ORAL
  Filled 2014-05-10 (×2): qty 1

## 2014-05-10 MED ORDER — LACTATED RINGERS IV SOLN: INTRAVENOUS | Status: DC

## 2014-05-10 MED ORDER — SODIUM CHLORIDE 0.9 % IV SOLN
INTRAVENOUS | Status: DC
  Administered 2014-05-10 – 2014-05-12 (×4): via INTRAVENOUS

## 2014-05-10 MED ORDER — ACETAMINOPHEN 325 MG PO TABS
650.0000 mg | ORAL_TABLET | Freq: Four times a day (QID) | ORAL | Status: DC | PRN
  Administered 2014-05-10: 325 mg via ORAL
  Administered 2014-05-11: 650 mg via ORAL
  Filled 2014-05-10 (×2): qty 2

## 2014-05-10 MED ORDER — DEXTROSE 5 % IV SOLN
1.0000 g | Freq: Two times a day (BID) | INTRAVENOUS | Status: DC
  Administered 2014-05-10 – 2014-05-12 (×4): 1 g via INTRAVENOUS
  Filled 2014-05-10 (×5): qty 10

## 2014-05-10 NOTE — MAU Provider Note (Signed)
History     CSN: [id number]  Arrival date and time: 05/10/14 1233   First Provider Initiated Contact with Patient 05/10/14 1349      Chief Complaint  Patient presents with  . Back Pain  . Fever  . Emesis   HPI Margaret Knight is a 28 y.o. [id number] at 4222w4d. She has had vomiting and back pain x 2-3 wks. She has urinary frequency,urgency and dysuria x 2 wks. Her urine has strong odor.  She has decreased appetite, constat nausea and achey like the flu. No change in discharge, odor or itching. No bleeding or cramping. MAU visit 6/21- U/S 6 4/7 wks,, GC/CT were neg.  OB History   Grav Para Term Preterm Abortions TAB SAB Ect Mult Living   8 4 4  0 3 1 2  0 0 4      Past Medical History  Diagnosis Date  . Migraines   . History of recurrent UTI (urinary tract infection)   . Normal pregnancy, repeat 03/09/2012  . SVD (spontaneous vaginal delivery) 03/10/2012    Past Surgical History  Procedure Laterality Date  . No past surgeries      Family History  Problem Relation Age of Onset  . Anesthesia problems Neg Hx   . Cancer Mother     breast  . Hypertension Mother   . Diabetes Maternal Grandmother   . Diabetes Paternal Grandmother     History  Substance Use Topics  . Smoking status: Never Smoker   . Smokeless tobacco: Never Used  . Alcohol Use: No    Allergies: No Known Allergies  No prescriptions prior to admission  BP 116/61  Pulse 123  Temp(Src) 102.7 F (39.3 C) (Oral)  Resp 18  Ht 5\' 6"  (1.676 m)  Wt 152 lb (68.947 kg)  BMI 24.55 kg/m2  SpO2 100%  LMP 01/28/2013   Review of Systems  Constitutional: Positive for fever, chills and malaise/fatigue.  Gastrointestinal: Positive for nausea. Negative for vomiting, abdominal pain and diarrhea.  Genitourinary: Positive for dysuria, urgency, frequency and flank pain.  Musculoskeletal: Positive for myalgias.  Skin: Negative for rash.  Neurological: Positive for weakness and headaches.   Physical Exam   Blood  pressure 116/61, pulse 123, temperature 102.7 F (39.3 C), temperature source Oral, resp. rate 18, height 5\' 6"  (1.676 m), weight 152 lb (68.947 kg), last menstrual period 01/28/2013, SpO2 100.00%, unknown if currently breastfeeding.  Physical Exam  Nursing note and vitals reviewed. Constitutional: She is oriented to person, place, and time. She appears well-developed and well-nourished.  Cardiovascular: Normal rate, regular rhythm and normal heart sounds.   Respiratory: Effort normal and breath sounds normal.  GI: Soft. There is no tenderness. There is CVA tenderness.  Musculoskeletal: Normal range of motion.  Neurological: She is alert and oriented to person, place, and time.  Skin: Skin is warm and dry.  Psychiatric: She has a normal mood and affect. Her behavior is normal.    MAU Course  Procedures  MDM Results for orders placed during the hospital encounter of 05/10/14 (from the past 24 hour(s))  CBC WITH DIFFERENTIAL     Status: Abnormal   Collection Time    05/10/14  1:25 PM      Result Value Ref Range   WBC 13.9 (*) 4.0 - 10.5 K/uL   RBC 3.74 (*) 3.87 - 5.11 MIL/uL   Hemoglobin 10.3 (*) 12.0 - 15.0 g/dL   HCT 29.530.8 (*) 62.136.0 - 30.846.0 %   MCV 82.4  78.0 -  100.0 fL   MCH 27.5  26.0 - 34.0 pg   MCHC 33.4  30.0 - 36.0 g/dL   RDW 56.2 (*) 13.0 - 86.5 %   Platelets 390  150 - 400 K/uL   Neutrophils Relative % 76  43 - 77 %   Neutro Abs 10.5 (*) 1.7 - 7.7 K/uL   Lymphocytes Relative 11 (*) 12 - 46 %   Lymphs Abs 1.5  0.7 - 4.0 K/uL   Monocytes Relative 13 (*) 3 - 12 %   Monocytes Absolute 1.9 (*) 0.1 - 1.0 K/uL   Eosinophils Relative 0  0 - 5 %   Eosinophils Absolute 0.0  0.0 - 0.7 K/uL   Basophils Relative 0  0 - 1 %   Basophils Absolute 0.0  0.0 - 0.1 K/uL    U/A pending Assessment and Plan  Pyelonephritis 9 4/7 wks Dr Marice Potter here to see pt,will admit  Simar Pothier, Olegario Messier M. 05/10/2014, 1:55 PM

## 2014-05-10 NOTE — MAU Note (Addendum)
Been sick for 2-3 wks. Can't get out of bed, been throwing up stomach lining, can't keep anything down.  Back hurts (left flank) when she breaths or moves. Urine is smelly, for 2 wks.  Fever 103 when she left home.  Had been dealing with it at home, waited for husband to be off to watch the children. Left CVA tenderness.

## 2014-05-11 DIAGNOSIS — O2390 Unspecified genitourinary tract infection in pregnancy, unspecified trimester: Principal | ICD-10-CM

## 2014-05-11 DIAGNOSIS — N12 Tubulo-interstitial nephritis, not specified as acute or chronic: Secondary | ICD-10-CM

## 2014-05-11 NOTE — Progress Notes (Signed)
Subjective: Patient reports tolerating PO.  Less nausea or vomiting. Feels better.  Objective: I have reviewed patient's vital signs, intake and output, medications, labs and microbiology.  General: alert, cooperative and appears stated age GI: soft, non-tender; bowel sounds normal; no masses,  no organomegaly Back: No CVA tenderness   Assessment/Plan: Pyelo-slow improvement--continue IV abx until 48 hours afebrile Check urine culture Blood cultures pending.  LOS: 1 day    Kaydynce Pat S 05/11/2014, 10:32 AM

## 2014-05-11 NOTE — Progress Notes (Signed)
Ur chart review completed.  

## 2014-05-12 MED ORDER — OXYCODONE-ACETAMINOPHEN 5-325 MG PO TABS: 1.0000 | ORAL_TABLET | Freq: Four times a day (QID) | ORAL | Status: DC | PRN

## 2014-05-12 MED ORDER — PROMETHAZINE HCL 25 MG PO TABS: ORAL_TABLET | ORAL | Status: DC

## 2014-05-12 MED ORDER — CEPHALEXIN 500 MG PO CAPS: 500.0000 mg | ORAL_CAPSULE | Freq: Four times a day (QID) | ORAL | Status: DC

## 2014-05-12 MED ORDER — PRENATAL MULTIVITAMIN CH: 1.0000 | ORAL_TABLET | Freq: Every day | ORAL | Status: DC

## 2014-05-12 NOTE — Discharge Summary (Signed)
Physician Discharge Summary  Patient ID: Margaret Knight MRN: [id number] DOB/AGE: 28-11-1985 28 y.o.  Admit date: 05/10/2014 Discharge date: 05/12/2014  Admission Diagnoses: pyelonephritis in pregnancy  Discharge Diagnoses: same Active Problems:   Pyelonephritis affecting pregnancy in first trimester   Discharged Condition: good  Hospital Course: Patient admitted secondary to clinical signs of pyelonephritis in pregnancy with fever 102.3. Patient received empiric antibiotic treatment and remained afebrile by 8 hours. Patient was eager to be discharged home. She is scheduled for an Ob visit on 7/28 and will complete a 10-day course of antibiotic. She has been tolerating a regular diet without nausea or emesis.  Consults: None   Treatments: antibiotics: ceftriaxone  Discharge Exam: Blood pressure 106/71, pulse 72, temperature 98.4 F (36.9 C), temperature source Oral, resp. rate 18, height 5\' 6"  (1.676 m), weight 154 lb (69.854 kg), last menstrual period 01/28/2013, SpO2 100.00%, unknown if currently breastfeeding. General appearance: alert, cooperative and no distress Back: no CVA tenderness Resp: clear to auscultation bilaterally Cardio: regular rate and rhythm Extremities: extremities normal, atraumatic, no cyanosis or edema, Homans sign is negative, no sign of DVT and no edema, redness or tenderness in the calves or thighs  Disposition: 01-Home or Self Care     Medication List         cephALEXin 500 MG capsule  Commonly known as:  KEFLEX  Take 1 capsule (500 mg total) by mouth 4 (four) times daily.     oxyCODONE-acetaminophen 5-325 MG per tablet  Commonly known as:  PERCOCET/ROXICET  Take 1-2 tablets by mouth every 6 (six) hours as needed for moderate pain or severe pain.     prenatal multivitamin Tabs tablet  Take 1 tablet by mouth daily at 12 noon.     promethazine 25 MG tablet  Commonly known as:  PHENERGAN  Take 1/2 to 1 tablet every 6 hours prn nausea        Follow-up Information   [facility] (As scheduled)    Specialty:  Obstetrics and Gynecology   Contact information:   [address] [place] [address] [phone]      Signed: Catalina AntiguaCONSTANT,Brittanee Ghazarian 05/12/2014, 9:43 AM

## 2014-05-12 NOTE — Progress Notes (Signed)
Pt had d/c information and teaching complete  Out before papers signed

## 2014-05-12 NOTE — H&P (Signed)
HPI  Margaret Knight is a 28 y.o. [id number] at 7698w4d. She has had vomiting and back pain x 2-3 wks. She has urinary frequency,urgency and dysuria x 2 wks. Her urine has strong odor. She has decreased appetite, constat nausea and achey like the flu. No change in discharge, odor or itching. No bleeding or cramping. MAU visit 6/21- U/S 6 4/7 wks,, GC/CT were neg.  OB History    Grav  Para  Term  Preterm  Abortions  TAB  SAB  Ect  Mult  Living    8  4  4   0  3  1  2   0  0  4      Past Medical History   Diagnosis  Date   .  Migraines    .  History of recurrent UTI (urinary tract infection)    .  Normal pregnancy, repeat  03/09/2012   .  SVD (spontaneous vaginal delivery)  03/10/2012    Past Surgical History   Procedure  Laterality  Date   .  No past surgeries      Family History   Problem  Relation  Age of Onset   .  Anesthesia problems  Neg Hx    .  Cancer  Mother      breast   .  Hypertension  Mother    .  Diabetes  Maternal Grandmother    .  Diabetes  Paternal Grandmother     History   Substance Use Topics   .  Smoking status:  Never Smoker   .  Smokeless tobacco:  Never Used   .  Alcohol Use:  No   Allergies: No Known Allergies  No prescriptions prior to admission   BP 116/61  Pulse 123  Temp(Src) 102.7 F (39.3 C) (Oral)  Resp 18  Ht 5\' 6"  (1.676 m)  Wt 152 lb (68.947 kg)  BMI 24.55 kg/m2  SpO2 100%  LMP 01/28/2013  Review of Systems  Constitutional: Positive for fever, chills and malaise/fatigue.  Gastrointestinal: Positive for nausea. Negative for vomiting, abdominal pain and diarrhea.  Genitourinary: Positive for dysuria, urgency, frequency and flank pain.  Musculoskeletal: Positive for myalgias.  Skin: Negative for rash.  Neurological: Positive for weakness and headaches.  Physical Exam   Blood pressure 116/61, pulse 123, temperature 102.7 F (39.3 C), temperature source Oral, resp. rate 18, height 5\' 6"  (1.676 m), weight 152 lb (68.947 kg), last menstrual period  01/28/2013, SpO2 100.00%, unknown if currently breastfeeding.  Physical Exam  Nursing note and vitals reviewed.  Constitutional: She is oriented to person, place, and time. She appears well-developed and well-nourished.  Cardiovascular: Normal rate, regular rhythm and normal heart sounds.  Respiratory: Effort normal and breath sounds normal.  GI: Soft. There is no tenderness. There is CVA tenderness.  Musculoskeletal: Normal range of motion.  Neurological: She is alert and oriented to person, place, and time.  Skin: Skin is warm and dry.  Psychiatric: She has a normal mood and affect. Her behavior is normal.  MAU Course   Procedures  MDM  Results for orders placed during the hospital encounter of 05/10/14 (from the past 24 hour(s))   CBC WITH DIFFERENTIAL Status: Abnormal    Collection Time    05/10/14 1:25 PM   Result  Value  Ref Range    WBC  13.9 (*)  4.0 - 10.5 K/uL    RBC  3.74 (*)  3.87 - 5.11 MIL/uL    Hemoglobin  10.3 (*)  12.0 - 15.0 g/dL    HCT  11.9 (*)  14.7 - 46.0 %    MCV  82.4  78.0 - 100.0 fL    MCH  27.5  26.0 - 34.0 pg    MCHC  33.4  30.0 - 36.0 g/dL    RDW  82.9 (*)  56.2 - 15.5 %    Platelets  390  150 - 400 K/uL    Neutrophils Relative %  76  43 - 77 %    Neutro Abs  10.5 (*)  1.7 - 7.7 K/uL    Lymphocytes Relative  11 (*)  12 - 46 %    Lymphs Abs  1.5  0.7 - 4.0 K/uL    Monocytes Relative  13 (*)  3 - 12 %    Monocytes Absolute  1.9 (*)  0.1 - 1.0 K/uL    Eosinophils Relative  0  0 - 5 %    Eosinophils Absolute  0.0  0.0 - 0.7 K/uL    Basophils Relative  0  0 - 1 %    Basophils Absolute  0.0  0.0 - 0.1 K/uL   U/A pending  Assessment and Plan   Pyelonephritis 9 4/7 wks Admit for IV rocephin Send uc&s

## 2014-05-12 NOTE — Discharge Instructions (Signed)
Pyelonephritis, Adult °Pyelonephritis is a kidney infection. A kidney infection can happen quickly, or it can last for a long time. °HOME CARE  °· Take your medicine (antibiotics) as told. Finish it even if you start to feel better. °· Keep all doctor visits as told. °· Drink enough fluids to keep your pee (urine) clear or pale yellow. °· Only take medicine as told by your doctor. °GET HELP RIGHT AWAY IF:  °· You have a fever or lasting symptoms for more than 2-3 days. °· You have a fever and your symptoms suddenly get worse. °· You cannot take your medicine or drink fluids as told. °· You have chills and shaking. °· You feel very weak or pass out (faint). °· You do not feel better after 2 days. °MAKE SURE YOU: °· Understand these instructions. °· Will watch your condition. °· Will get help right away if you are not doing well or get worse. °Document Released: 11/23/2004 Document Revised: 04/16/2012 Document Reviewed: 04/05/2011 °Exit[employer]® Patient Information ©2015 [employer]. This information is not intended to replace advice given to you by your health care provider. Make sure you discuss any questions you have with your health care provider. ° °

## 2014-05-13 LAB — URINE CULTURE: Special Requests: NORMAL

## 2014-05-16 LAB — CULTURE, BLOOD (ROUTINE X 2)
Culture: NO GROWTH
Culture: NO GROWTH
Special Requests: 5

## 2014-05-18 ENCOUNTER — Encounter: Payer: Self-pay | Admitting: General Practice

## 2014-05-18 ENCOUNTER — Telehealth: Payer: Self-pay | Admitting: General Practice

## 2014-05-18 ENCOUNTER — Ambulatory Visit: Payer: Medicaid Other | Admitting: Obstetrics & Gynecology

## 2014-05-18 NOTE — Telephone Encounter (Signed)
Patient no showed for appt today. Called patient, no answer- left message we are calling because it appears you missed an appt with us today, please contact our front office staff to reschedule this appt. Will send letter

## 2014-07-26 ENCOUNTER — Encounter (HOSPITAL_COMMUNITY): Payer: Self-pay | Admitting: *Deleted

## 2014-07-26 ENCOUNTER — Inpatient Hospital Stay (HOSPITAL_COMMUNITY)
Admission: AD | Admit: 2014-07-26 | Discharge: 2014-07-26 | Disposition: A | Discharge status: Home / Self Care | Payer: Medicaid Other | Source: Ambulatory Visit | Attending: Obstetrics and Gynecology | Admitting: Obstetrics and Gynecology

## 2014-07-26 DIAGNOSIS — T192XXA Foreign body in vulva and vagina, initial encounter: Secondary | ICD-10-CM

## 2014-07-26 DIAGNOSIS — N72 Inflammatory disease of cervix uteri: Secondary | ICD-10-CM | POA: Insufficient documentation

## 2014-07-26 DIAGNOSIS — N898 Other specified noninflammatory disorders of vagina: Secondary | ICD-10-CM | POA: Diagnosis present

## 2014-07-26 LAB — CBC WITH DIFFERENTIAL/PLATELET
Basophils Absolute: 0 10*3/uL (ref 0.0–0.1)
Basophils Relative: 0 % (ref 0–1)
EOS ABS: 0.1 10*3/uL (ref 0.0–0.7)
Eosinophils Relative: 1 % (ref 0–5)
HCT: 33.8 % — ABNORMAL LOW (ref 36.0–46.0)
Hemoglobin: 10.7 g/dL — ABNORMAL LOW (ref 12.0–15.0)
LYMPHS ABS: 2.4 10*3/uL (ref 0.7–4.0)
Lymphocytes Relative: 42 % (ref 12–46)
MCH: 27.4 pg (ref 26.0–34.0)
MCHC: 31.7 g/dL (ref 30.0–36.0)
MCV: 86.4 fL (ref 78.0–100.0)
Monocytes Absolute: 0.5 10*3/uL (ref 0.1–1.0)
Monocytes Relative: 8 % (ref 3–12)
NEUTROS PCT: 49 % (ref 43–77)
Neutro Abs: 2.8 10*3/uL (ref 1.7–7.7)
PLATELETS: 457 10*3/uL — AB (ref 150–400)
RBC: 3.91 MIL/uL (ref 3.87–5.11)
RDW: 14.4 % (ref 11.5–15.5)
WBC: 5.8 10*3/uL (ref 4.0–10.5)

## 2014-07-26 LAB — URINALYSIS, ROUTINE W REFLEX MICROSCOPIC
Bilirubin Urine: NEGATIVE
GLUCOSE, UA: NEGATIVE mg/dL
Ketones, ur: NEGATIVE mg/dL
LEUKOCYTES UA: NEGATIVE
NITRITE: POSITIVE — AB
PROTEIN: NEGATIVE mg/dL
Specific Gravity, Urine: 1.02 (ref 1.005–1.030)
UROBILINOGEN UA: 0.2 mg/dL (ref 0.0–1.0)
pH: 6 (ref 5.0–8.0)

## 2014-07-26 LAB — URINE MICROSCOPIC-ADD ON

## 2014-07-26 LAB — WET PREP, GENITAL
Trich, Wet Prep: NONE SEEN
YEAST WET PREP: NONE SEEN

## 2014-07-26 LAB — HIV ANTIBODY (ROUTINE TESTING W REFLEX): HIV: NONREACTIVE

## 2014-07-26 LAB — RPR

## 2014-07-26 LAB — POCT PREGNANCY, URINE: Preg Test, Ur: NEGATIVE

## 2014-07-26 MED ORDER — FLUCONAZOLE 150 MG PO TABS: 150.0000 mg | ORAL_TABLET | Freq: Every day | ORAL | Status: DC

## 2014-07-26 MED ORDER — DOXYCYCLINE HYCLATE 100 MG PO CAPS: 100.0000 mg | ORAL_CAPSULE | Freq: Two times a day (BID) | ORAL | Status: DC

## 2014-07-26 NOTE — Discharge Instructions (Signed)
Your cervix is inflamed due to the tampon that has been in for an extended period of time. Take the antibiotics as directed. Return for worsening symptoms such as high fever, severe pain or heavy bleeding.   Cervicitis Cervicitis is a soreness and swelling (inflammation) of the cervix. Your cervix is located at the bottom of your uterus. It opens up to the vagina. CAUSES   Sexually transmitted infections (STIs).   Allergic reaction.   Medicines or birth control devices that are put in the vagina.   Injury to the cervix.   Bacterial infections.  RISK FACTORS You are at greater risk if you:  Have unprotected sexual intercourse.  Have sexual intercourse with many partners.  Began sexual intercourse at an early age.  Have a history of STIs. SYMPTOMS  There may be no symptoms. If symptoms occur, they may include:   Gray, white, yellow, or bad-smelling vaginal discharge.   Pain or itching of the area outside the vagina.   Painful sexual intercourse.   Lower abdominal or lower back pain, especially during intercourse.   Frequent urination.   Abnormal vaginal bleeding between periods, after sexual intercourse, or after menopause.   Pressure or a heavy feeling in the pelvis.  DIAGNOSIS  Diagnosis is made after a pelvic exam. Other tests may include:   Examination of any discharge under a microscope (wet prep).   A Pap test.  TREATMENT  Treatment will depend on the cause of cervicitis. If it is caused by an STI, both you and your partner will need to be treated. Antibiotic medicines will be given.  HOME CARE INSTRUCTIONS   Do not have sexual intercourse until your health care provider says it is okay.   Do not have sexual intercourse until your partner has been treated, if your cervicitis is caused by an STI.   Take your antibiotics as directed. Finish them even if you start to feel better.  SEEK MEDICAL CARE IF:  Your symptoms come back.   You have  a fever.  MAKE SURE YOU:   Understand these instructions.  Will watch your condition.  Will get help right away if you are not doing well or get worse. Document Released: 10/16/2005 Document Revised: 10/21/2013 Document Reviewed: 04/09/2013 [facility] Patient Information 2015 [place], [place]. This information is not intended to replace advice given to you by your health care provider. Make sure you discuss any questions you have with your health care provider.

## 2014-07-26 NOTE — MAU Provider Note (Signed)
History     CSN: [id number]  Arrival date and time: 07/26/14 1544   None     Chief Complaint  Patient presents with  . Vaginal Discharge   Patient is a 28 y.o. female presenting with vaginal discharge.  Vaginal Discharge This is a new problem. The current episode started 1 to 4 weeks ago. The problem occurs constantly. The problem has been gradually worsening. The symptoms are aggravated by intercourse. She has tried nothing for the symptoms.     Margaret Knight is a 28 y.o. [phone] who presents to MAU with vaginal discharge and abdominal cramping. She states that she had a EAB the end of August @ 20+ weeks gestation. She did not go for her follow up visit. When she went home after the procedure she had someone else's medication and it was the morning after pill rather than antibiotics. She has been having vaginal discharge with a bad odor since that time. She also complains of abdominal cramping. She had only mild bleeding after the procedure and then had an extremely heavy period in September. She is concerned about the bad smell and discharge she is having. She request testing for STI's. She also states she used tampons while she was having her last period a few weeks ago and afraid she may have left one in.   Past Medical History  Diagnosis Date  . Migraines   . History of recurrent UTI (urinary tract infection)   . Normal pregnancy, repeat 03/09/2012  . SVD (spontaneous vaginal delivery) 03/10/2012    Past Surgical History  Procedure Laterality Date  . Dilation and curettage of uterus      Family History  Problem Relation Age of Onset  . Anesthesia problems Neg Hx   . Cancer Mother     breast  . Hypertension Mother   . Diabetes Maternal Grandmother   . Diabetes Paternal Grandmother     History  Substance Use Topics  . Smoking status: Never Smoker   . Smokeless tobacco: Never Used  . Alcohol Use: No    Allergies: No Known Allergies  No prescriptions prior to  admission    Review of Systems  Genitourinary: Positive for vaginal discharge.       Vagina odor  All other systems negative  Physical Exam   Blood pressure 125/79, pulse 62, temperature 98.5 F (36.9 C), temperature source Oral, resp. rate 16, height  (1.702 m), weight 141 lb (63.957 kg), last menstrual period 07/09/2014, unknown if currently breastfeeding.  Physical Exam  Constitutional: She is oriented to person, place, and time. She appears well-developed and well-nourished. No distress.  HENT:  Head: Normocephalic and atraumatic.  Eyes: EOM are normal.  Neck: Neck supple.  Cardiovascular: Normal rate.   Respiratory: Effort normal.  GI: Soft. There is no tenderness.  Genitourinary:  External genitalia without lesions, foreign body vaginal vault. Using ring forceps tampon removed. Cervix friable, yellow discharge, no CMT, no adnexal tenderness. Uterus without palpable enlargement.   Musculoskeletal: Normal range of motion.  Neurological: She is alert and oriented to person, place, and time.  Skin: Skin is warm and dry.  Psychiatric: She has a normal mood and affect. Her behavior is normal. Judgment and thought content normal.    MAU Course  Procedures Results for orders placed during the hospital encounter of 07/26/14 (from the past 24 hour(s))  URINALYSIS, ROUTINE W REFLEX MICROSCOPIC     Status: Abnormal   Collection Time    07/26/14  3:49  PM      Result Value Ref Range   Color, Urine YELLOW  YELLOW   APPearance HAZY (*) CLEAR   Specific Gravity, Urine 1.020  1.005 - 1.030   pH 6.0  5.0 - 8.0   Glucose, UA NEGATIVE  NEGATIVE mg/dL   Hgb urine dipstick MODERATE (*) NEGATIVE   Bilirubin Urine NEGATIVE  NEGATIVE   Ketones, ur NEGATIVE  NEGATIVE mg/dL   Protein, ur NEGATIVE  NEGATIVE mg/dL   Urobilinogen, UA 0.2  0.0 - 1.0 mg/dL   Nitrite POSITIVE (*) NEGATIVE   Leukocytes, UA NEGATIVE  NEGATIVE  URINE MICROSCOPIC-ADD ON     Status: Abnormal   Collection Time     07/26/14  3:49 PM      Result Value Ref Range   Squamous Epithelial / LPF FEW (*) RARE   WBC, UA 0-2  <3 WBC/hpf   RBC / HPF 0-2  <3 RBC/hpf   Bacteria, UA MANY (*) RARE   Urine-Other MUCOUS PRESENT    POCT PREGNANCY, URINE     Status: None   Collection Time    07/26/14  4:17 PM      Result Value Ref Range   Preg Test, Ur NEGATIVE  NEGATIVE  WET PREP, GENITAL     Status: Abnormal   Collection Time    07/26/14  4:36 PM      Result Value Ref Range   Yeast Wet Prep HPF POC NONE SEEN  NONE SEEN   Trich, Wet Prep NONE SEEN  NONE SEEN   Clue Cells Wet Prep HPF POC RARE (*) NONE SEEN   WBC, Wet Prep HPF POC MODERATE (*) NONE SEEN  CBC WITH DIFFERENTIAL     Status: Abnormal   Collection Time    07/26/14  4:50 PM      Result Value Ref Range   WBC 5.8  4.0 - 10.5 K/uL   RBC 3.91  3.87 - 5.11 MIL/uL   Hemoglobin 10.7 (*) 12.0 - 15.0 g/dL   HCT 16.1 (*) 09.6 - 04.5 %   MCV 86.4  78.0 - 100.0 fL   MCH 27.4  26.0 - 34.0 pg   MCHC 31.7  30.0 - 36.0 g/dL   RDW 40.9  81.1 - 91.4 %   Platelets 457 (*) 150 - 400 K/uL   Neutrophils Relative % 49  43 - 77 %   Neutro Abs 2.8  1.7 - 7.7 K/uL   Lymphocytes Relative 42  12 - 46 %   Lymphs Abs 2.4  0.7 - 4.0 K/uL   Monocytes Relative 8  3 - 12 %   Monocytes Absolute 0.5  0.1 - 1.0 K/uL   Eosinophils Relative 1  0 - 5 %   Eosinophils Absolute 0.1  0.0 - 0.7 K/uL   Basophils Relative 0  0 - 1 %   Basophils Absolute 0.0  0.0 - 0.1 K/uL     Assessment and Plan  28 y.o. female s/p EAB end of August with vaginal discharge and odor. Tampon removed from vaginal vault. Will treat for cervicitis. Patient stable for discharge without signs of toxic shock or PID. Discussed with the patient clinical and lab findings and plan of care and all questioned fully answered. She will return if any problems arise.    Medication List         doxycycline 100 MG capsule  Commonly known as:  VIBRAMYCIN  Take 1 capsule (100 mg total) by mouth 2 (two) times  daily.     fluconazole 150 MG tablet  Commonly known as:  DIFLUCAN  Take 1 tablet (150 mg total) by mouth daily.         Margaret Knight 07/26/2014, 4:12 PM

## 2014-07-26 NOTE — MAU Note (Signed)
Patient presents with complaint of vaginal discharge X 1 month following d&c.

## 2014-07-27 LAB — GC/CHLAMYDIA PROBE AMP
CT Probe RNA: POSITIVE — AB
GC Probe RNA: NEGATIVE

## 2014-07-27 NOTE — MAU Provider Note (Signed)
Attestation of Attending Supervision of Advanced Practitioner: Evaluation and management procedures were performed by the PA/NP/CNM/OB Fellow under my supervision/collaboration. Chart reviewed and agree with management and plan.  Tilda Burrow 07/27/2014 6:23 PM  Attestation of Attending Supervision of Advanced Practitioner: Evaluation and management procedures were performed by the PA/NP/CNM/OB Fellow under my supervision/collaboration. Chart reviewed and agree with management and plan.  Tilda Burrow 07/27/2014 6:23 PM

## 2014-07-28 ENCOUNTER — Telehealth (HOSPITAL_COMMUNITY): Payer: Self-pay | Admitting: Nurse Practitioner

## 2014-07-28 NOTE — Telephone Encounter (Signed)
Patient returned call, notified her of positive chlamydia culture.  Instructed patient to notify her partner for treatment.

## 2014-07-28 NOTE — Telephone Encounter (Signed)
Telephone call to patient regarding positive chlamydia culture, patient not in, left message for her to call.  Patient was given Rx for Doxycycline at time of visit.  Report of treatment faxed to health department.

## 2014-08-25 ENCOUNTER — Encounter: Payer: Self-pay | Admitting: General Practice

## 2014-08-31 ENCOUNTER — Encounter (HOSPITAL_COMMUNITY): Payer: Self-pay | Admitting: *Deleted

## 2014-09-05 ENCOUNTER — Encounter (HOSPITAL_COMMUNITY): Payer: Self-pay | Admitting: Obstetrics and Gynecology

## 2014-09-05 ENCOUNTER — Inpatient Hospital Stay (HOSPITAL_COMMUNITY)
Admission: AD | Admit: 2014-09-05 | Discharge: 2014-09-05 | Disposition: A | Discharge status: Home / Self Care | Payer: Medicaid Other | Source: Ambulatory Visit | Attending: Obstetrics & Gynecology | Admitting: Obstetrics & Gynecology

## 2014-09-05 DIAGNOSIS — N939 Abnormal uterine and vaginal bleeding, unspecified: Secondary | ICD-10-CM | POA: Diagnosis not present

## 2014-09-05 DIAGNOSIS — N898 Other specified noninflammatory disorders of vagina: Secondary | ICD-10-CM | POA: Insufficient documentation

## 2014-09-05 HISTORY — DX: Chlamydial infection, unspecified: A74.9

## 2014-09-05 LAB — URINE MICROSCOPIC-ADD ON

## 2014-09-05 LAB — URINALYSIS, ROUTINE W REFLEX MICROSCOPIC
BILIRUBIN URINE: NEGATIVE
Glucose, UA: NEGATIVE mg/dL
Ketones, ur: NEGATIVE mg/dL
Leukocytes, UA: NEGATIVE
Nitrite: NEGATIVE
PH: 7 (ref 5.0–8.0)
Protein, ur: NEGATIVE mg/dL
Specific Gravity, Urine: 1.025 (ref 1.005–1.030)
Urobilinogen, UA: 1 mg/dL (ref 0.0–1.0)

## 2014-09-05 LAB — POCT PREGNANCY, URINE: Preg Test, Ur: NEGATIVE

## 2014-09-05 LAB — WET PREP, GENITAL
TRICH WET PREP: NONE SEEN
Yeast Wet Prep HPF POC: NONE SEEN

## 2014-09-05 MED ORDER — MEDROXYPROGESTERONE ACETATE 150 MG/ML IM SUSP: 150.0000 mg | Freq: Once | INTRAMUSCULAR | Status: DC

## 2014-09-05 MED ORDER — NORGESTIMATE-ETH ESTRADIOL 0.25-35 MG-MCG PO TABS: 1.0000 | ORAL_TABLET | Freq: Every day | ORAL | Status: DC

## 2014-09-05 NOTE — MAU Note (Signed)
Last time I was here i had a tampon stuck and didn't know it. Had a lot of smelly vag d/c and having the same thing again. Having symptoms of pregnancy, pain with sex and bleeding afterward. Treated for Chlamydia in the past.

## 2014-09-05 NOTE — MAU Provider Note (Signed)
History     CSN: [id number]  Arrival date and time: 09/05/14 [id number]   First Provider Initiated Contact with Patient 09/05/14 0150      Chief Complaint  Patient presents with  . Abdominal Pain  . Vaginal Bleeding   HPI   [name] is a 28 y.o. female [phone]  who presents with abnormal vaginal bleeding, bleeding following intercourse and abdominal cramping. For the past month the patient has had spotting after intercourse. No new sexual partners, however the partner she is with is a Software engineercheater. She was treated for chlamydia in September and is not sure if her partner was treated.   OB History    Gravida Para Term Preterm AB TAB SAB Ectopic Multiple Living   8 4 4  0 4 2 2  0 0 4      Past Medical History  Diagnosis Date  . Migraines   . History of recurrent UTI (urinary tract infection)   . Normal pregnancy, repeat 03/09/2012  . SVD (spontaneous vaginal delivery) 03/10/2012  . Chlamydia     Past Surgical History  Procedure Laterality Date  . Dilation and curettage of uterus      Family History  Problem Relation Age of Onset  . Anesthesia problems Neg Hx   . Cancer Mother     breast  . Hypertension Mother   . Diabetes Maternal Grandmother   . Diabetes Paternal Grandmother     History  Substance Use Topics  . Smoking status: Never Smoker   . Smokeless tobacco: Never Used  . Alcohol Use: No    Allergies: No Known Allergies  Prescriptions prior to admission  Medication Sig Dispense Refill Last Dose  . doxycycline (VIBRAMYCIN) 100 MG capsule Take 1 capsule (100 mg total) by mouth 2 (two) times daily. 20 capsule 0   . fluconazole (DIFLUCAN) 150 MG tablet Take 1 tablet (150 mg total) by mouth daily. 1 tablet 0    Results for orders placed or performed during the hospital encounter of 09/05/14 (from the past 48 hour(s))  Urinalysis, Routine w reflex microscopic     Status: Abnormal   Collection Time: 09/05/14  1:32 AM  Result Value Ref Range   Color, Urine  YELLOW YELLOW   APPearance CLEAR CLEAR   Specific Gravity, Urine 1.025 1.005 - 1.030   pH 7.0 5.0 - 8.0   Glucose, UA NEGATIVE NEGATIVE mg/dL   Hgb urine dipstick MODERATE (A) NEGATIVE   Bilirubin Urine NEGATIVE NEGATIVE   Ketones, ur NEGATIVE NEGATIVE mg/dL   Protein, ur NEGATIVE NEGATIVE mg/dL   Urobilinogen, UA 1.0 0.0 - 1.0 mg/dL   Nitrite NEGATIVE NEGATIVE   Leukocytes, UA NEGATIVE NEGATIVE  Urine microscopic-add on     Status: None   Collection Time: 09/05/14  1:32 AM  Result Value Ref Range   Squamous Epithelial / LPF RARE RARE   WBC, UA 0-2 <3 WBC/hpf   RBC / HPF 0-2 <3 RBC/hpf   Bacteria, UA RARE RARE   Urine-Other MUCOUS PRESENT   Pregnancy, urine POC     Status: None   Collection Time: 09/05/14  1:38 AM  Result Value Ref Range   Preg Test, Ur NEGATIVE NEGATIVE    Comment:        THE SENSITIVITY OF THIS METHODOLOGY IS >24 mIU/mL   Wet prep, genital     Status: Abnormal   Collection Time: 09/05/14  2:21 AM  Result Value Ref Range   Yeast Wet Prep HPF POC NONE SEEN  NONE SEEN   Trich, Wet Prep NONE SEEN NONE SEEN   Clue Cells Wet Prep HPF POC FEW (A) NONE SEEN   WBC, Wet Prep HPF POC FEW (A) NONE SEEN    Comment: FEW BACTERIA SEEN    Review of Systems  Constitutional: Negative for fever and chills.  Gastrointestinal: Negative for nausea, vomiting and abdominal pain.  Genitourinary: Negative for dysuria, urgency and frequency.       +vaginal discharge with an odor    Physical Exam   Blood pressure 116/67, pulse 59, temperature 98.8 F (37.1 C), resp. rate 18, height 5\' 7"  (1.702 m), weight 64.501 kg (142 lb 3.2 oz), last menstrual period 08/06/2014, not currently breastfeeding.  Physical Exam  Constitutional: She is oriented to person, place, and time. She appears well-developed and well-nourished. No distress.  HENT:  Head: Normocephalic.  Neck: Neck supple.  Respiratory: Effort normal.  GI: Soft. She exhibits no distension. There is no tenderness. There  is no rebound.  Genitourinary:  Speculum exam: Vagina - Small amount of dark red blood in vaginal canal  Cervix - + active bleeding, small amount  Bimanual exam: Cervix closed, no CMT  Uterus non tender, normal size Adnexa non tender, no masses bilaterally GC/Chlam, wet prep done Chaperone present for exam.   Musculoskeletal: Normal range of motion.  Neurological: She is alert and oriented to person, place, and time.  Skin: Skin is warm. She is not diaphoretic.  Psychiatric: Her behavior is normal.    MAU Course  Procedures  None  MDM Birth control pills requested by patient> non smoker> no history of DVT.  Wet prep GC- pending Patient declined HIV     Assessment and Plan   A: Abnormal vaginal bleeding Abnormal vaginal discharge  P: Discharge home in stable condition [facility]: Sprintec DVT precautions Condoms always   Iona HansenJennifer Irene Rasch, NP 09/05/2014 3:08 AM

## 2014-09-07 LAB — GC/CHLAMYDIA PROBE AMP
CT PROBE, AMP APTIMA: NEGATIVE
GC PROBE AMP APTIMA: NEGATIVE

## 2014-11-23 ENCOUNTER — Inpatient Hospital Stay (HOSPITAL_COMMUNITY)
Admission: AD | Admit: 2014-11-23 | Discharge: 2014-11-23 | Disposition: A | Discharge status: Home / Self Care | Payer: Medicaid Other | Source: Ambulatory Visit | Attending: Obstetrics & Gynecology | Admitting: Obstetrics & Gynecology

## 2014-11-23 ENCOUNTER — Encounter (HOSPITAL_COMMUNITY): Payer: Self-pay | Admitting: *Deleted

## 2014-11-23 DIAGNOSIS — R51 Headache: Secondary | ICD-10-CM | POA: Diagnosis not present

## 2014-11-23 DIAGNOSIS — N760 Acute vaginitis: Secondary | ICD-10-CM | POA: Diagnosis not present

## 2014-11-23 DIAGNOSIS — B9689 Other specified bacterial agents as the cause of diseases classified elsewhere: Secondary | ICD-10-CM | POA: Diagnosis not present

## 2014-11-23 DIAGNOSIS — A499 Bacterial infection, unspecified: Secondary | ICD-10-CM

## 2014-11-23 DIAGNOSIS — R319 Hematuria, unspecified: Secondary | ICD-10-CM | POA: Diagnosis present

## 2014-11-23 LAB — URINALYSIS, ROUTINE W REFLEX MICROSCOPIC
Bilirubin Urine: NEGATIVE
Glucose, UA: NEGATIVE mg/dL
Hgb urine dipstick: NEGATIVE
Ketones, ur: NEGATIVE mg/dL
NITRITE: NEGATIVE
Protein, ur: NEGATIVE mg/dL
Specific Gravity, Urine: >1.03 — ABNORMAL HIGH (ref 1.005–1.030)
Urobilinogen, UA: 0.2 mg/dL (ref 0.0–1.0)
pH: 6 (ref 5.0–8.0)

## 2014-11-23 LAB — WET PREP, GENITAL
Trich, Wet Prep: NONE SEEN
Yeast Wet Prep HPF POC: NONE SEEN

## 2014-11-23 LAB — URINE MICROSCOPIC-ADD ON

## 2014-11-23 LAB — POCT PREGNANCY, URINE: Preg Test, Ur: NEGATIVE

## 2014-11-23 MED ORDER — METRONIDAZOLE 500 MG PO TABS: 500.0000 mg | ORAL_TABLET | Freq: Two times a day (BID) | ORAL | Status: DC

## 2014-11-23 NOTE — MAU Provider Note (Signed)
History     CSN: [id number]  Arrival date and time: 11/23/14 2031   First Provider Initiated Contact with Patient 11/23/14 2107     Chief Complaint  Patient presents with  . Headache  . Hematuria   HPI  Margaret Knight is a 29 y.o. female who presents for hematuria. Symptoms started 2 weeks ago. Reports blood in urine, urinary urgency, increased frequency; denies pain with urination. Feels like unable to empty bladder. Reports fever 1 week ago because she felt like she was feverish but did not check temp; denies chills. Hx of UTI & kidney infections. Feels like underwear stays moist; unsure if it's vaginal discharge or urine. Fishy odor with urine x 1.5 weeks. New partner x 1 month. Does not use condoms every time. Desires STI testing.   Daily headaches started 2 weeks ago; last headache was 1 week ago. Hx of migraines, states this does not feel like migraines she's had. States it's a "slight" headache in the morning and thinks it is due to her "children getting on her nerves".  No pain at this time.     OB History    Gravida Para Term Preterm AB TAB SAB Ectopic Multiple Living   0 0 0 4      Past Medical History  Diagnosis Date  . Migraines   . History of recurrent UTI (urinary tract infection)   . Normal pregnancy, repeat 03/09/2012  . SVD (spontaneous vaginal delivery) 03/10/2012  . Chlamydia     Past Surgical History  Procedure Laterality Date  . Dilation and curettage of uterus      Family History  Problem Relation Age of Onset  . Anesthesia problems Neg Hx   . Cancer Mother     breast  . Hypertension Mother   . Diabetes Maternal Grandmother   . Diabetes Paternal Grandmother     History  Substance Use Topics  . Smoking status: Never Smoker   . Smokeless tobacco: Never Used  . Alcohol Use: No    Allergies: No Known Allergies  Prescriptions prior to admission  Medication Sig Dispense Refill Last Dose  . doxycycline (VIBRAMYCIN) 100 MG  capsule Take 1 capsule (100 mg total) by mouth 2 (two) times daily. 20 capsule 0   . fluconazole (DIFLUCAN) 150 MG tablet Take 1 tablet (150 mg total) by mouth daily. 1 tablet 0   . norgestimate-ethinyl estradiol (ORTHO-CYCLEN,SPRINTEC,PREVIFEM) 0.25-35 MG-MCG tablet Take 1 tablet by mouth daily. 1 Package 5     Review of Systems  Constitutional: Positive for fever (states fever x 1 week ago, didn't check temp). Negative for chills.  Cardiovascular: Negative.   Gastrointestinal: Negative.   Genitourinary: Positive for dysuria, urgency, frequency and hematuria. Negative for flank pain.  Neurological: Positive for headaches.   Physical Exam   Blood pressure 112/75, pulse 69, temperature 98.4 F (36.9 C), temperature source Oral, resp. rate 16, height  (1.702 m), weight 145 lb (65.772 kg), last menstrual period 11/08/2014, SpO2 100 %, not currently breastfeeding.  Physical Exam  Constitutional: She is oriented to person, place, and time. She appears well-developed and well-nourished. No distress.  HENT:  Head: Normocephalic and atraumatic.  Cardiovascular: Normal rate, regular rhythm and normal heart sounds.   Respiratory: Effort normal and breath sounds normal. No respiratory distress. She has no wheezes.  GI: Soft. She exhibits no distension. There is no tenderness. There is no CVA tenderness.  Genitourinary: Vagina normal and uterus normal. Cervix  exhibits discharge (small amount of thin white discharge). Cervix exhibits no motion tenderness and no friability. Right adnexum displays no mass, no tenderness and no fullness. Left adnexum displays no mass, no tenderness and no fullness.  Neurological: She is alert and oriented to person, place, and time.  Skin: Skin is warm and dry. She is not diaphoretic.  Psychiatric: She has a normal mood and affect. Her behavior is normal.   Results for orders placed or performed during the hospital encounter of 11/23/14 (from the past 24 hour(s))   Urinalysis, Routine w reflex microscopic     Status: Abnormal   Collection Time: 11/23/14  8:40 PM  Result Value Ref Range   Color, Urine YELLOW YELLOW   APPearance CLEAR CLEAR   Specific Gravity, Urine >1.030 (H) 1.005 - 1.030   pH 6.0 5.0 - 8.0   Glucose, UA NEGATIVE NEGATIVE mg/dL   Hgb urine dipstick NEGATIVE NEGATIVE   Bilirubin Urine NEGATIVE NEGATIVE   Ketones, ur NEGATIVE NEGATIVE mg/dL   Protein, ur NEGATIVE NEGATIVE mg/dL   Urobilinogen, UA 0.2 0.0 - 1.0 mg/dL   Nitrite NEGATIVE NEGATIVE   Leukocytes, UA TRACE (A) NEGATIVE  Urine microscopic-add on     Status: Abnormal   Collection Time: 11/23/14  8:40 PM  Result Value Ref Range   Squamous Epithelial / LPF FEW (A) RARE   WBC, UA 0-2 <3 WBC/hpf   Bacteria, UA FEW (A) RARE   Urine-Other MUCOUS PRESENT   Pregnancy, urine POC     Status: None   Collection Time: 11/23/14  8:44 PM  Result Value Ref Range   Preg Test, Ur NEGATIVE NEGATIVE  Wet prep, genital     Status: Abnormal   Collection Time: 11/23/14  9:32 PM  Result Value Ref Range   Yeast Wet Prep HPF POC NONE SEEN NONE SEEN   Trich, Wet Prep NONE SEEN NONE SEEN   Clue Cells Wet Prep HPF POC FEW (A) NONE SEEN   WBC, Wet Prep HPF POC FEW (A) NONE SEEN     MAU Course  Procedures  MDM Declines blood work.   Assessment and Plan  Assessment: 1. BV (bacterial vaginosis)    Plan: Discharge home GC/chlamydia pending Rx for flagyl Urine culture pending Patient may return to MAU as needed or if her condition were to change or worsen   Claudie Reveringrin B Lawrence, Student-NP  11/23/2014, 9:08 PM   Patient counseled about HIV and RPR testing and declined at this time  I have seen and evaluated the patient with the NP/PA/Med student. I agree with the assessment and plan as written above.   Marny LowensteinJulie N Dayleen Beske, PA-C  11/23/2014 10:21 PM

## 2014-11-23 NOTE — Discharge Instructions (Signed)
Bacterial Vaginosis Bacterial vaginosis is a vaginal infection that occurs when the normal balance of bacteria in the vagina is disrupted. It results from an overgrowth of certain bacteria. This is the most common vaginal infection in women of childbearing age. Treatment is important to prevent complications, especially in pregnant women, as it can cause a premature delivery. CAUSES  Bacterial vaginosis is caused by an increase in harmful bacteria that are normally present in smaller amounts in the vagina. Several different kinds of bacteria can cause bacterial vaginosis. However, the reason that the condition develops is not fully understood. RISK FACTORS Certain activities or behaviors can put you at an increased risk of developing bacterial vaginosis, including:  Having a new sex partner or multiple sex partners.  Douching.  Using an intrauterine device (IUD) for contraception. Women do not get bacterial vaginosis from toilet seats, bedding, swimming pools, or contact with objects around them. SIGNS AND SYMPTOMS  Some women with bacterial vaginosis have no signs or symptoms. Common symptoms include:  Grey vaginal discharge.  A fishlike odor with discharge, especially after sexual intercourse.  Itching or burning of the vagina and vulva.  Burning or pain with urination. DIAGNOSIS  Your health care provider will take a medical history and examine the vagina for signs of bacterial vaginosis. A sample of vaginal fluid may be taken. Your health care provider will look at this sample under a microscope to check for bacteria and abnormal cells. A vaginal pH test may also be done.  TREATMENT  Bacterial vaginosis may be treated with antibiotic medicines. These may be given in the form of a pill or a vaginal cream. A second round of antibiotics may be prescribed if the condition comes back after treatment.  HOME CARE INSTRUCTIONS   Only take over-the-counter or prescription medicines as  directed by your health care provider.  If antibiotic medicine was prescribed, take it as directed. Make sure you finish it even if you start to feel better.  Do not have sex until treatment is completed.  Tell all sexual partners that you have a vaginal infection. They should see their health care provider and be treated if they have problems, such as a mild rash or itching.  Practice safe sex by using condoms and only having one sex partner. SEEK MEDICAL CARE IF:   Your symptoms are not improving after 3 days of treatment.  You have increased discharge or pain.  You have a fever. MAKE SURE YOU:   Understand these instructions.  Will watch your condition.  Will get help right away if you are not doing well or get worse. FOR MORE INFORMATION  Centers for Disease Control and Prevention, Division of STD Prevention: www.cdc.gov/std American Sexual Health Association (ASHA): www.ashastd.org  Document Released: 10/16/2005 Document Revised: 08/06/2013 Document Reviewed: 05/28/2013 Exit[employer] Patient Information 2015 [employer]. This information is not intended to replace advice given to you by your health care provider. Make sure you discuss any questions you have with your health care provider.  

## 2014-11-23 NOTE — MAU Note (Addendum)
Pt states she has been having headaches for the last 2 weeks but does not have one now.  Pt states the urinary symptoms with blood in urine and problems holding her urine.  States she is having blood tinged fluid.  Increased water and cranberry juice but symptoms are getting worse.  Pt states her urine has a very old, strong and fishy smell it.  Pt has a new sexual partner now for over a month.  Pt did say she had unprotective intercourse 1 week ago.

## 2014-11-24 LAB — GC/CHLAMYDIA PROBE AMP (~~LOC~~) NOT AT ARMC
Chlamydia: NEGATIVE
Neisseria Gonorrhea: NEGATIVE

## 2014-11-25 ENCOUNTER — Telehealth (HOSPITAL_COMMUNITY): Payer: Self-pay | Admitting: Obstetrics and Gynecology

## 2014-11-25 LAB — URINE CULTURE

## 2015-01-29 ENCOUNTER — Inpatient Hospital Stay (HOSPITAL_COMMUNITY)
Admission: AD | Admit: 2015-01-29 | Discharge: 2015-01-29 | Disposition: A | Discharge status: Home / Self Care | Payer: Medicaid Other | Attending: Obstetrics & Gynecology | Admitting: Obstetrics & Gynecology

## 2015-01-29 NOTE — Progress Notes (Signed)
Informed by admission clerk at front desk that patient left stating she will come back later.

## 2015-02-01 ENCOUNTER — Inpatient Hospital Stay (HOSPITAL_COMMUNITY): Payer: Medicaid Other

## 2015-02-01 ENCOUNTER — Encounter (HOSPITAL_COMMUNITY): Payer: Self-pay | Admitting: *Deleted

## 2015-02-01 ENCOUNTER — Inpatient Hospital Stay (HOSPITAL_COMMUNITY)
Admission: AD | Admit: 2015-02-01 | Discharge: 2015-02-01 | Disposition: A | Discharge status: Home / Self Care | Payer: Medicaid Other | Source: Ambulatory Visit | Attending: Obstetrics and Gynecology | Admitting: Obstetrics and Gynecology

## 2015-02-01 DIAGNOSIS — Z331 Pregnant state, incidental: Secondary | ICD-10-CM

## 2015-02-01 DIAGNOSIS — O3680X0 Pregnancy with inconclusive fetal viability, not applicable or unspecified: Secondary | ICD-10-CM

## 2015-02-01 DIAGNOSIS — O209 Hemorrhage in early pregnancy, unspecified: Secondary | ICD-10-CM | POA: Diagnosis present

## 2015-02-01 DIAGNOSIS — Z3A00 Weeks of gestation of pregnancy not specified: Secondary | ICD-10-CM | POA: Insufficient documentation

## 2015-02-01 LAB — CBC
HEMATOCRIT: 29 % — AB (ref 36.0–46.0)
HEMOGLOBIN: 8.9 g/dL — AB (ref 12.0–15.0)
MCH: 25.4 pg — AB (ref 26.0–34.0)
MCHC: 30.7 g/dL (ref 30.0–36.0)
MCV: 82.9 fL (ref 78.0–100.0)
Platelets: 413 10*3/uL — ABNORMAL HIGH (ref 150–400)
RBC: 3.5 MIL/uL — AB (ref 3.87–5.11)
RDW: 15.3 % (ref 11.5–15.5)
WBC: 4.4 10*3/uL (ref 4.0–10.5)

## 2015-02-01 LAB — URINALYSIS, ROUTINE W REFLEX MICROSCOPIC
GLUCOSE, UA: NEGATIVE mg/dL
Ketones, ur: NEGATIVE mg/dL
Leukocytes, UA: NEGATIVE
NITRITE: POSITIVE — AB
PH: 6 (ref 5.0–8.0)
Protein, ur: 100 mg/dL — AB
Specific Gravity, Urine: >1.03 — ABNORMAL HIGH (ref 1.005–1.030)
UROBILINOGEN UA: 1 mg/dL (ref 0.0–1.0)

## 2015-02-01 LAB — WET PREP, GENITAL
Trich, Wet Prep: NONE SEEN
YEAST WET PREP: NONE SEEN

## 2015-02-01 LAB — URINE MICROSCOPIC-ADD ON

## 2015-02-01 LAB — HCG, QUANTITATIVE, PREGNANCY: hCG, Beta Chain, Quant, S: 120 m[IU]/mL — ABNORMAL HIGH (ref <5)

## 2015-02-01 LAB — POCT PREGNANCY, URINE: PREG TEST UR: POSITIVE — AB

## 2015-02-01 NOTE — MAU Provider Note (Signed)
History     CSN: [id number]  Arrival date and time: 02/01/15 [id number]   First Provider Initiated Contact with Patient 02/01/15 1101      No chief complaint on file.  HPI  Margaret Knight is a 29 y.o. female [id number] at Unknown gestation who presents with vaginal bleeding. She had a positive pregnancy test on 3/30; on 4/1 she had intercourse and noticed spotting following intercourse. The spotting has been off and on since 4/1; currently the bleeding is light and is described as pink; darker at times. She currently denies pain.   Of note she had a termination of twins on December 21, 2014; [facility] She never went back for a follow up appointment. She bled a large amount and feels that she passed the pregnancy. Following the termination she has had unprotected intercourse almost every day and feels that this is a new pregnancy. She has not been using birth control; although she has been prescribed it several times.   Patient has been to MAU multiple times and attests to having a lot going on in her life including recently losing her mom. The patient voices concern about her husband who refuses to use a condom and he is unaware of the multiple TAB's. The patient has been prescribed birth control pills in the past however says she still gets pregnant on pills.     OB History    Gravida Para Term Preterm AB TAB SAB Ectopic Multiple Living   9 4 4  0 5 3 2  0 1 4      Past Medical History  Diagnosis Date  . Migraines   . History of recurrent UTI (urinary tract infection)   . Normal pregnancy, repeat 03/09/2012  . SVD (spontaneous vaginal delivery) 03/10/2012  . Chlamydia     Past Surgical History  Procedure Laterality Date  . Dilation and curettage of uterus      Family History  Problem Relation Age of Onset  . Anesthesia problems Neg Hx   . Cancer Mother     breast  . Hypertension Mother   . Diabetes Maternal Grandmother   . Diabetes Paternal  Grandmother     History  Substance Use Topics  . Smoking status: Never Smoker   . Smokeless tobacco: Never Used  . Alcohol Use: No    Allergies: No Known Allergies  Prescriptions prior to admission  Medication Sig Dispense Refill Last Dose  . metroNIDAZOLE (FLAGYL) 500 MG tablet Take 1 tablet (500 mg total) by mouth 2 (two) times daily. (Patient not taking: Reported on 02/01/2015) 14 tablet 0 Completed Course at Unknown time   Results for orders placed or performed during the hospital encounter of 02/01/15 (from the past 48 hour(s))  Urinalysis, Routine w reflex microscopic     Status: Abnormal   Collection Time: 02/01/15 10:16 AM  Result Value Ref Range   Color, Urine RED (A) YELLOW    Comment: BIOCHEMICALS MAY BE AFFECTED BY COLOR   APPearance CLOUDY (A) CLEAR   Specific Gravity, Urine >1.030 (H) 1.005 - 1.030   pH 6.0 5.0 - 8.0   Glucose, UA NEGATIVE NEGATIVE mg/dL   Hgb urine dipstick LARGE (A) NEGATIVE   Bilirubin Urine SMALL (A) NEGATIVE   Ketones, ur NEGATIVE NEGATIVE mg/dL   Protein, ur 546100 (A) NEGATIVE mg/dL   Urobilinogen, UA 1.0 0.0 - 1.0 mg/dL   Nitrite POSITIVE (A) NEGATIVE   Leukocytes, UA NEGATIVE NEGATIVE  Urine microscopic-add  on     Status: Abnormal   Collection Time: 02/01/15 10:16 AM  Result Value Ref Range   Squamous Epithelial / LPF MANY (A) RARE   WBC, UA 3-6 <3 WBC/hpf   RBC / HPF TOO NUMEROUS TO COUNT <3 RBC/hpf   Bacteria, UA MANY (A) RARE  Pregnancy, urine POC     Status: Abnormal   Collection Time: 02/01/15 10:23 AM  Result Value Ref Range   Preg Test, Ur POSITIVE (A) NEGATIVE    Comment:        THE SENSITIVITY OF THIS METHODOLOGY IS >24 mIU/mL   CBC     Status: Abnormal   Collection Time: 02/01/15 11:44 AM  Result Value Ref Range   WBC 4.4 4.0 - 10.5 K/uL   RBC 3.50 (L) 3.87 - 5.11 MIL/uL   Hemoglobin 8.9 (L) 12.0 - 15.0 g/dL   HCT 16.1 (L) 09.6 - 04.5 %   MCV 82.9 78.0 - 100.0 fL   MCH 25.4 (L) 26.0 - 34.0 pg   MCHC 30.7 30.0 - 36.0  g/dL   RDW 40.9 81.1 - 91.4 %   Platelets 413 (H) 150 - 400 K/uL  hCG, quantitative, pregnancy     Status: Abnormal   Collection Time: 02/01/15 11:45 AM  Result Value Ref Range   hCG, Beta Chain, Quant, S 120 (H) <5 mIU/mL    Comment:          GEST. AGE      CONC.  (mIU/mL)   <=1 WEEK        5 - 50     2 WEEKS       50 - 500     3 WEEKS       100 - 10,000     4 WEEKS     1,000 - 30,000     5 WEEKS     3,500 - 115,000   6-8 WEEKS     12,000 - 270,000    12 WEEKS     15,000 - 220,000        FEMALE AND NON-PREGNANT FEMALE:     LESS THAN 5 mIU/mL   Wet prep, genital     Status: Abnormal   Collection Time: 02/01/15 12:50 PM  Result Value Ref Range   Yeast Wet Prep HPF POC NONE SEEN NONE SEEN   Trich, Wet Prep NONE SEEN NONE SEEN   Clue Cells Wet Prep HPF POC FEW (A) NONE SEEN   WBC, Wet Prep HPF POC FEW (A) NONE SEEN    Comment: MODERATE BACTERIA SEEN       US Ob Comp Less 14 Wks  02/01/2015   CLINICAL DATA:  Abdominal pain. Vaginal bleeding in first trimester pregnancy. Unsure of LMP.  EXAM: OBSTETRIC <14 WK [place] AND TRANSVAGINAL OB US  TECHNIQUE: Both transabdominal and transvaginal ultrasound examinations were performed for complete evaluation of the gestation as well as the maternal uterus, adnexal regions, and pelvic cul-de-sac. Transvaginal technique was performed to assess early pregnancy.  COMPARISON:  None.  FINDINGS: No intrauterine gestational sac or other fluid collection visualized in endometrial cavity. Endometrial thickness measures 10 mm. No fibroids identified.  Both ovaries are normal in appearance. No adnexal mass or free fluid identified.  IMPRESSION: Pregnancy location not visualized sonographically. Differential diagnosis includes recent spontaneous abortion, IUP too early to visualize, and occult ectopic pregnancy. Recommend close follow up of quantitative B-HCG levels, and follow up US as clinically warranted.   Electronically Signed  By: Myles Rosenthal M.D.   On:  02/01/2015 12:26   US Ob Transvaginal  02/01/2015   CLINICAL DATA:  Abdominal pain. Vaginal bleeding in first trimester pregnancy. Unsure of LMP.  EXAM: OBSTETRIC <14 WK [place] AND TRANSVAGINAL OB US  TECHNIQUE: Both transabdominal and transvaginal ultrasound examinations were performed for complete evaluation of the gestation as well as the maternal uterus, adnexal regions, and pelvic cul-de-sac. Transvaginal technique was performed to assess early pregnancy.  COMPARISON:  None.  FINDINGS: No intrauterine gestational sac or other fluid collection visualized in endometrial cavity. Endometrial thickness measures 10 mm. No fibroids identified.  Both ovaries are normal in appearance. No adnexal mass or free fluid identified.  IMPRESSION: Pregnancy location not visualized sonographically. Differential diagnosis includes recent spontaneous abortion, IUP too early to visualize, and occult ectopic pregnancy. Recommend close follow up of quantitative B-HCG levels, and follow up US as clinically warranted.   Electronically Signed   By: Myles Rosenthal M.D.   On: 02/01/2015 12:26    Review of Systems  Constitutional: Negative for fever and chills.  Gastrointestinal: Negative for nausea, vomiting and abdominal pain.  Genitourinary: Negative for dysuria, urgency and frequency.       Small amount of dark red vaginal blood noted. Patient not wearing a pad.    Physical Exam   Blood pressure 128/79, pulse 72, temperature 98.9 F (37.2 C), temperature source Oral, resp. rate 16, height  (1.702 m), weight 68.153 kg (150 lb 4 oz), not currently breastfeeding.  Physical Exam  Constitutional: She is oriented to person, place, and time. She appears well-developed and well-nourished. No distress.  HENT:  Head: Normocephalic.  Eyes: Pupils are equal, round, and reactive to light.  Neck: Neck supple.  Respiratory: Effort normal.  Genitourinary:  Wet prep and GC collected by RN   Musculoskeletal: Normal range of motion.   Neurological: She is alert and oriented to person, place, and time.  Skin: Skin is warm. She is not diaphoretic.  Psychiatric: Her behavior is normal. Her mood appears anxious. She exhibits a depressed mood.  Patient is very tearful during visit to MAU. Patient says she has a lot going on in her life.     MAU Course  Procedures  None  MDM  O positive blood type   Discussed with the patient that it is uncertain whether this is a new pregnancy or if this positive quant/pregnancy test is a result of her TAB.  Chaplin called to see the patient.   Urine culture pending; patient is asymptomatic; will treat if culture +  Assessment and Plan   A:  1. Pregnancy of unknown anatomic location   2. Vaginal bleeding in pregnancy, first trimester    P:  Discharge home in stable condition Return to MAU in 48 hours for repeat quant Pelvic rest Ectopic precautions Return to MAU sooner, if symptoms worsen.   Duane Lope, NP 02/01/2015 12:54 PM

## 2015-02-01 NOTE — MAU Note (Signed)
Pt states had TAB 12/21/2014. Took pills for abortion. Never followed up r/t pt's mother passing away very shortly after. Had +upt four days ago, and is not sure if test is still positive from prior pregnancy. States husband is uncircumsized and has had infections before. Has fishy smelling white odorous discharge. Discharge noted x1 month.

## 2015-02-01 NOTE — MAU Note (Signed)
PT SAYS  SHE HAD  TAB ON 2-22-   AND  SHE HAD CONTINUED BLEEDING  FROM [facility]  ( [facility]  ) ( [facility] [place] )   UNTIL 3-3  THEN STARTED  AGAIN ON 3-8  UNTIL 3-11  THEN ALL STOPPED    THEN SHE STARTED FEELING  BAD  ON 3-24   SOO THEN  TOOK HPT  ON 3-30-  POSITIVE.         STARTED SPOTTING  AGAIN ON  4-1  AFTER SEX-    AND NOW  SEES  SPOTTING   WHEN  WIPING     NO BIRTH  CONTROL     IN ROOM   - NOTHING  ON PAD.  FEELS PULLING  CRAMPS- STARTED ON Friday.

## 2015-02-01 NOTE — Progress Notes (Signed)
Margaret BloomShimika is processing a lot of different emotions right now, including the loss of her mother from breast cancer two weeks ago.  We also spent time discussing her relationship with her husband whom she called "controlling."  She did not feel unsafe, but she is hoping to make some changes.  She requested referrals for couple's counseling as well as grief support groups. She left before I was able to give her the resources, but I was able to reach her by phone and she requested that I email them to her, which I did.    Thank you.  Centex CorporationChaplain Katy Alaa Mullally Pager, [phone] 3:25 PM    02/01/15 1500  Clinical Encounter Type  Visited With Patient  Visit Type Spiritual support  Referral From Physician  Consult/Referral To Faith community (Counseling Resources)  Spiritual Encounters  Spiritual Needs Emotional  Stress Factors  Patient Stress Factors Loss of control;Major life changes;Family relationships

## 2015-02-02 LAB — URINE CULTURE

## 2015-02-02 LAB — GC/CHLAMYDIA PROBE AMP (~~LOC~~) NOT AT ARMC
Chlamydia: NEGATIVE
Neisseria Gonorrhea: NEGATIVE

## 2015-02-02 LAB — HIV ANTIBODY (ROUTINE TESTING W REFLEX): HIV Screen 4th Generation wRfx: NONREACTIVE

## 2015-02-03 ENCOUNTER — Telehealth: Payer: Self-pay | Admitting: Obstetrics and Gynecology

## 2015-02-03 NOTE — Telephone Encounter (Signed)
Patient missed beta hcg level today. Discussed the importance of follow up. Patient is unable to get off of work until Friday morning and plans to come them.

## 2015-02-04 ENCOUNTER — Inpatient Hospital Stay (HOSPITAL_COMMUNITY)
Admission: AD | Admit: 2015-02-04 | Discharge: 2015-02-04 | Disposition: A | Discharge status: Home / Self Care | Payer: Medicaid Other | Source: Ambulatory Visit | Attending: Obstetrics & Gynecology | Admitting: Obstetrics & Gynecology

## 2015-02-04 ENCOUNTER — Telehealth (HOSPITAL_COMMUNITY): Payer: Self-pay

## 2015-02-04 DIAGNOSIS — O039 Complete or unspecified spontaneous abortion without complication: Secondary | ICD-10-CM | POA: Insufficient documentation

## 2015-02-04 DIAGNOSIS — O9989 Other specified diseases and conditions complicating pregnancy, childbirth and the puerperium: Secondary | ICD-10-CM | POA: Diagnosis present

## 2015-02-04 LAB — HCG, QUANTITATIVE, PREGNANCY: hCG, Beta Chain, Quant, S: 75 m[IU]/mL — ABNORMAL HIGH (ref <5)

## 2015-02-04 NOTE — MAU Note (Signed)
Pt reprots she is her for f/u BHCG denies pain or bleeding

## 2015-02-04 NOTE — MAU Provider Note (Signed)
Margaret Knight is a 29 y.o. [phone] who returns to MAU for f/u Bhcg. She was here 3 days ago and the Bhcg was 120. She returns today and it has dropped to 75. She reports no pain or bleeding.   BP 117/65 mmHg  Pulse 64  Temp(Src) 98.1 F (36.7 C)  Resp 18  LMP  (LMP Unknown)   MDM Follow up Bhcg without pain or bleeding. Bhcg has dropped, most likely SAB. Patient to follow up in GYN Clinic in one week to repeat the lab. She will return sooner for any problems. She will continue pelvic rest until follow up. Stable for d/c without pain or bleeding.

## 2015-02-08 ENCOUNTER — Inpatient Hospital Stay (HOSPITAL_COMMUNITY)
Admission: AD | Admit: 2015-02-08 | Discharge: 2015-02-08 | Discharge status: Against Medical Advice | Payer: Medicaid Other | Source: Ambulatory Visit | Attending: Obstetrics and Gynecology | Admitting: Obstetrics and Gynecology

## 2015-02-08 ENCOUNTER — Encounter (HOSPITAL_COMMUNITY): Payer: Self-pay | Admitting: *Deleted

## 2015-02-08 DIAGNOSIS — R002 Palpitations: Secondary | ICD-10-CM | POA: Insufficient documentation

## 2015-02-08 DIAGNOSIS — Z87440 Personal history of urinary (tract) infections: Secondary | ICD-10-CM | POA: Insufficient documentation

## 2015-02-08 DIAGNOSIS — R51 Headache: Secondary | ICD-10-CM | POA: Insufficient documentation

## 2015-02-08 DIAGNOSIS — R0602 Shortness of breath: Secondary | ICD-10-CM | POA: Diagnosis not present

## 2015-02-08 NOTE — MAU Provider Note (Signed)
History     CSN: [id number]  Arrival date and time: 02/08/15 1020   First Provider Initiated Contact with Patient 02/08/15 1135      No chief complaint on file.  HPI   [name] is 29 y.o. female [phone] who presents with multiple complaints. She states over the weekend she started feeling awful and she is very concerned about her pregnancy. Symptoms include; heart racing when she gets up, SOB with activity and HA. All of the symptoms started over the weekend.  She was seen last week for + hpt and was followed due to quants and is scheduled to be seen in our clinic on Thursday for follow up beta hcg level. Her quants have dropped significantly and patient was informed that she was having a miscarriage. I saw her this past time in MAU and I was concerned that the beta hcg level was elevated from termination of twin gestation at the end of February. The patient thinks this is a new pregnancy because she has been having unprotected sex daily since the TAB.  She currently denies vaginal bleeding, does have some mild lower  Her SOB and heart racing occur only when she is up walking around; she does not have these symptoms when she is laying down. She has spent most of the weekend in bed because she feels better when she is laying in bed. She denies anxiety as a reason for the symptoms. She feels the symptoms are related to the pregnancy. Her HA is in the front of her head; she has not taken anything for the HA pain. She rates her HA pain 4/10. She does have a history of migraines.   OB History    Gravida Para Term Preterm AB TAB SAB Ectopic Multiple Living   9 4 4  0 5 3 2  0 1 4      Past Medical History  Diagnosis Date  . Migraines   . History of recurrent UTI (urinary tract infection)   . Normal pregnancy, repeat 03/09/2012  . SVD (spontaneous vaginal delivery) 03/10/2012  . Chlamydia     Past Surgical History  Procedure Laterality Date  . Dilation and curettage of uterus       Family History  Problem Relation Age of Onset  . Anesthesia problems Neg Hx   . Cancer Mother     breast  . Hypertension Mother   . Diabetes Maternal Grandmother   . Diabetes Paternal Grandmother     History  Substance Use Topics  . Smoking status: Never Smoker   . Smokeless tobacco: Never Used  . Alcohol Use: No    Allergies: No Known Allergies  Prescriptions prior to admission  Medication Sig Dispense Refill Last Dose  . metroNIDAZOLE (FLAGYL) 500 MG tablet Take 1 tablet (500 mg total) by mouth 2 (two) times daily. (Patient not taking: Reported on 02/01/2015) 14 tablet 0 More than a month at Unknown time   No results found for this or any previous visit (from the past 72 hour(s)).  Review of Systems  Constitutional: Negative for fever and chills.  Respiratory: Positive for shortness of breath (when walking ).   Gastrointestinal: Positive for abdominal pain (All across lower abdomen ). Negative for nausea, vomiting, diarrhea and constipation.  Neurological: Positive for dizziness and headaches.   Physical Exam   Blood pressure 110/62, pulse 72, temperature 98.1 F (36.7 C), temperature source Oral, resp. rate 18, height 5\' 7"  (1.702 m), weight 68.04 kg (150 lb), last  menstrual period 11/09/2014, SpO2 100 %, not currently breastfeeding.  Physical Exam  Nursing note and vitals reviewed. Constitutional: She is oriented to person, place, and time. Vital signs are normal. She appears well-developed and well-nourished.  Non-toxic appearance. She does not have a sickly appearance. She does not appear ill. No distress.  Respiratory: Effort normal.  Neurological: She is alert and oriented to person, place, and time.  Skin: She is not diaphoretic.  Psychiatric: Her behavior is normal.    MAU Course  Procedures  MDM Orthostatic vitals [place] EKG  CBC CMP HCG  Patient states we are wasting her time with all of the orders placed. She feels this is related to her pregnancy  and feels her time here has been very wasteful. The patient says she is leaving and does not desire to stay despite her symptoms.   Assessment and Plan   A:  SOB Heart palpitations   P:  Patient left AMA   Duane Lope, NP 02/08/2015 12:07 PM

## 2015-02-08 NOTE — MAU Note (Signed)
Pt. States she was here was last week due to a positive pregnancy test and felt bad at that time. Pt. States she had an abortion a month ago. Pt. Also states she had repeat labs last week and they were low.Pt. States her eyes are watering, she is dizzy and feels awful overall. Pt. States she feels her heart is racing. Here for evaluation.

## 2015-02-08 NOTE — MAU Note (Signed)
During ordered orthostatic vitals pt. Took off BP cuff and stated we are not helping her and she is wasting her time here. She stated she was leaving and going home to be seen by her primary doctor. Pt. Then left unit. Margaret EastJ. Rasch, NP notified.

## 2015-02-11 ENCOUNTER — Other Ambulatory Visit: Payer: Medicaid Other

## 2015-02-21 ENCOUNTER — Inpatient Hospital Stay (HOSPITAL_COMMUNITY)
Admission: AD | Admit: 2015-02-21 | Discharge: 2015-02-21 | Disposition: A | Discharge status: Home / Self Care | Payer: Medicaid Other | Source: Ambulatory Visit | Attending: Obstetrics & Gynecology | Admitting: Obstetrics & Gynecology

## 2015-02-21 ENCOUNTER — Encounter (HOSPITAL_COMMUNITY): Payer: Self-pay | Admitting: *Deleted

## 2015-02-21 DIAGNOSIS — N939 Abnormal uterine and vaginal bleeding, unspecified: Secondary | ICD-10-CM | POA: Insufficient documentation

## 2015-02-21 DIAGNOSIS — Z3202 Encounter for pregnancy test, result negative: Secondary | ICD-10-CM | POA: Insufficient documentation

## 2015-02-21 LAB — URINALYSIS, ROUTINE W REFLEX MICROSCOPIC
Bilirubin Urine: NEGATIVE
GLUCOSE, UA: NEGATIVE mg/dL
Ketones, ur: NEGATIVE mg/dL
LEUKOCYTES UA: NEGATIVE
Nitrite: NEGATIVE
Protein, ur: NEGATIVE mg/dL
Specific Gravity, Urine: >1.03 — ABNORMAL HIGH (ref 1.005–1.030)
Urobilinogen, UA: 0.2 mg/dL (ref 0.0–1.0)
pH: 5.5 (ref 5.0–8.0)

## 2015-02-21 LAB — URINE MICROSCOPIC-ADD ON

## 2015-02-21 NOTE — MAU Note (Signed)
Pt presents to MAU with complaints of vaginal bleeding and states she has been evaluated in early April for her pregnancy and feels that she has not been taken serious about her vaginal bleeding in early pregnancy. Denies any abdominal cramping

## 2015-02-21 NOTE — MAU Provider Note (Signed)
History     CSN: [id number]  Arrival date and time: 02/21/15 7829  Seen by provider at 1015.    Chief Complaint  Patient presents with  . Possible Pregnancy   HPI Margaret Knight 29 y.o.  Seen in MAU last on 02-01-15 and was told she was having a miscarriage as her quant levels were falling.  Comes to MAU today as she is having vaginal bleeding for 3 days but is not seeing "anything pass".  Is worried and thinks the baby is still insider her.  Today's pregnancy test is negative.  Has 3 children with her and her partner.     OB History    Gravida Para Term Preterm AB TAB SAB Ectopic Multiple Living   0 0 1 4      Past Medical History  Diagnosis Date  . Migraines   . History of recurrent UTI (urinary tract infection)   . Normal pregnancy, repeat 03/09/2012  . SVD (spontaneous vaginal delivery) 03/10/2012  . Chlamydia     Past Surgical History  Procedure Laterality Date  . Dilation and curettage of uterus      Family History  Problem Relation Age of Onset  . Anesthesia problems Neg Hx   . Cancer Mother     breast  . Hypertension Mother   . Diabetes Maternal Grandmother   . Diabetes Paternal Grandmother     History  Substance Use Topics  . Smoking status: Never Smoker   . Smokeless tobacco: Never Used  . Alcohol Use: No    Allergies: No Known Allergies  Prescriptions prior to admission  Medication Sig Dispense Refill Last Dose  . metroNIDAZOLE (FLAGYL) 500 MG tablet Take 1 tablet (500 mg total) by mouth 2 (two) times daily. (Patient not taking: Reported on 02/01/2015) 14 tablet 0 More than a month at Unknown time    Review of Systems  Constitutional: Negative for fever.  Gastrointestinal: Negative for nausea, vomiting and abdominal pain.  Genitourinary:       No vaginal discharge. Vaginal bleeding. No dysuria.   Physical Exam   Blood pressure 125/78, pulse 67, temperature 98.9 F (37.2 C), resp. rate 18, last menstrual period 11/09/2014, not  currently breastfeeding.  Physical Exam  Nursing note and vitals reviewed. Constitutional: She is oriented to person, place, and time. She appears well-developed and well-nourished.  HENT:  Head: Normocephalic.  Eyes: EOM are normal.  Neck: Neck supple.  Musculoskeletal: Normal range of motion.  Neurological: She is alert and oriented to person, place, and time.  Skin: Skin is warm and dry.  Psychiatric: She has a normal mood and affect.    MAU Course  Procedures Pregnancy test today is negative.  MDM Reviewed client's chart notes, ultrasound results and labs for previous visits.  Client is worried that a dead baby is inside her - has been bleeding for 3 days and has not seen anything pass as she expected with a miscarriage.  Explained that no baby formed based on last ultrasound when quants were dropping so she would not see anything pass.  Client's question was with 2 pregnancy losses, would she have any problems getting pregnant.  Advised she has had children previously and likely will conceive without problems.  Advised she needs to have time from this last miscarriage.  Advised condoms with intercourse every time until she has completed at least 3 menstrual cycles.  Partner acknowledges understanding.  Client has no further questions and asks  to go home.  Assessment and Plan  Menstrual bleeding Negative pregnancy test  [facility] as she wants to have another baby.   Condoms with sex until 3 menstrual cycles completed. If has cramping with menses, take Ibuprofen which client acknowledges she has at home.   Margaret Knight 02/21/2015, 10:14 AM

## 2015-02-21 NOTE — Discharge Instructions (Signed)
Your pregnancy test today is negative. The vaginal bleeding you are having is most likely your regular period. Condoms always with sex until you have completed 3 regular menstrual cycles. Make an appointment and get established with a provider who can assist you.

## 2015-02-22 LAB — POCT PREGNANCY, URINE: PREG TEST UR: NEGATIVE

## 2015-04-02 ENCOUNTER — Encounter (HOSPITAL_COMMUNITY): Payer: Self-pay | Admitting: Emergency Medicine

## 2015-04-02 ENCOUNTER — Emergency Department (HOSPITAL_COMMUNITY)
Admission: EM | Admit: 2015-04-02 | Discharge: 2015-04-02 | Disposition: A | Discharge status: Home / Self Care | Payer: Medicaid Other | Attending: Emergency Medicine | Admitting: Emergency Medicine

## 2015-04-02 DIAGNOSIS — H109 Unspecified conjunctivitis: Secondary | ICD-10-CM | POA: Insufficient documentation

## 2015-04-02 DIAGNOSIS — Z8679 Personal history of other diseases of the circulatory system: Secondary | ICD-10-CM | POA: Diagnosis not present

## 2015-04-02 DIAGNOSIS — H5711 Ocular pain, right eye: Secondary | ICD-10-CM | POA: Diagnosis present

## 2015-04-02 DIAGNOSIS — Z8619 Personal history of other infectious and parasitic diseases: Secondary | ICD-10-CM | POA: Diagnosis not present

## 2015-04-02 DIAGNOSIS — Z792 Long term (current) use of antibiotics: Secondary | ICD-10-CM | POA: Diagnosis not present

## 2015-04-02 DIAGNOSIS — Z87440 Personal history of urinary (tract) infections: Secondary | ICD-10-CM | POA: Diagnosis not present

## 2015-04-02 MED ORDER — FLUORESCEIN SODIUM 1 MG OP STRP
1.0000 | ORAL_STRIP | Freq: Once | OPHTHALMIC | Status: AC
  Administered 2015-04-02: 1 via OPHTHALMIC
  Filled 2015-04-02: qty 1

## 2015-04-02 MED ORDER — CIPROFLOXACIN HCL 0.3 % OP SOLN: 1.0000 [drp] | OPHTHALMIC | Status: DC

## 2015-04-02 MED ORDER — TETRACAINE HCL 0.5 % OP SOLN
1.0000 [drp] | Freq: Once | OPHTHALMIC | Status: AC
  Administered 2015-04-02: 1 [drp] via OPHTHALMIC
  Filled 2015-04-02: qty 2

## 2015-04-02 NOTE — ED Provider Notes (Signed)
CSN: [id number]     Arrival date & time 04/02/15  1731 History  This chart was scribed for Roxy Horsemanobert Chany Woolworth, PA-C, working with Richardean Canalavid H Yao, MD by Chestine SporeSoijett Blue, ED Scribe. The patient was seen in room TR05C/TR05C at 6:34 PM.    Chief Complaint  Patient presents with  . Eye Pain      The history is provided by the patient. No language interpreter was used.     Margaret Knight is a 29 y.o. female who presents to the Emergency Department complaining of right eye pain onset 2 days. She reports that her three children have pink eye and she recently used a washcloth that was used by her affected children. Pt wears contacts but she is not currently wearing them. Pt is having associated symptoms of right eye redness, watery eyes, and photophobia. She notes that she has not tried any medications for the relief of her symptoms. She denies visual disturbance, eye discharge, and any other symptoms. Denies allergies to medications.   Past Medical History  Diagnosis Date  . Migraines   . History of recurrent UTI (urinary tract infection)   . Normal pregnancy, repeat 03/09/2012  . SVD (spontaneous vaginal delivery) 03/10/2012  . Chlamydia    Past Surgical History  Procedure Laterality Date  . Dilation and curettage of uterus     Family History  Problem Relation Age of Onset  . Anesthesia problems Neg Hx   . Cancer Mother     breast  . Hypertension Mother   . Diabetes Maternal Grandmother   . Diabetes Paternal Grandmother    History  Substance Use Topics  . Smoking status: Never Smoker   . Smokeless tobacco: Never Used  . Alcohol Use: No   OB History    Gravida Para Term Preterm AB TAB SAB Ectopic Multiple Living   9 4 4  0 5 3 2  0 1 4     Review of Systems  Constitutional: Negative for fever and chills.  Eyes: Positive for photophobia, pain (right eye) and redness (right eye). Negative for discharge and visual disturbance.       Watery eyes  All other systems reviewed and are  negative.     Allergies  Review of patient's allergies indicates no known allergies.  Home Medications   Prior to Admission medications   Medication Sig Start Date End Date Taking? Authorizing Provider  Hyprom-Naphaz-Polysorb-Zn Sulf (CLEAR EYES COMPLETE OP) Place 2 drops into the right eye daily as needed (eye pain).   Yes Historical Provider, MD  levonorgestrel (MIRENA) 20 MCG/24HR IUD 1 each by Intrauterine route once. May 2015   Yes Historical Provider, MD  metroNIDAZOLE (FLAGYL) 500 MG tablet Take 500 mg by mouth 2 (two) times daily.    Yes Historical Provider, MD   BP 109/73 mmHg  Pulse 72  Temp(Src) 98.5 F (36.9 C) (Oral)  Resp 16  Ht 5\' 7"  (1.702 m)  Wt 140 lb (63.504 kg)  BMI 21.92 kg/m2  SpO2 100%  LMP 03/19/2015 Physical Exam  Constitutional: She is oriented to person, place, and time. She appears well-developed and well-nourished. No distress.  HENT:  Head: Normocephalic and atraumatic.  Eyes: EOM are normal.  Right eye conjunctiva is erythematous, normal extraocular movements, no fluorescein uptake, no foreign body  Neck: Neck supple. No tracheal deviation present.  Cardiovascular: Normal rate.   Pulmonary/Chest: Effort normal. No respiratory distress.  Musculoskeletal: Normal range of motion.  Neurological: She is alert and oriented to person, place,  and time.  Skin: Skin is warm and dry.  Psychiatric: She has a normal mood and affect. Her behavior is normal.  Nursing note and vitals reviewed.   ED Course  Procedures (including critical care time) DIAGNOSTIC STUDIES: Oxygen Saturation is 100% on RA, nl by my interpretation.    COORDINATION OF CARE: 6:37 PM-Discussed treatment plan which includes visual acuity with pt at bedside and pt agreed to plan.   Labs Review Labs Reviewed - No data to display  Imaging Review No results found.   EKG Interpretation None      MDM   Final diagnoses:  Conjunctivitis of right eye    Patient with right  eye conjunctivitis. She is contact lens wear. No foreign body. Will treat with Cipro drops. Recommend ophthalmology follow-up if symptoms do not improve. Patient understands agrees the plan. She is stable and ready for discharge.  I personally performed the services described in this documentation, which was scribed in my presence. The recorded information has been reviewed and is accurate.     Roxy Horseman, PA-C 04/02/15 1918  Richardean Canal, MD 04/02/15 [phone]

## 2015-04-02 NOTE — ED Notes (Signed)
Pt c/o pain and redness to right eye x's 2 days

## 2015-04-02 NOTE — Discharge Instructions (Signed)

## 2015-07-12 ENCOUNTER — Inpatient Hospital Stay (HOSPITAL_COMMUNITY): Payer: Medicaid Other

## 2015-07-12 ENCOUNTER — Inpatient Hospital Stay (HOSPITAL_COMMUNITY)
Admission: AD | Admit: 2015-07-12 | Discharge: 2015-07-12 | Disposition: A | Discharge status: Home / Self Care | Payer: Medicaid Other | Source: Ambulatory Visit | Attending: Family Medicine | Admitting: Family Medicine

## 2015-07-12 ENCOUNTER — Encounter (HOSPITAL_COMMUNITY): Payer: Self-pay | Admitting: *Deleted

## 2015-07-12 DIAGNOSIS — O26899 Other specified pregnancy related conditions, unspecified trimester: Secondary | ICD-10-CM

## 2015-07-12 DIAGNOSIS — R109 Unspecified abdominal pain: Secondary | ICD-10-CM

## 2015-07-12 DIAGNOSIS — O2341 Unspecified infection of urinary tract in pregnancy, first trimester: Secondary | ICD-10-CM

## 2015-07-12 DIAGNOSIS — O98819 Other maternal infectious and parasitic diseases complicating pregnancy, unspecified trimester: Secondary | ICD-10-CM | POA: Diagnosis not present

## 2015-07-12 DIAGNOSIS — Z3A00 Weeks of gestation of pregnancy not specified: Secondary | ICD-10-CM | POA: Diagnosis not present

## 2015-07-12 DIAGNOSIS — N898 Other specified noninflammatory disorders of vagina: Secondary | ICD-10-CM | POA: Diagnosis present

## 2015-07-12 DIAGNOSIS — B373 Candidiasis of vulva and vagina: Secondary | ICD-10-CM

## 2015-07-12 DIAGNOSIS — O2340 Unspecified infection of urinary tract in pregnancy, unspecified trimester: Secondary | ICD-10-CM | POA: Insufficient documentation

## 2015-07-12 DIAGNOSIS — Z331 Pregnant state, incidental: Secondary | ICD-10-CM

## 2015-07-12 DIAGNOSIS — B3731 Acute candidiasis of vulva and vagina: Secondary | ICD-10-CM

## 2015-07-12 DIAGNOSIS — T8331XA Breakdown (mechanical) of intrauterine contraceptive device, initial encounter: Secondary | ICD-10-CM

## 2015-07-12 DIAGNOSIS — O9989 Other specified diseases and conditions complicating pregnancy, childbirth and the puerperium: Secondary | ICD-10-CM

## 2015-07-12 DIAGNOSIS — O98811 Other maternal infectious and parasitic diseases complicating pregnancy, first trimester: Secondary | ICD-10-CM

## 2015-07-12 DIAGNOSIS — N3000 Acute cystitis without hematuria: Secondary | ICD-10-CM

## 2015-07-12 LAB — CBC WITH DIFFERENTIAL/PLATELET
BASOS PCT: 0 % (ref 0–1)
Basophils Absolute: 0 10*3/uL (ref 0.0–0.1)
EOS ABS: 0.1 10*3/uL (ref 0.0–0.7)
Eosinophils Relative: 1 % (ref 0–5)
HEMATOCRIT: 30.6 % — AB (ref 36.0–46.0)
HEMOGLOBIN: 9.5 g/dL — AB (ref 12.0–15.0)
Lymphocytes Relative: 47 % — ABNORMAL HIGH (ref 12–46)
Lymphs Abs: 2.5 10*3/uL (ref 0.7–4.0)
MCH: 24.7 pg — ABNORMAL LOW (ref 26.0–34.0)
MCHC: 31 g/dL (ref 30.0–36.0)
MCV: 79.7 fL (ref 78.0–100.0)
Monocytes Absolute: 0.4 10*3/uL (ref 0.1–1.0)
Monocytes Relative: 8 % (ref 3–12)
NEUTROS ABS: 2.4 10*3/uL (ref 1.7–7.7)
NEUTROS PCT: 44 % (ref 43–77)
Platelets: 464 10*3/uL — ABNORMAL HIGH (ref 150–400)
RBC: 3.84 MIL/uL — AB (ref 3.87–5.11)
RDW: 16.7 % — ABNORMAL HIGH (ref 11.5–15.5)
WBC: 5.5 10*3/uL (ref 4.0–10.5)

## 2015-07-12 LAB — POCT PREGNANCY, URINE: Preg Test, Ur: POSITIVE — AB

## 2015-07-12 LAB — WET PREP, GENITAL
CLUE CELLS WET PREP: NONE SEEN
Trich, Wet Prep: NONE SEEN
YEAST WET PREP: NONE SEEN

## 2015-07-12 LAB — URINALYSIS, ROUTINE W REFLEX MICROSCOPIC
Bilirubin Urine: NEGATIVE
Glucose, UA: NEGATIVE mg/dL
HGB URINE DIPSTICK: NEGATIVE
Ketones, ur: NEGATIVE mg/dL
LEUKOCYTES UA: NEGATIVE
Nitrite: POSITIVE — AB
PH: 6 (ref 5.0–8.0)
Protein, ur: NEGATIVE mg/dL
Specific Gravity, Urine: 1.025 (ref 1.005–1.030)
Urobilinogen, UA: 0.2 mg/dL (ref 0.0–1.0)

## 2015-07-12 LAB — URINE MICROSCOPIC-ADD ON

## 2015-07-12 LAB — HCG, QUANTITATIVE, PREGNANCY: hCG, Beta Chain, Quant, S: 1350 m[IU]/mL — ABNORMAL HIGH (ref <5)

## 2015-07-12 MED ORDER — CEPHALEXIN 500 MG PO CAPS: 500.0000 mg | ORAL_CAPSULE | Freq: Four times a day (QID) | ORAL | Status: DC

## 2015-07-12 MED ORDER — FLUCONAZOLE 150 MG PO TABS: 150.0000 mg | ORAL_TABLET | Freq: Every day | ORAL | Status: DC

## 2015-07-12 NOTE — Discharge Instructions (Signed)
First Trimester of Pregnancy The first trimester of pregnancy is from week 1 until the end of week 12 (months 1 through 3). During this time, your baby will begin to develop inside you. At 6-8 weeks, the eyes and face are formed, and the heartbeat can be seen on ultrasound. At the end of 12 weeks, all the baby's organs are formed. Prenatal care is all the medical care you receive before the birth of your baby. Make sure you get good prenatal care and follow all of your doctor's instructions. HOME CARE  Medicines  Take medicine only as told by your doctor. Some medicines are safe and some are not during pregnancy.  Take your prenatal vitamins as told by your doctor.  Take medicine that helps you poop (stool softener) as needed if your doctor says it is okay. Diet  Eat regular, healthy meals.  Your doctor will tell you the amount of weight gain that is right for you.  Avoid raw meat and uncooked cheese.  If you feel sick to your stomach (nauseous) or throw up (vomit):  Eat 4 or 5 small meals a day instead of 3 large meals.  Try eating a few soda crackers.  Drink liquids between meals instead of during meals.  If you have a hard time pooping (constipation):  Eat high-fiber foods like fresh vegetables, fruit, and whole grains.  Drink enough fluids to keep your pee (urine) clear or pale yellow. Activity and Exercise  Exercise only as told by your doctor. Stop exercising if you have cramps or pain in your lower belly (abdomen) or low back.  Try to avoid standing for long periods of time. Move your legs often if you must stand in one place for a long time.  Avoid heavy lifting.  Wear low-heeled shoes. Sit and stand up straight.  You can have sex unless your doctor tells you not to. Relief of Pain or Discomfort  Wear a good support bra if your breasts are sore.  Take warm water baths (sitz baths) to soothe pain or discomfort caused by hemorrhoids. Use hemorrhoid cream if your  doctor says it is okay.  Rest with your legs raised if you have leg cramps or low back pain.  Wear support hose if you have puffy, bulging veins (varicose veins) in your legs. Raise (elevate) your feet for 15 minutes, 3-4 times a day. Limit salt in your diet. Prenatal Care  Schedule your prenatal visits by the twelfth week of pregnancy.  Write down your questions. Take them to your prenatal visits.  Keep all your prenatal visits as told by your doctor. Safety  Wear your seat belt at all times when driving.  Make a list of emergency phone numbers. The list should include numbers for family, friends, the hospital, and police and fire departments. General Tips  Ask your doctor for a referral to a local prenatal class. Begin classes no later than at the start of month 6 of your pregnancy.  Ask for help if you need counseling or help with nutrition. Your doctor can give you advice or tell you where to go for help.  Do not use hot tubs, steam rooms, or saunas.  Do not douche or use tampons or scented sanitary pads.  Do not cross your legs for long periods of time.  Avoid litter boxes and soil used by cats.  Avoid all smoking, herbs, and alcohol. Avoid drugs not approved by your doctor.  Visit your dentist. At home, brush your teeth   with a soft toothbrush. Be gentle when you floss. GET HELP IF:  You are dizzy.  You have mild cramps or pressure in your lower belly.  You have a nagging pain in your belly area.  You continue to feel sick to your stomach, throw up, or have watery poop (diarrhea).  You have a bad smelling fluid coming from your vagina.  You have pain with peeing (urination).  You have increased puffiness (swelling) in your face, hands, legs, or ankles. GET HELP RIGHT AWAY IF:   You have a fever.  You are leaking fluid from your vagina.  You have spotting or bleeding from your vagina.  You have very bad belly cramping or pain.  You gain or lose weight  rapidly.  You throw up blood. It may look like coffee grounds.  You are around people who have German measles, fifth disease, or chickenpox.  You have a very bad headache.  You have shortness of breath.  You have any kind of trauma, such as from a fall or a car accident. Document Released: 04/03/2008 Document Revised: 03/02/2014 Document Reviewed: 08/26/2013 Exit[employer] Patient Information 2015 [employer]. This information is not intended to replace advice given to you by your health care provider. Make sure you discuss any questions you have with your health care provider.  

## 2015-07-12 NOTE — MAU Provider Note (Signed)
History     CSN: [id number]  Arrival date and time: 07/12/15 1741   First Provider Initiated Contact with Patient 07/12/15 1823      Chief Complaint  Patient presents with  . Possible Pregnancy  . Vaginal Discharge   HPI  Ms. EMMELYN SCHMALE is a 29 y.o. [id number] at Unknown who presents to MAU today with complaint of possible pregnancy, IUD in place and vaginal irritation. She states that she had IUD placed in March. She has not had a period since then. She states recent +HPT. She denies abdominal pain, vaginal bleeding, fever or N/V/D. She does endorse significant vaginal irritation and off-white discharge. She states that she tried OTC Monistat without relief.   OB History    Gravida Para Term Preterm AB TAB SAB Ectopic Multiple Living   10 4 4  0 5 3 2  0 1 4      Past Medical History  Diagnosis Date  . Migraines   . History of recurrent UTI (urinary tract infection)   . Normal pregnancy, repeat 03/09/2012  . SVD (spontaneous vaginal delivery) 03/10/2012  . Chlamydia     Past Surgical History  Procedure Laterality Date  . Dilation and curettage of uterus      Family History  Problem Relation Age of Onset  . Anesthesia problems Neg Hx   . Cancer Mother     breast  . Hypertension Mother   . Diabetes Maternal Grandmother   . Diabetes Paternal Grandmother     Social History  Substance Use Topics  . Smoking status: Never Smoker   . Smokeless tobacco: Never Used  . Alcohol Use: No    Allergies: No Known Allergies  No prescriptions prior to admission    Review of Systems  Constitutional: Negative for fever and malaise/fatigue.  Gastrointestinal: Negative for nausea, vomiting, abdominal pain, diarrhea and constipation.  Genitourinary: Negative for dysuria, urgency and frequency.       Neg - vaginal bleeding + vaginal discharge, irritation   Physical Exam   Blood pressure 118/68, pulse 67, temperature 98.6 F (37 C), temperature source Oral, resp. rate 18,  last menstrual period 03/19/2015, SpO2 100 %, not currently breastfeeding.  Physical Exam  Nursing note and vitals reviewed. Constitutional: She is oriented to person, place, and time. She appears well-developed and well-nourished. No distress.  HENT:  Head: Normocephalic and atraumatic.  Cardiovascular: Normal rate.   Respiratory: Effort normal.  GI: Soft. She exhibits no distension and no mass. There is no tenderness. There is no rebound and no guarding.  Genitourinary: Uterus is not enlarged and not tender. Cervix exhibits friability. Cervix exhibits no motion tenderness and no discharge. Right adnexum displays no mass and no tenderness. Left adnexum displays no mass and no tenderness. No bleeding in the vagina. Vaginal discharge (small amount of mucus discharge) found.  Neurological: She is alert and oriented to person, place, and time.  Skin: Skin is warm and dry. No erythema.  Psychiatric: She has a normal mood and affect.   Results for orders placed or performed during the hospital encounter of 07/12/15 (from the past 24 hour(s))  Urinalysis, Routine w reflex microscopic (not at [facility])     Status: Abnormal   Collection Time: 07/12/15  5:50 PM  Result Value Ref Range   Color, Urine YELLOW YELLOW   APPearance CLEAR CLEAR   Specific Gravity, Urine 1.025 1.005 - 1.030   pH 6.0 5.0 - 8.0   Glucose, UA NEGATIVE NEGATIVE mg/dL   Hgb  urine dipstick NEGATIVE NEGATIVE   Bilirubin Urine NEGATIVE NEGATIVE   Ketones, ur NEGATIVE NEGATIVE mg/dL   Protein, ur NEGATIVE NEGATIVE mg/dL   Urobilinogen, UA 0.2 0.0 - 1.0 mg/dL   Nitrite POSITIVE (A) NEGATIVE   Leukocytes, UA NEGATIVE NEGATIVE  Urine microscopic-add on     Status: Abnormal   Collection Time: 07/12/15  5:50 PM  Result Value Ref Range   Squamous Epithelial / LPF RARE RARE   WBC, UA 0-2 <3 WBC/hpf   RBC / HPF 0-2 <3 RBC/hpf   Bacteria, UA MANY (A) RARE  Pregnancy, urine POC     Status: Abnormal   Collection Time: 07/12/15  6:03  PM  Result Value Ref Range   Preg Test, Ur POSITIVE (A) NEGATIVE  CBC with Differential/Platelet     Status: Abnormal   Collection Time: 07/12/15  6:41 PM  Result Value Ref Range   WBC 5.5 4.0 - 10.5 K/uL   RBC 3.84 (L) 3.87 - 5.11 MIL/uL   Hemoglobin 9.5 (L) 12.0 - 15.0 g/dL   HCT 86.5 (L) 78.4 - 69.6 %   MCV 79.7 78.0 - 100.0 fL   MCH 24.7 (L) 26.0 - 34.0 pg   MCHC 31.0 30.0 - 36.0 g/dL   RDW 29.5 (H) 28.4 - 13.2 %   Platelets 464 (H) 150 - 400 K/uL   Neutrophils Relative % 44 43 - 77 %   Neutro Abs 2.4 1.7 - 7.7 K/uL   Lymphocytes Relative 47 (H) 12 - 46 %   Lymphs Abs 2.5 0.7 - 4.0 K/uL   Monocytes Relative 8 3 - 12 %   Monocytes Absolute 0.4 0.1 - 1.0 K/uL   Eosinophils Relative 1 0 - 5 %   Eosinophils Absolute 0.1 0.0 - 0.7 K/uL   Basophils Relative 0 0 - 1 %   Basophils Absolute 0.0 0.0 - 0.1 K/uL  hCG, quantitative, pregnancy     Status: Abnormal   Collection Time: 07/12/15  6:41 PM  Result Value Ref Range   hCG, Beta Chain, Quant, S 1350 (H) <5 mIU/mL  Wet prep, genital     Status: Abnormal   Collection Time: 07/12/15  8:00 PM  Result Value Ref Range   Yeast Wet Prep HPF POC NONE SEEN NONE SEEN   Trich, Wet Prep NONE SEEN NONE SEEN   Clue Cells Wet Prep HPF POC NONE SEEN NONE SEEN   WBC, Wet Prep HPF POC MODERATE (A) NONE SEEN   US Ob Comp Less 14 Wks  07/12/2015   CLINICAL DATA:  Pain. Intrauterine device. Pregnancy. Unknown gestational age. Quantitative beta HCG is 1,350. Back pain for 2 weeks. Vaginal discharge. 20 lb weight gain. LMP 5 months ago. Gravida 10 para 4 TAB 3 SAB 2.  EXAM: OBSTETRIC <14 WK [place] AND TRANSVAGINAL OB US  TECHNIQUE: Both transabdominal and transvaginal ultrasound examinations were performed for complete evaluation of the gestation as well as the maternal uterus, adnexal regions, and pelvic cul-de-sac. Transvaginal technique was performed to assess early pregnancy.  COMPARISON:  None applicable  FINDINGS: Intrauterine gestational sac: Not seen   Yolk sac:  Not seen  Embryo:  Not seen  Cardiac Activity: Not seen  Maternal uterus/adnexae: Small left corpus luteum cyst is identified. The right ovary has a normal appearance. There is trace free pelvic fluid. Intrauterine device is not identified.  IMPRESSION: 1. No intrauterine or ectopic pregnancy identified at this time. 2. Serial quantitative beta HCG values and follow-up ultrasound are recommended as appropriate  to document progression of and location of pregnancy. Ectopic pregnancy has not been excluded. 3. Intrauterine device is not identified in the uterus.   Electronically Signed   By: Norva Pavlov M.D.   On: 07/12/2015 19:49   US Ob Transvaginal  07/12/2015   CLINICAL DATA:  Pain. Intrauterine device. Pregnancy. Unknown gestational age. Quantitative beta HCG is 1,350. Back pain for 2 weeks. Vaginal discharge. 20 lb weight gain. LMP 5 months ago. Gravida 10 para 4 TAB 3 SAB 2.  EXAM: OBSTETRIC <14 WK [place] AND TRANSVAGINAL OB US  TECHNIQUE: Both transabdominal and transvaginal ultrasound examinations were performed for complete evaluation of the gestation as well as the maternal uterus, adnexal regions, and pelvic cul-de-sac. Transvaginal technique was performed to assess early pregnancy.  COMPARISON:  None applicable  FINDINGS: Intrauterine gestational sac: Not seen  Yolk sac:  Not seen  Embryo:  Not seen  Cardiac Activity: Not seen  Maternal uterus/adnexae: Small left corpus luteum cyst is identified. The right ovary has a normal appearance. There is trace free pelvic fluid. Intrauterine device is not identified.  IMPRESSION: 1. No intrauterine or ectopic pregnancy identified at this time. 2. Serial quantitative beta HCG values and follow-up ultrasound are recommended as appropriate to document progression of and location of pregnancy. Ectopic pregnancy has not been excluded. 3. Intrauterine device is not identified in the uterus.   Electronically Signed   By: Norva Pavlov M.D.   On:  07/12/2015 19:49    MAU Course  Procedures None  MDM +UPT UA, wet prep, GC/chlamydia, CBC, quant hCG, HIV, RPR and [place] today to rule out ectopic pregnancy  Assessment and Plan  A: Pregnancy of unknown location UTI Yeast vulvovaginitis, clinical Expulsion of IUD  P: Discharge home Rx for Diflucan and Keflex given to patient Ectopic precautions discussed Patient advised to follow-up in MAU in 48 hours for repeat quant hCG Patient may return to MAU as needed or if her condition were to change or worsen   Marny Lowenstein, PA-C  07/12/2015, 8:45 PM

## 2015-07-12 NOTE — MAU Note (Addendum)
LMP x 5 months ago.  Took UPT on Friday was positive.  Currently has a mirena IUD.  Pt c/o back pain x 2 weeks in upper back.  Urine started getting cloudy and smelly and having trouble urinating and holding urine back today.  Over the past weekend, vaginal discharge was very irritating.  Used monistat and states it got worse. Pt states she has had a 20 lb weight gain.  Pt states it was related to her birth control.  Pt hasn't felt for strings since insertion of IUD about 6 months ago.

## 2015-07-13 LAB — GC/CHLAMYDIA PROBE AMP (~~LOC~~) NOT AT ARMC
CHLAMYDIA, DNA PROBE: POSITIVE — AB
Neisseria Gonorrhea: NEGATIVE

## 2015-07-13 LAB — HIV ANTIBODY (ROUTINE TESTING W REFLEX): HIV Screen 4th Generation wRfx: NONREACTIVE

## 2015-07-13 LAB — RPR: RPR: NONREACTIVE

## 2015-07-14 ENCOUNTER — Inpatient Hospital Stay (HOSPITAL_COMMUNITY)
Admission: AD | Admit: 2015-07-14 | Discharge: 2015-07-14 | Disposition: A | Discharge status: Home / Self Care | Payer: Medicaid Other | Source: Ambulatory Visit | Attending: Obstetrics & Gynecology | Admitting: Obstetrics & Gynecology

## 2015-07-14 ENCOUNTER — Encounter (HOSPITAL_COMMUNITY): Payer: Self-pay | Admitting: Advanced Practice Midwife

## 2015-07-14 DIAGNOSIS — Z3A16 16 weeks gestation of pregnancy: Secondary | ICD-10-CM | POA: Insufficient documentation

## 2015-07-14 DIAGNOSIS — A749 Chlamydial infection, unspecified: Secondary | ICD-10-CM

## 2015-07-14 DIAGNOSIS — O98811 Other maternal infectious and parasitic diseases complicating pregnancy, first trimester: Secondary | ICD-10-CM

## 2015-07-14 DIAGNOSIS — A568 Sexually transmitted chlamydial infection of other sites: Secondary | ICD-10-CM | POA: Insufficient documentation

## 2015-07-14 DIAGNOSIS — O98312 Other infections with a predominantly sexual mode of transmission complicating pregnancy, second trimester: Secondary | ICD-10-CM | POA: Insufficient documentation

## 2015-07-14 DIAGNOSIS — O26892 Other specified pregnancy related conditions, second trimester: Secondary | ICD-10-CM | POA: Diagnosis present

## 2015-07-14 LAB — HCG, QUANTITATIVE, PREGNANCY: hCG, Beta Chain, Quant, S: 5710 m[IU]/mL — ABNORMAL HIGH (ref <5)

## 2015-07-14 MED ORDER — AZITHROMYCIN 250 MG PO TABS
1000.0000 mg | ORAL_TABLET | Freq: Once | ORAL | Status: AC
  Administered 2015-07-14: 1000 mg via ORAL
  Filled 2015-07-14: qty 4

## 2015-07-14 NOTE — MAU Note (Signed)
Pt presents to MAU for repeat labs BHCG. Denies any vaginal bleeding or pain 

## 2015-07-14 NOTE — MAU Provider Note (Signed)
History     CSN: [id number]  Arrival date and time: 07/14/15 1728   None     Chief Complaint  Patient presents with  . Labs Only   The history is provided by the patient.   This is a 29 y.o. female at [redacted]w[redacted]d by LMP who presents for follow up Quant HCG level. She was seen two days ago for vaginal irritation and pregnancy with an IUD in place.   Her Quant HCG was 1350 but nothing was seen in the uterus. So she was brought back today for repeat testing.  Her Chlamydia test came back + from the last visit.  RN Note: Pt presents to MAU for repeat labs BHCG. Denies any vaginal bleeding or pain          OB History    Gravida Para Term Preterm AB TAB SAB Ectopic Multiple Living   0 0 1 4      Past Medical History  Diagnosis Date  . Migraines   . History of recurrent UTI (urinary tract infection)   . Normal pregnancy, repeat 03/09/2012  . SVD (spontaneous vaginal delivery) 03/10/2012  . Chlamydia     Past Surgical History  Procedure Laterality Date  . Dilation and curettage of uterus      Family History  Problem Relation Age of Onset  . Anesthesia problems Neg Hx   . Cancer Mother     breast  . Hypertension Mother   . Diabetes Maternal Grandmother   . Diabetes Paternal Grandmother     Social History  Substance Use Topics  . Smoking status: Never Smoker   . Smokeless tobacco: Never Used  . Alcohol Use: No    Allergies: No Known Allergies  Prescriptions prior to admission  Medication Sig Dispense Refill Last Dose  . cephALEXin (KEFLEX) 500 MG capsule Take 1 capsule (500 mg total) by mouth 4 (four) times daily. 28 capsule 0   . fluconazole (DIFLUCAN) 150 MG tablet Take 1 tablet (150 mg total) by mouth daily. 1 tablet 0   . Hyprom-Naphaz-Polysorb-Zn Sulf (CLEAR EYES COMPLETE OP) Place 2 drops into the right eye daily as needed (eye pain).   Not Taking at Unknown time   Medical, Surgical, Family and Social histories reviewed and are listed above.   Medications and allergies reviewed.   Review of Systems  Constitutional: Negative for fever, chills and malaise/fatigue.  Gastrointestinal: Negative for nausea, vomiting and abdominal pain.  Genitourinary:       No bleeding   Neurological: Negative for dizziness.  Other systems negative  Physical Exam   Blood pressure 120/67, pulse 69, temperature 98.2 F (36.8 C), last menstrual period 03/19/2015, not currently breastfeeding.  Physical Exam  Constitutional: She is oriented to person, place, and time. She appears well-developed and well-nourished. No distress.  HENT:  Head: Normocephalic.  Cardiovascular: Normal rate and regular rhythm.   Respiratory: Effort normal. No respiratory distress.  Genitourinary:  Pelvic exam not indicated  Musculoskeletal: Normal range of motion.  Neurological: She is alert and oriented to person, place, and time.  Skin: Skin is warm and dry.  Psychiatric: She has a normal mood and affect.    MAU Course  Procedures  MDM Results for orders placed or performed during the hospital encounter of 07/14/15 (from the past 24 hour(s))  hCG, quantitative, pregnancy     Status: Abnormal   Collection Time: 07/14/15  5:58 PM  Result Value Ref Range   hCG,  Beta Chain, Quant, S 5710 (H) <5 mIU/mL     Assessment and Plan  A:  Pregnancy at [redacted]w[redacted]d by LMP, probably early first trimester      Good rise in HCG level      Chlamydia infection  P:  Discharge home       Informed of rise in HCG       Plan repeat US next week       Zithromax 1gm po given       Prescription given for her partner,  Alyana Kreiter, with appropriate handout to be given to him       Follow up with prenatal care after IUPconfirmed  [facility] 07/14/2015, 5:57 PM

## 2015-07-14 NOTE — Discharge Instructions (Signed)
Expedited Partner Therapy:  Information Sheet for Patients and Partners               You have been offered expedited partner therapy (EPT). This information sheet contains important information and warnings you need to be aware of, so please read it carefully.   Expedited Partner Therapy (EPT) is the clinical practice of treating the sexual partners of persons who receive chlamydia or gonorrhea diagnoses by providing medications or prescriptions to the patient. Patients then provide partners with these therapies without the health-care provider having examined the partner. In other words, EPT is a convenient, fast and private way for patients to help their sexual partners get treated.   Chlamydia and gonorrhea are bacterial infections you get from having sex with a person who is already infected. Many people with these infections dont know it because they feel fine, but without treatment these infections can cause serious health problems, such as pelvic inflammatory disease, ectopic pregnancy, infertility and increased risk of HIV.   It is important to get treated as soon as possible to protect your health, to avoid spreading these infections to others, and to prevent yourself from becoming re-infected. The good news is these infections can be easily cured with proper antibiotic medicine. The best way to take care of your self is to see a doctor or go to your local health department. If you are not able to see a doctor or other medical provider, you should take EPT.   Recommended Medication:  EPT for Chlamydia:  Azithromycin (Zithromax) 1 gram orally in a single dose EPT for Gonorrhea:  Cefixime (Suprax) 400 milligrams orally in a single dose PLUS azithromycin (Zithromax) 1 gram orally in a single dose  These medicines are very safe. However, you should not take them if you have ever had an allergic reaction (like a rash) to any of these medicines: azithromycin (Zithromax), erythromycin,  clarithromycin (Biaxin). If you are uncertain about whether you have an allergy, call your medical provider or pharmacist before taking this medicine. If you have a serious, long-term illness like kidney, liver or heart disease, colitis or stomach problems, or you are currently taking other prescription medication, talk to your provider before taking this medication.   Women: If you have lower belly pain, pain during sex, vomiting, or a fever, do not take this medicine. Instead, you should see a medical provider to be certain you do not have pelvic inflammatory disease (PID). PID can be serious and lead to infertility, pregnancy problems or chronic pelvic pain.   Pregnant Women: It is very important for you to see a doctor to get pregnancy services and pre-natal care. These antibiotics for EPT are safe for pregnant women, but you still need to see a medical provider as soon as possible. It is also important to note that Doxycycline is an alternative therapy for chlamydia, but it should not be taken by someone who is pregnant.   Men: If you have pain or swelling in the testicles or a fever, do not take this medicine and see a medical provider.     Men who have sex with men (MSM): MSM in [place] continue to experience high rates of syphilis and HIV. Many MSM with gonorrhea or chlamydia could also have syphilis and/or HIV and not know it. If you are a man who has sex with other men, it is very important that you see a medical provider and are tested for HIV and syphilis.   Along with  this information sheet is a prescription for the medicine. If you receive a prescription it will be in your name and will indicate your date of birth, or it will be in the name of Expedited Partner Therapy.   In either case, you can have the prescription filled at a pharmacy. You will be responsible for the cost of the medicine, unless you have prescription drug coverage. In that case, you could provide your name so the  pharmacy could bill your health plan.   Take the medication as directed. Some people will have a mild, upset stomach, which does not last long. After taking the medicine, do not have sex for 7 days. Do not share this medicine or give it to anyone else. It is important to tell everyone you have had sex with in the last 60 days that they need to go and get tested for sexually transmitted infections.   Ways to prevent these and other sexually transmitted diseases (STDs):    Abstain from sex. This is the only sure way to avoid getting an STD.   Use barrier methods, such as condoms, consistently and correctly.   Limit the number of sexual partners.   Have regular physical exams, including testing for STDs.   For more information about EPT or other issues pertaining to STD, please contact your medical provider or the [facility] at [phone] or [email or web address]   Chlamydia Chlamydia is an infection. It is spread through sexual contact. Chlamydia can be in different areas of the body. These areas include the cervix, urethra, throat, or rectum. You may not know you have chlamydia because many people never develop the symptoms. Chlamydia is not difficult to treat once you know you have it. However, if it is left untreated, chlamydia can lead to more serious health problems.  CAUSES  Chlamydia is caused by bacteria. It is a sexually transmitted disease. It is passed from an infected partner during intimate contact. This contact could be with the genitals, mouth, or rectal area. Chlamydia can also be passed from mothers to babies during birth. SIGNS AND SYMPTOMS  There may not be any symptoms. This is often the case early in the infection. If symptoms develop, they may include:  Mild pain and discomfort when urinating.  Redness, soreness, and swelling (inflammation) of the rectum.  Vaginal  discharge.  Painful intercourse.  Abdominal pain.  Bleeding between menstrual periods. DIAGNOSIS  To diagnose this infection, your health care provider will do a pelvic exam. Cultures will be taken of the vagina, cervix, urine, and possibly the rectum to verify the diagnosis.  TREATMENT You will be given antibiotic medicines. If you are pregnant, certain types of antibiotics will need to be avoided. Any sexual partners should also be treated, even if they do not show symptoms.  HOME CARE INSTRUCTIONS   Take your antibiotic medicine as directed by your health care provider. Finish the antibiotic even if you start to feel better.  Take medicines only as directed by your health care provider.  Inform any sexual partners about the infection. They should also be treated.  Do not have sexual contact until your health care provider tells you it is okay.  Get plenty of rest.  Eat a well-balanced diet.  Drink enough fluids to keep your urine clear or pale yellow.  Keep all follow-up visits as directed by your health care provider. SEEK MEDICAL CARE IF:  You have painful urination.  You have abdominal pain.  You  have vaginal discharge.  You have painful sexual intercourse.  You have bleeding between periods and after sex.  You have a fever. SEEK IMMEDIATE MEDICAL CARE IF:   You experience nausea or vomiting.  You experience excessive sweating (diaphoresis).  You have difficulty swallowing. MAKE SURE YOU:   Understand these instructions.  Will watch your condition.  Will get help right away if you are not doing well or get worse. Document Released: 07/26/2005 Document Revised: 03/02/2014 Document Reviewed: 06/23/2013 [facility] Patient Information 2015 [place], [place]. This information is not intended to replace advice given to you by your health care provider. Make sure you discuss any questions you have with your health care provider.

## 2015-07-23 ENCOUNTER — Ambulatory Visit (HOSPITAL_COMMUNITY)
Admission: RE | Admit: 2015-07-23 | Discharge: 2015-07-23 | Disposition: A | Discharge status: Home / Self Care | Payer: Medicaid Other | Source: Ambulatory Visit | Attending: Advanced Practice Midwife | Admitting: Advanced Practice Midwife

## 2015-07-23 ENCOUNTER — Inpatient Hospital Stay (HOSPITAL_COMMUNITY)
Admission: AD | Admit: 2015-07-23 | Discharge: 2015-07-23 | Disposition: A | Discharge status: Home / Self Care | Payer: Medicaid Other | Source: Ambulatory Visit | Attending: Family Medicine | Admitting: Family Medicine

## 2015-07-23 ENCOUNTER — Encounter (HOSPITAL_COMMUNITY): Payer: Self-pay | Admitting: Medical

## 2015-07-23 DIAGNOSIS — Z3A01 Less than 8 weeks gestation of pregnancy: Secondary | ICD-10-CM | POA: Diagnosis not present

## 2015-07-23 DIAGNOSIS — O208 Other hemorrhage in early pregnancy: Secondary | ICD-10-CM | POA: Insufficient documentation

## 2015-07-23 DIAGNOSIS — O284 Abnormal radiological finding on antenatal screening of mother: Secondary | ICD-10-CM | POA: Insufficient documentation

## 2015-07-23 DIAGNOSIS — O26899 Other specified pregnancy related conditions, unspecified trimester: Secondary | ICD-10-CM

## 2015-07-23 DIAGNOSIS — R109 Unspecified abdominal pain: Principal | ICD-10-CM

## 2015-07-23 NOTE — MAU Provider Note (Signed)
Margaret Knight is a 29 y.o. [id number] at [redacted]w[redacted]d who presents to MAU today for follow-up US results. She denies abdominal pain, vaginal bleeding, N/V or fever today.   LMP 03/19/2015 (LMP Unknown)  GENERAL: Well-developed, well-nourished female in no acute distress.  HEENT: Normocephalic, atraumatic.   LUNGS: Effort normal HEART: Regular rate  SKIN: Warm, dry and without erythema PSYCH: Normal mood and affect  US Ob Transvaginal  07/23/2015   CLINICAL DATA:  Uncertain LMP.  Inconclusive viability.  EXAM: TRANSVAGINAL OB ULTRASOUND  TECHNIQUE: Transvaginal ultrasound was performed for complete evaluation of the gestation as well as the maternal uterus, adnexal regions, and pelvic cul-de-sac.  COMPARISON:  07/12/2015  FINDINGS: Intrauterine gestational sac: Visualized/normal in shape.  Yolk sac:  Present  Embryo:  Present  Cardiac Activity: Present  Heart Rate: 108 bpm  CRL:   4.1  mm   6 w 1 d                  [place] EDC: 03/16/2016  Maternal uterus/adnexae: Ovaries have a normal appearance. Small subchorionic hemorrhage is present. Trace free pelvic fluid is present.  IMPRESSION: 1. Single living intrauterine embryo. 2. EDC by today's exam is 03/16/2016. 3. Small subchorionic hemorrhage present.   Electronically Signed   By: Norva Pavlov M.D.   On: 07/23/2015 14:29   MDM Patient presents with partner. She had recent treatment of Chlamydia. At last visit CNM gave Rx for expedited partner treatment, but Rx was incomplete and unable to be filled.  New Rx for Zithromax 1 G PO once given to partner. Confirmed no allergies. Instructions for expedited partner treatment given at last visit.   A: [facility]UP at [redacted]w[redacted]d with normal cardiac activity  P: Discharge home First trimester warning signs discussed Pregnancy confirmation letter and list of area OB providers given Patient advised to start prenatal care with provider of choice Patient may return to MAU as needed or if her condition were to change or  worsen   Marny Lowenstein, PA-C  07/23/2015 2:46 PM

## 2015-07-23 NOTE — Discharge Instructions (Signed)
First Trimester of Pregnancy The first trimester of pregnancy is from week 1 until the end of week 12 (months 1 through 3). During this time, your baby will begin to develop inside you. At 6-8 weeks, the eyes and face are formed, and the heartbeat can be seen on ultrasound. At the end of 12 weeks, all the baby's organs are formed. Prenatal care is all the medical care you receive before the birth of your baby. Make sure you get good prenatal care and follow all of your doctor's instructions. HOME CARE  Medicines  Take medicine only as told by your doctor. Some medicines are safe and some are not during pregnancy.  Take your prenatal vitamins as told by your doctor.  Take medicine that helps you poop (stool softener) as needed if your doctor says it is okay. Diet  Eat regular, healthy meals.  Your doctor will tell you the amount of weight gain that is right for you.  Avoid raw meat and uncooked cheese.  If you feel sick to your stomach (nauseous) or throw up (vomit):  Eat 4 or 5 small meals a day instead of 3 large meals.  Try eating a few soda crackers.  Drink liquids between meals instead of during meals.  If you have a hard time pooping (constipation):  Eat high-fiber foods like fresh vegetables, fruit, and whole grains.  Drink enough fluids to keep your pee (urine) clear or pale yellow. Activity and Exercise  Exercise only as told by your doctor. Stop exercising if you have cramps or pain in your lower belly (abdomen) or low back.  Try to avoid standing for long periods of time. Move your legs often if you must stand in one place for a long time.  Avoid heavy lifting.  Wear low-heeled shoes. Sit and stand up straight.  You can have sex unless your doctor tells you not to. Relief of Pain or Discomfort  Wear a good support bra if your breasts are sore.  Take warm water baths (sitz baths) to soothe pain or discomfort caused by hemorrhoids. Use hemorrhoid cream if your  doctor says it is okay.  Rest with your legs raised if you have leg cramps or low back pain.  Wear support hose if you have puffy, bulging veins (varicose veins) in your legs. Raise (elevate) your feet for 15 minutes, 3-4 times a day. Limit salt in your diet. Prenatal Care  Schedule your prenatal visits by the twelfth week of pregnancy.  Write down your questions. Take them to your prenatal visits.  Keep all your prenatal visits as told by your doctor. Safety  Wear your seat belt at all times when driving.  Make a list of emergency phone numbers. The list should include numbers for family, friends, the hospital, and police and fire departments. General Tips  Ask your doctor for a referral to a local prenatal class. Begin classes no later than at the start of month 6 of your pregnancy.  Ask for help if you need counseling or help with nutrition. Your doctor can give you advice or tell you where to go for help.  Do not use hot tubs, steam rooms, or saunas.  Do not douche or use tampons or scented sanitary pads.  Do not cross your legs for long periods of time.  Avoid litter boxes and soil used by cats.  Avoid all smoking, herbs, and alcohol. Avoid drugs not approved by your doctor.  Visit your dentist. At home, brush your teeth   with a soft toothbrush. Be gentle when you floss. GET HELP IF:  You are dizzy.  You have mild cramps or pressure in your lower belly.  You have a nagging pain in your belly area.  You continue to feel sick to your stomach, throw up, or have watery poop (diarrhea).  You have a bad smelling fluid coming from your vagina.  You have pain with peeing (urination).  You have increased puffiness (swelling) in your face, hands, legs, or ankles. GET HELP RIGHT AWAY IF:   You have a fever.  You are leaking fluid from your vagina.  You have spotting or bleeding from your vagina.  You have very bad belly cramping or pain.  You gain or lose weight  rapidly.  You throw up blood. It may look like coffee grounds.  You are around people who have German measles, fifth disease, or chickenpox.  You have a very bad headache.  You have shortness of breath.  You have any kind of trauma, such as from a fall or a car accident. Document Released: 04/03/2008 Document Revised: 03/02/2014 Document Reviewed: 08/26/2013 Exit[employer] Patient Information 2015 [employer]. This information is not intended to replace advice given to you by your health care provider. Make sure you discuss any questions you have with your health care provider.  

## 2015-10-31 NOTE — L&D Delivery Note (Signed)
Delivery Note Pt progressed quickly to complete dilation and with lots of coaching pushed about 30 minutes and at 2:11 PM a healthy female was delivered via Vaginal, Spontaneous Delivery (Presentation vertex, OA:).  APGAR:8 ,9 ; weight pending.   Placenta status: delivered spontaneously .  Cord:  with the following complications:none .   Anesthesia: Other  Episiotomy:  none Lacerations:small abrasions, hemostatic   Suture Repair: n/a Est. Blood Loss (mL):  225 ml  Mom to postpartum.  Baby to Couplet care / Skin to Skin.  Oliver PilaICHARDSON,Desa Rech W 03/06/2016, 2:27 PM

## 2016-02-14 ENCOUNTER — Encounter (HOSPITAL_COMMUNITY): Payer: Self-pay | Admitting: *Deleted

## 2016-02-14 ENCOUNTER — Inpatient Hospital Stay (HOSPITAL_COMMUNITY)
Admission: AD | Admit: 2016-02-14 | Discharge: 2016-02-14 | Disposition: A | Discharge status: Home / Self Care | Payer: Medicaid Other | Source: Ambulatory Visit | Attending: Obstetrics and Gynecology | Admitting: Obstetrics and Gynecology

## 2016-02-14 DIAGNOSIS — Z3A00 Weeks of gestation of pregnancy not specified: Secondary | ICD-10-CM | POA: Insufficient documentation

## 2016-02-14 DIAGNOSIS — O9989 Other specified diseases and conditions complicating pregnancy, childbirth and the puerperium: Secondary | ICD-10-CM | POA: Diagnosis not present

## 2016-02-14 DIAGNOSIS — Z3A35 35 weeks gestation of pregnancy: Secondary | ICD-10-CM

## 2016-02-14 DIAGNOSIS — N898 Other specified noninflammatory disorders of vagina: Secondary | ICD-10-CM | POA: Diagnosis not present

## 2016-02-14 DIAGNOSIS — O26899 Other specified pregnancy related conditions, unspecified trimester: Secondary | ICD-10-CM | POA: Diagnosis present

## 2016-02-14 LAB — WET PREP, GENITAL
Clue Cells Wet Prep HPF POC: NONE SEEN
SPERM: NONE SEEN
Trich, Wet Prep: NONE SEEN
Yeast Wet Prep HPF POC: NONE SEEN

## 2016-02-14 LAB — URINE MICROSCOPIC-ADD ON: RBC / HPF: NONE SEEN RBC/hpf (ref 0–5)

## 2016-02-14 LAB — URINALYSIS, ROUTINE W REFLEX MICROSCOPIC
BILIRUBIN URINE: NEGATIVE
Glucose, UA: NEGATIVE mg/dL
Hgb urine dipstick: NEGATIVE
KETONES UR: 15 mg/dL — AB
NITRITE: NEGATIVE
PROTEIN: 30 mg/dL — AB
Specific Gravity, Urine: >1.03 — ABNORMAL HIGH (ref 1.005–1.030)
pH: 6 (ref 5.0–8.0)

## 2016-02-14 LAB — POCT FERN TEST: POCT Fern Test: NEGATIVE

## 2016-02-14 NOTE — MAU Provider Note (Signed)
S:  [name] is a 30 y.o. female presenting to MAU with concerns regarding leaking of fluid. She started feeling wetness in her pants today around 11 am. She denies a gush of fluid. She denies vaginal bleeding.   + fetal movement.   Denies dysuria.    O:  GENERAL: Well-developed, well-nourished female in no acute distress.  LUNGS: Effort normal SKIN: Warm, dry and without erythema PSYCH: Normal mood and affect  Filed Vitals:   02/14/16 1223  BP: 111/72  Pulse: 97  Temp: 98.2 F (36.8 C)  Resp: 16   Results for orders placed or performed during the hospital encounter of 02/14/16 (from the past 48 hour(s))  Urinalysis, Routine w reflex microscopic [facility])     Status: Abnormal   Collection Time: 02/14/16 12:12 PM  Result Value Ref Range   Color, Urine ORANGE (A) YELLOW    Comment: BIOCHEMICALS MAY BE AFFECTED BY COLOR   APPearance HAZY (A) CLEAR   Specific Gravity, Urine >1.030 (H) 1.005 - 1.030   pH 6.0 5.0 - 8.0   Glucose, UA NEGATIVE NEGATIVE mg/dL   Hgb urine dipstick NEGATIVE NEGATIVE   Bilirubin Urine NEGATIVE NEGATIVE   Ketones, ur 15 (A) NEGATIVE mg/dL   Protein, ur 30 (A) NEGATIVE mg/dL   Nitrite NEGATIVE NEGATIVE   Leukocytes, UA SMALL (A) NEGATIVE  Urine microscopic-add on     Status: Abnormal   Collection Time: 02/14/16 12:12 PM  Result Value Ref Range   Squamous Epithelial / LPF 6-30 (A) NONE SEEN   WBC, UA 0-5 0 - 5 WBC/hpf   RBC / HPF NONE SEEN 0 - 5 RBC/hpf   Bacteria, UA MANY (A) NONE SEEN  Fern Test     Status: None   Collection Time: 02/14/16 12:41 PM  Result Value Ref Range   POCT Fern Test Negative = intact amniotic membranes   Wet prep, genital     Status: Abnormal   Collection Time: 02/14/16  1:30 PM  Result Value Ref Range   Yeast Wet Prep HPF POC NONE SEEN NONE SEEN   Trich, Wet Prep NONE SEEN NONE SEEN   Clue Cells Wet Prep HPF POC NONE SEEN NONE SEEN   WBC, Wet Prep HPF POC MANY (A) NONE SEEN    Comment: MODERATE  BACTERIA SEEN   Sperm NONE SEEN     MDM:  Fern slide negative Wet prep collected  Discussed patient with Dr. Ellyn HackBovard.  Urine culture sent- pending   Fetal Tracing: Baseline: 140 bpm  Variability: moderate  Accelerations: 15x15 Decelerations: none Toco: one contraction noted     A:  1. Vaginal leukorrhea     P:  Discharge home in stable condition Return to MAU if symptoms worsen  Kick counts  Preterm labor precautions.  Follow up with OB as scheduled    Duane LopeJennifer I Zamora Colton, NP 02/14/2016 4:47 PM

## 2016-02-15 LAB — URINE CULTURE
Culture: >=100000 — AB
SPECIAL REQUESTS: NORMAL

## 2016-02-16 ENCOUNTER — Telehealth: Payer: Self-pay | Admitting: Student

## 2016-02-16 DIAGNOSIS — N3000 Acute cystitis without hematuria: Secondary | ICD-10-CM

## 2016-02-16 MED ORDER — AMOXICILLIN 500 MG PO CAPS: 500.0000 mg | ORAL_CAPSULE | Freq: Three times a day (TID) | ORAL | Status: DC

## 2016-02-16 NOTE — Telephone Encounter (Signed)
Rx changed due to urine culture results. Med sent electronically to pharmacy. Attempted to call patient, no answer, voicemail box not set up yet.

## 2016-02-16 NOTE — Telephone Encounter (Signed)
Pt returned phone call. Has appt with [place] [facility] tomorrow. States they called her in amoxicillin already.

## 2016-03-03 ENCOUNTER — Inpatient Hospital Stay (HOSPITAL_COMMUNITY)
Admission: AD | Admit: 2016-03-03 | Discharge: 2016-03-04 | Disposition: A | Discharge status: Home / Self Care | Payer: Medicaid Other | Source: Ambulatory Visit | Attending: Obstetrics and Gynecology | Admitting: Obstetrics and Gynecology

## 2016-03-03 ENCOUNTER — Encounter (HOSPITAL_COMMUNITY): Payer: Self-pay

## 2016-03-03 ENCOUNTER — Inpatient Hospital Stay (HOSPITAL_COMMUNITY)
Admission: AD | Admit: 2016-03-03 | Discharge: 2016-03-03 | Disposition: A | Discharge status: Home / Self Care | Payer: Medicaid Other | Source: Ambulatory Visit | Attending: Obstetrics and Gynecology | Admitting: Obstetrics and Gynecology

## 2016-03-03 DIAGNOSIS — Z3A38 38 weeks gestation of pregnancy: Secondary | ICD-10-CM | POA: Insufficient documentation

## 2016-03-03 NOTE — MAU Note (Signed)
Contacted Dr. Mindi SlickerBanga cervix unchanged received order to d/c home

## 2016-03-03 NOTE — Discharge Instructions (Signed)
Fetal Movement Counts °Patient Name: __________________________________________________ Patient Due Date: ____________________ °Performing a fetal movement count is highly recommended in high-risk pregnancies, but it is good for every pregnant woman to do. Your health care provider may ask you to start counting fetal movements at 28 weeks of the pregnancy. Fetal movements often increase: °· After eating a full meal. °· After physical activity. °· After eating or drinking something sweet or cold. °· At rest. °Pay attention to when you feel the baby is most active. This will help you notice a pattern of your baby's sleep and wake cycles and what factors contribute to an increase in fetal movement. It is important to perform a fetal movement count at the same time each day when your baby is normally most active.  °HOW TO COUNT FETAL MOVEMENTS °1. Find a quiet and comfortable area to sit or lie down on your left side. Lying on your left side provides the best blood and oxygen circulation to your baby. °2. Write down the day and time on a sheet of paper or in a journal. °3. Start counting kicks, flutters, swishes, rolls, or jabs in a 2-hour period. You should feel at least 10 movements within 2 hours. °4. If you do not feel 10 movements in 2 hours, wait 2-3 hours and count again. Look for a change in the pattern or not enough counts in 2 hours. °SEEK MEDICAL CARE IF: °· You feel less than 10 counts in 2 hours, tried twice. °· There is no movement in over an hour. °· The pattern is changing or taking longer each day to reach 10 counts in 2 hours. °· You feel the baby is not moving as he or she usually does. °Date: ____________ Movements: ____________ Start time: ____________ Finish time: ____________  °Date: ____________ Movements: ____________ Start time: ____________ Finish time: ____________ °Date: ____________ Movements: ____________ Start time: ____________ Finish time: ____________ °Date: ____________ Movements:  ____________ Start time: ____________ Finish time: ____________ °Date: ____________ Movements: ____________ Start time: ____________ Finish time: ____________ °Date: ____________ Movements: ____________ Start time: ____________ Finish time: ____________ °Date: ____________ Movements: ____________ Start time: ____________ Finish time: ____________ °Date: ____________ Movements: ____________ Start time: ____________ Finish time: ____________  °Date: ____________ Movements: ____________ Start time: ____________ Finish time: ____________ °Date: ____________ Movements: ____________ Start time: ____________ Finish time: ____________ °Date: ____________ Movements: ____________ Start time: ____________ Finish time: ____________ °Date: ____________ Movements: ____________ Start time: ____________ Finish time: ____________ °Date: ____________ Movements: ____________ Start time: ____________ Finish time: ____________ °Date: ____________ Movements: ____________ Start time: ____________ Finish time: ____________ °Date: ____________ Movements: ____________ Start time: ____________ Finish time: ____________  °Date: ____________ Movements: ____________ Start time: ____________ Finish time: ____________ °Date: ____________ Movements: ____________ Start time: ____________ Finish time: ____________ °Date: ____________ Movements: ____________ Start time: ____________ Finish time: ____________ °Date: ____________ Movements: ____________ Start time: ____________ Finish time: ____________ °Date: ____________ Movements: ____________ Start time: ____________ Finish time: ____________ °Date: ____________ Movements: ____________ Start time: ____________ Finish time: ____________ °Date: ____________ Movements: ____________ Start time: ____________ Finish time: ____________  °Date: ____________ Movements: ____________ Start time: ____________ Finish time: ____________ °Date: ____________ Movements: ____________ Start time: ____________ Finish  time: ____________ °Date: ____________ Movements: ____________ Start time: ____________ Finish time: ____________ °Date: ____________ Movements: ____________ Start time: ____________ Finish time: ____________ °Date: ____________ Movements: ____________ Start time: ____________ Finish time: ____________ °Date: ____________ Movements: ____________ Start time: ____________ Finish time: ____________ °Date: ____________ Movements: ____________ Start time: ____________ Finish time: ____________  °Date: ____________ Movements: ____________ Start time: ____________ Finish   time: ____________ °Date: ____________ Movements: ____________ Start time: ____________ Finish time: ____________ °Date: ____________ Movements: ____________ Start time: ____________ Finish time: ____________ °Date: ____________ Movements: ____________ Start time: ____________ Finish time: ____________ °Date: ____________ Movements: ____________ Start time: ____________ Finish time: ____________ °Date: ____________ Movements: ____________ Start time: ____________ Finish time: ____________ °Date: ____________ Movements: ____________ Start time: ____________ Finish time: ____________  °Date: ____________ Movements: ____________ Start time: ____________ Finish time: ____________ °Date: ____________ Movements: ____________ Start time: ____________ Finish time: ____________ °Date: ____________ Movements: ____________ Start time: ____________ Finish time: ____________ °Date: ____________ Movements: ____________ Start time: ____________ Finish time: ____________ °Date: ____________ Movements: ____________ Start time: ____________ Finish time: ____________ °Date: ____________ Movements: ____________ Start time: ____________ Finish time: ____________ °Date: ____________ Movements: ____________ Start time: ____________ Finish time: ____________  °Date: ____________ Movements: ____________ Start time: ____________ Finish time: ____________ °Date: ____________  Movements: ____________ Start time: ____________ Finish time: ____________ °Date: ____________ Movements: ____________ Start time: ____________ Finish time: ____________ °Date: ____________ Movements: ____________ Start time: ____________ Finish time: ____________ °Date: ____________ Movements: ____________ Start time: ____________ Finish time: ____________ °Date: ____________ Movements: ____________ Start time: ____________ Finish time: ____________ °Date: ____________ Movements: ____________ Start time: ____________ Finish time: ____________  °Date: ____________ Movements: ____________ Start time: ____________ Finish time: ____________ °Date: ____________ Movements: ____________ Start time: ____________ Finish time: ____________ °Date: ____________ Movements: ____________ Start time: ____________ Finish time: ____________ °Date: ____________ Movements: ____________ Start time: ____________ Finish time: ____________ °Date: ____________ Movements: ____________ Start time: ____________ Finish time: ____________ °Date: ____________ Movements: ____________ Start time: ____________ Finish time: ____________ °  °This information is not intended to replace advice given to you by your health care provider. Make sure you discuss any questions you have with your health care provider. °  °Document Released: 11/15/2006 Document Revised: 11/06/2014 Document Reviewed: 08/12/2012 °[employer] Interactive Patient Education ©2016 [employer] °Vaginal Delivery °During delivery, your health care provider will help you give birth to your baby. During a vaginal delivery, you will work to push the baby out of your vagina. However, before you can push your baby out, a few things need to happen. The opening of your uterus (cervix) has to soften, thin out, and open up (dilate) all the way to 10 cm. Also, your baby has to move down from the uterus into your vagina.  °SIGNS OF LABOR  °Your health care provider will first need to make sure you  are in labor. Signs of labor include:  °· Passing what is called the mucous plug before labor begins. This is a small amount of blood-stained mucus. °· Having regular, painful uterine contractions.   °· The time between contractions gets shorter.   °· The discomfort and pain gradually get more intense. °· Contraction pains get worse when walking and do not go away when resting.   °· Your cervix becomes thinner (effacement) and dilates. °BEFORE THE DELIVERY °Once you are in labor and admitted into the hospital or care center, your health care provider may do the following:  °5. Perform a complete physical exam. °6. Review any complications related to pregnancy or labor.  °7. Check your blood pressure, pulse, temperature, and heart rate (vital signs).   °8. Determine if, and when, the rupture of amniotic membranes occurred. °9. Do a vaginal exam (using a sterile glove and lubricant) to determine:   °1. The position (presentation) of the baby. Is the baby's head presenting first (vertex) in the birth canal (vagina), or are the feet or buttocks first (breech)?   °2. The level (station) of the baby's head within   the birth canal.   °3. The effacement and dilatation of the cervix.   °10. An electronic fetal monitor is usually placed on your abdomen when you first arrive. This is used to monitor your contractions and the baby's heart rate. °1. When the monitor is on your abdomen (external fetal monitor), it can only pick up the frequency and length of your contractions. It cannot tell the strength of your contractions. °2. If it becomes necessary for your health care provider to know exactly how strong your contractions are or to see exactly what the baby's heart rate is doing, an internal monitor may be inserted into your vagina and uterus. Your health care provider will discuss the benefits and risks of using an internal monitor and obtain your permission before inserting the device. °3. Continuous fetal monitoring may be  needed if you have an epidural, are receiving certain medicines (such as oxytocin), or have pregnancy or labor complications. °11. An IV access tube may be placed into a vein in your arm to deliver fluids and medicines if necessary. °THREE STAGES OF LABOR AND DELIVERY °Normal labor and delivery is divided into three stages. °First Stage °This stage starts when you begin to contract regularly and your cervix begins to efface and dilate. It ends when your cervix is completely open (fully dilated). The first stage is the longest stage of labor and can last from 3 hours to 15 hours.  °Several methods are available to help with labor pain. You and your health care provider will decide which option is best for you. Options include:  °· Opioid medicines. These are strong pain medicines that you can get through your IV tube or as a shot into your muscle. These medicines lessen pain but do not make it go away completely.  °· Epidural. A medicine is given through a thin tube that is inserted in your back. The medicine numbs the lower part of your body and prevents any pain in that area. °· Paracervical pain medicine. This is an injection of an anesthetic on each side of your cervix.   °· You may request natural childbirth, which does not involve the use of pain medicines or an epidural during labor and delivery. Instead, you will use other things, such as breathing exercises, to help cope with the pain. °Second Stage °The second stage of labor begins when your cervix is fully dilated at 10 cm. It continues until you push your baby down through the birth canal and the baby is born. This stage can take only minutes or several hours. °· The location of your baby's head as it moves through the birth canal is reported as a number called a station. If the baby's head has not started its descent, the station is described as being at minus 3 (-3). When your baby's head is at the zero station, it is at the middle of the birth canal  and is engaged in the pelvis. The station of your baby helps indicate the progress of the second stage of labor. °· When your baby is born, your health care provider may hold the baby with his or her head lowered to prevent amniotic fluid, mucus, and blood from getting into the baby's lungs. The baby's mouth and nose may be suctioned with a small bulb syringe to remove any additional fluid. °· Your health care provider may then place the baby on your stomach. It is important to keep the baby from getting cold. To do this, the health care provider will dry   the baby off, place the baby directly on your skin (with no blankets between you and the baby), and cover the baby with warm, dry blankets.   °· The umbilical cord is cut. °Third Stage °During the third stage of labor, your health care provider will deliver the placenta (afterbirth) and make sure your bleeding is under control. The delivery of the placenta usually takes about 5 minutes but can take up to 30 minutes. After the placenta is delivered, a medicine may be given either by IV or injection to help contract the uterus and control bleeding. If you are planning to breastfeed, you can try to do so now. °After you deliver the placenta, your uterus should contract and get very firm. If your uterus does not remain firm, your health care provider will massage it. This is important because the contraction of the uterus helps cut off bleeding at the site where the placenta was attached to your uterus. If your uterus does not contract properly and stay firm, you may continue to bleed heavily. If there is a lot of bleeding, medicines may be given to contract the uterus and stop the bleeding.  °  °This information is not intended to replace advice given to you by your health care provider. Make sure you discuss any questions you have with your health care provider. °  °Document Released: 07/25/2008 Document Revised: 11/06/2014 Document Reviewed: 06/12/2012 °[employer]  Interactive Patient Education ©2016 [employer] ° °

## 2016-03-03 NOTE — MAU Note (Signed)
Was here few hours ago and was 2cm. Contractions stronger and closer. Denies LOF or bleeding

## 2016-03-03 NOTE — MAU Note (Signed)
Contractions since last night 

## 2016-03-03 NOTE — MAU Note (Signed)
Notified Dr. Mindi SlickerBanga patient [id number], patient presents with contractions 2/50 posterior, per MD watch patient for 1 to 2 hours and recheck cervix.

## 2016-03-04 NOTE — Progress Notes (Signed)
Gave pt option per Dr Mindi SlickerBanga to be d/c home or walk another hour and be rechecked.  Pt states she lives close by and prefers to go home.

## 2016-03-04 NOTE — Discharge Instructions (Signed)
Third Trimester of Pregnancy °The third trimester is from week 29 through week 42, months 7 through 9. The third trimester is a time when the fetus is growing rapidly. At the end of the ninth month, the fetus is about 20 inches in length and weighs 6-10 pounds.  °BODY CHANGES °Your body goes through many changes during pregnancy. The changes vary from woman to woman.  °· Your weight will continue to increase. You can expect to gain 25-35 pounds (11-16 kg) by the end of the pregnancy. °· You may begin to get stretch marks on your hips, abdomen, and breasts. °· You may urinate more often because the fetus is moving lower into your pelvis and pressing on your bladder. °· You may develop or continue to have heartburn as a result of your pregnancy. °· You may develop constipation because certain hormones are causing the muscles that push waste through your intestines to slow down. °· You may develop hemorrhoids or swollen, bulging veins (varicose veins). °· You may have pelvic pain because of the weight gain and pregnancy hormones relaxing your joints between the bones in your pelvis. Backaches may result from overexertion of the muscles supporting your posture. °· You may have changes in your hair. These can include thickening of your hair, rapid growth, and changes in texture. Some women also have hair loss during or after pregnancy, or hair that feels dry or thin. Your hair will most likely return to normal after your baby is born. °· Your breasts will continue to grow and be tender. A yellow discharge may leak from your breasts called colostrum. °· Your belly button may stick out. °· You may feel short of breath because of your expanding uterus. °· You may notice the fetus "dropping," or moving lower in your abdomen. °· You may have a bloody mucus discharge. This usually occurs a few days to a week before labor begins. °· Your cervix becomes thin and soft (effaced) near your due date. °WHAT TO EXPECT AT YOUR PRENATAL  EXAMS  °You will have prenatal exams every 2 weeks until week 36. Then, you will have weekly prenatal exams. During a routine prenatal visit: °· You will be weighed to make sure you and the fetus are growing normally. °· Your blood pressure is taken. °· Your abdomen will be measured to track your baby's growth. °· The fetal heartbeat will be listened to. °· Any test results from the previous visit will be discussed. °· You may have a cervical check near your due date to see if you have effaced. °At around 36 weeks, your caregiver will check your cervix. At the same time, your caregiver will also perform a test on the secretions of the vaginal tissue. This test is to determine if a type of bacteria, Group B streptococcus, is present. Your caregiver will explain this further. °Your caregiver may ask you: °· What your birth plan is. °· How you are feeling. °· If you are feeling the baby move. °· If you have had any abnormal symptoms, such as leaking fluid, bleeding, severe headaches, or abdominal cramping. °· If you are using any tobacco products, including cigarettes, chewing tobacco, and electronic cigarettes. °· If you have any questions. °Other tests or screenings that may be performed during your third trimester include: °· Blood tests that check for low iron levels (anemia). °· Fetal testing to check the health, activity level, and growth of the fetus. Testing is done if you have certain medical conditions or if   there are problems during the pregnancy. °· HIV (human immunodeficiency virus) testing. If you are at high risk, you may be screened for HIV during your third trimester of pregnancy. °FALSE LABOR °You may feel small, irregular contractions that eventually go away. These are called Braxton Hicks contractions, or false labor. Contractions may last for hours, days, or even weeks before true labor sets in. If contractions come at regular intervals, intensify, or become painful, it is best to be seen by your  caregiver.  °SIGNS OF LABOR  °· Menstrual-like cramps. °· Contractions that are 5 minutes apart or less. °· Contractions that start on the top of the uterus and spread down to the lower abdomen and back. °· A sense of increased pelvic pressure or back pain. °· A watery or bloody mucus discharge that comes from the vagina. °If you have any of these signs before the 37th week of pregnancy, call your caregiver right away. You need to go to the hospital to get checked immediately. °HOME CARE INSTRUCTIONS  °· Avoid all smoking, herbs, alcohol, and unprescribed drugs. These chemicals affect the formation and growth of the baby. °· Do not use any tobacco products, including cigarettes, chewing tobacco, and electronic cigarettes. If you need help quitting, ask your health care provider. You may receive counseling support and other resources to help you quit. °· Follow your caregiver's instructions regarding medicine use. There are medicines that are either safe or unsafe to take during pregnancy. °· Exercise only as directed by your caregiver. Experiencing uterine cramps is a good sign to stop exercising. °· Continue to eat regular, healthy meals. °· Wear a good support bra for breast tenderness. °· Do not use hot tubs, steam rooms, or saunas. °· Wear your seat belt at all times when driving. °· Avoid raw meat, uncooked cheese, cat litter boxes, and soil used by cats. These carry germs that can cause birth defects in the baby. °· Take your prenatal vitamins. °· Take 1500-2000 mg of calcium daily starting at the 20th week of pregnancy until you deliver your baby. °· Try taking a stool softener (if your caregiver approves) if you develop constipation. Eat more high-fiber foods, such as fresh vegetables or fruit and whole grains. Drink plenty of fluids to keep your urine clear or pale yellow. °· Take warm sitz baths to soothe any pain or discomfort caused by hemorrhoids. Use hemorrhoid cream if your caregiver approves. °· If  you develop varicose veins, wear support hose. Elevate your feet for 15 minutes, 3-4 times a day. Limit salt in your diet. °· Avoid heavy lifting, wear low heal shoes, and practice good posture. °· Rest a lot with your legs elevated if you have leg cramps or low back pain. °· Visit your dentist if you have not gone during your pregnancy. Use a soft toothbrush to brush your teeth and be gentle when you floss. °· A sexual relationship may be continued unless your caregiver directs you otherwise. °· Do not travel far distances unless it is absolutely necessary and only with the approval of your caregiver. °· Take prenatal classes to understand, practice, and ask questions about the labor and delivery. °· Make a trial run to the hospital. °· Pack your hospital bag. °· Prepare the baby's nursery. °· Continue to go to all your prenatal visits as directed by your caregiver. °SEEK MEDICAL CARE IF: °· You are unsure if you are in labor or if your water has broken. °· You have dizziness. °· You have   mild pelvic cramps, pelvic pressure, or nagging pain in your abdominal area. °· You have persistent nausea, vomiting, or diarrhea. °· You have a bad smelling vaginal discharge. °· You have pain with urination. °SEEK IMMEDIATE MEDICAL CARE IF:  °· You have a fever. °· You are leaking fluid from your vagina. °· You have spotting or bleeding from your vagina. °· You have severe abdominal cramping or pain. °· You have rapid weight loss or gain. °· You have shortness of breath with chest pain. °· You notice sudden or extreme swelling of your face, hands, ankles, feet, or legs. °· You have not felt your baby move in over an hour. °· You have severe headaches that do not go away with medicine. °· You have vision changes. °  °This information is not intended to replace advice given to you by your health care provider. Make sure you discuss any questions you have with your health care provider. °  °Document Released: 10/10/2001 Document  Revised: 11/06/2014 Document Reviewed: 12/17/2012 °[employer]vier Interactive Patient Education ©2016 [employer] °Fetal Movement Counts °Patient Name: __________________________________________________ Patient Due Date: ____________________ °Performing a fetal movement count is highly recommended in high-risk pregnancies, but it is good for every pregnant woman to do. Your health care provider may ask you to start counting fetal movements at 28 weeks of the pregnancy. Fetal movements often increase: °· After eating a full meal. °· After physical activity. °· After eating or drinking something sweet or cold. °· At rest. °Pay attention to when you feel the baby is most active. This will help you notice a pattern of your baby's sleep and wake cycles and what factors contribute to an increase in fetal movement. It is important to perform a fetal movement count at the same time each day when your baby is normally most active.  °HOW TO COUNT FETAL MOVEMENTS °· Find a quiet and comfortable area to sit or lie down on your left side. Lying on your left side provides the best blood and oxygen circulation to your baby. °· Write down the day and time on a sheet of paper or in a journal. °· Start counting kicks, flutters, swishes, rolls, or jabs in a 2-hour period. You should feel at least 10 movements within 2 hours. °· If you do not feel 10 movements in 2 hours, wait 2-3 hours and count again. Look for a change in the pattern or not enough counts in 2 hours. °SEEK MEDICAL CARE IF: °· You feel less than 10 counts in 2 hours, tried twice. °· There is no movement in over an hour. °· The pattern is changing or taking longer each day to reach 10 counts in 2 hours. °· You feel the baby is not moving as he or she usually does. °Date: ____________ Movements: ____________ Start time: ____________ Finish time: ____________  °Date: ____________ Movements: ____________ Start time: ____________ Finish time: ____________ °Date: ____________  Movements: ____________ Start time: ____________ Finish time: ____________ °Date: ____________ Movements: ____________ Start time: ____________ Finish time: ____________ °Date: ____________ Movements: ____________ Start time: ____________ Finish time: ____________ °Date: ____________ Movements: ____________ Start time: ____________ Finish time: ____________ °Date: ____________ Movements: ____________ Start time: ____________ Finish time: ____________ °Date: ____________ Movements: ____________ Start time: ____________ Finish time: ____________  °Date: ____________ Movements: ____________ Start time: ____________ Finish time: ____________ °Date: ____________ Movements: ____________ Start time: ____________ Finish time: ____________ °Date: ____________ Movements: ____________ Start time: ____________ Finish time: ____________ °Date: ____________ Movements: ____________ Start time: ____________ Finish time: ____________ °Date:   ____________ Movements: ____________ Start time: ____________ Finish time: ____________ °Date: ____________ Movements: ____________ Start time: ____________ Finish time: ____________ °Date: ____________ Movements: ____________ Start time: ____________ Finish time: ____________  °Date: ____________ Movements: ____________ Start time: ____________ Finish time: ____________ °Date: ____________ Movements: ____________ Start time: ____________ Finish time: ____________ °Date: ____________ Movements: ____________ Start time: ____________ Finish time: ____________ °Date: ____________ Movements: ____________ Start time: ____________ Finish time: ____________ °Date: ____________ Movements: ____________ Start time: ____________ Finish time: ____________ °Date: ____________ Movements: ____________ Start time: ____________ Finish time: ____________ °Date: ____________ Movements: ____________ Start time: ____________ Finish time: ____________  °Date: ____________ Movements: ____________ Start time:  ____________ Finish time: ____________ °Date: ____________ Movements: ____________ Start time: ____________ Finish time: ____________ °Date: ____________ Movements: ____________ Start time: ____________ Finish time: ____________ °Date: ____________ Movements: ____________ Start time: ____________ Finish time: ____________ °Date: ____________ Movements: ____________ Start time: ____________ Finish time: ____________ °Date: ____________ Movements: ____________ Start time: ____________ Finish time: ____________ °Date: ____________ Movements: ____________ Start time: ____________ Finish time: ____________  °Date: ____________ Movements: ____________ Start time: ____________ Finish time: ____________ °Date: ____________ Movements: ____________ Start time: ____________ Finish time: ____________ °Date: ____________ Movements: ____________ Start time: ____________ Finish time: ____________ °Date: ____________ Movements: ____________ Start time: ____________ Finish time: ____________ °Date: ____________ Movements: ____________ Start time: ____________ Finish time: ____________ °Date: ____________ Movements: ____________ Start time: ____________ Finish time: ____________ °Date: ____________ Movements: ____________ Start time: ____________ Finish time: ____________  °Date: ____________ Movements: ____________ Start time: ____________ Finish time: ____________ °Date: ____________ Movements: ____________ Start time: ____________ Finish time: ____________ °Date: ____________ Movements: ____________ Start time: ____________ Finish time: ____________ °Date: ____________ Movements: ____________ Start time: ____________ Finish time: ____________ °Date: ____________ Movements: ____________ Start time: ____________ Finish time: ____________ °Date: ____________ Movements: ____________ Start time: ____________ Finish time: ____________ °Date: ____________ Movements: ____________ Start time: ____________ Finish time: ____________  °Date:  ____________ Movements: ____________ Start time: ____________ Finish time: ____________ °Date: ____________ Movements: ____________ Start time: ____________ Finish time: ____________ °Date: ____________ Movements: ____________ Start time: ____________ Finish time: ____________ °Date: ____________ Movements: ____________ Start time: ____________ Finish time: ____________ °Date: ____________ Movements: ____________ Start time: ____________ Finish time: ____________ °Date: ____________ Movements: ____________ Start time: ____________ Finish time: ____________ °Date: ____________ Movements: ____________ Start time: ____________ Finish time: ____________  °Date: ____________ Movements: ____________ Start time: ____________ Finish time: ____________ °Date: ____________ Movements: ____________ Start time: ____________ Finish time: ____________ °Date: ____________ Movements: ____________ Start time: ____________ Finish time: ____________ °Date: ____________ Movements: ____________ Start time: ____________ Finish time: ____________ °Date: ____________ Movements: ____________ Start time: ____________ Finish time: ____________ °Date: ____________ Movements: ____________ Start time: ____________ Finish time: ____________ °  °This information is not intended to replace advice given to you by your health care provider. Make sure you discuss any questions you have with your health care provider. °  °Document Released: 11/15/2006 Document Revised: 11/06/2014 Document Reviewed: 08/12/2012 °[employer]vier Interactive Patient Education ©2016 [employer] °Braxton Hicks Contractions °Contractions of the uterus can occur throughout pregnancy. Contractions are not always a sign that you are in labor.  °WHAT ARE BRAXTON HICKS CONTRACTIONS?  °Contractions that occur before labor are called Braxton Hicks contractions, or false labor. Toward the end of pregnancy (32-34 weeks), these contractions can develop more often and may become more  forceful. This is not true labor because these contractions do not result in opening (dilatation) and thinning of the cervix. They are sometimes difficult to tell apart from true labor because these contractions can be forceful and people have different pain tolerances. You should   not feel embarrassed if you go to the hospital with false labor. Sometimes, the only way to tell if you are in true labor is for your health care provider to look for changes in the cervix. °If there are no prenatal problems or other health problems associated with the pregnancy, it is completely safe to be sent home with false labor and await the onset of true labor. °HOW CAN YOU TELL THE DIFFERENCE BETWEEN TRUE AND FALSE LABOR? °False Labor °· The contractions of false labor are usually shorter and not as hard as those of true labor.   °· The contractions are usually irregular.   °· The contractions are often felt in the front of the lower abdomen and in the groin.   °· The contractions may go away when you walk around or change positions while lying down.   °· The contractions get weaker and are shorter lasting as time goes on.   °· The contractions do not usually become progressively stronger, regular, and closer together as with true labor.   °True Labor °· Contractions in true labor last 30-70 seconds, become very regular, usually become more intense, and increase in frequency.   °· The contractions do not go away with walking.   °· The discomfort is usually felt in the top of the uterus and spreads to the lower abdomen and low back.   °· True labor can be determined by your health care provider with an exam. This will show that the cervix is dilating and getting thinner.   °WHAT TO REMEMBER °· Keep up with your usual exercises and follow other instructions given by your health care provider.   °· Take medicines as directed by your health care provider.   °· Keep your regular prenatal appointments.   °· Eat and drink lightly if you  think you are going into labor.   °· If Braxton Hicks contractions are making you uncomfortable:   °· Change your position from lying down or resting to walking, or from walking to resting.   °· Sit and rest in a tub of warm water.   °· Drink 2-3 glasses of water. Dehydration may cause these contractions.   °· Do slow and deep breathing several times an hour.   °WHEN SHOULD I SEEK IMMEDIATE MEDICAL CARE? °Seek immediate medical care if: °· Your contractions become stronger, more regular, and closer together.   °· You have fluid leaking or gushing from your vagina.   °· You have a fever.   °· You pass blood-tinged mucus.   °· You have vaginal bleeding.   °· You have continuous abdominal pain.   °· You have low back pain that you never had before.   °· You feel your baby's head pushing down and causing pelvic pressure.   °· Your baby is not moving as much as it used to.   °  °This information is not intended to replace advice given to you by your health care provider. Make sure you discuss any questions you have with your health care provider. °  °Document Released: 10/16/2005 Document Revised: 10/21/2013 Document Reviewed: 07/28/2013 °Elsevier Interactive Patient Education ©2016 [employer] ° °

## 2016-03-06 ENCOUNTER — Encounter (HOSPITAL_COMMUNITY): Payer: Self-pay | Admitting: *Deleted

## 2016-03-06 ENCOUNTER — Inpatient Hospital Stay (HOSPITAL_COMMUNITY)
Admission: AD | Admit: 2016-03-06 | Discharge: 2016-03-08 | DRG: 775 | Disposition: A | Discharge status: Home / Self Care | Payer: Medicaid Other | Source: Ambulatory Visit | Attending: Obstetrics and Gynecology | Admitting: Obstetrics and Gynecology

## 2016-03-06 ENCOUNTER — Inpatient Hospital Stay (HOSPITAL_COMMUNITY): Payer: Medicaid Other | Admitting: Anesthesiology

## 2016-03-06 DIAGNOSIS — Z3A38 38 weeks gestation of pregnancy: Secondary | ICD-10-CM

## 2016-03-06 DIAGNOSIS — Z809 Family history of malignant neoplasm, unspecified: Secondary | ICD-10-CM | POA: Diagnosis not present

## 2016-03-06 DIAGNOSIS — Z8249 Family history of ischemic heart disease and other diseases of the circulatory system: Secondary | ICD-10-CM

## 2016-03-06 DIAGNOSIS — O99824 Streptococcus B carrier state complicating childbirth: Principal | ICD-10-CM | POA: Diagnosis present

## 2016-03-06 DIAGNOSIS — Z87440 Personal history of urinary (tract) infections: Secondary | ICD-10-CM

## 2016-03-06 DIAGNOSIS — Z833 Family history of diabetes mellitus: Secondary | ICD-10-CM

## 2016-03-06 LAB — TYPE AND SCREEN
ABO/RH(D): O POS
ANTIBODY SCREEN: NEGATIVE

## 2016-03-06 LAB — CBC
HEMATOCRIT: 32.3 % — AB (ref 36.0–46.0)
Hemoglobin: 10.2 g/dL — ABNORMAL LOW (ref 12.0–15.0)
MCH: 23.4 pg — ABNORMAL LOW (ref 26.0–34.0)
MCHC: 31.6 g/dL (ref 30.0–36.0)
MCV: 74.1 fL — ABNORMAL LOW (ref 78.0–100.0)
PLATELETS: 322 10*3/uL (ref 150–400)
RBC: 4.36 MIL/uL (ref 3.87–5.11)
RDW: 19 % — AB (ref 11.5–15.5)
WBC: 7 10*3/uL (ref 4.0–10.5)

## 2016-03-06 MED ORDER — LACTATED RINGERS IV SOLN
INTRAVENOUS | Status: DC
  Administered 2016-03-06 (×2): 125 mL/h via INTRAVENOUS

## 2016-03-06 MED ORDER — IBUPROFEN 600 MG PO TABS
600.0000 mg | ORAL_TABLET | Freq: Four times a day (QID) | ORAL | Status: DC
  Administered 2016-03-06 – 2016-03-08 (×7): 600 mg via ORAL
  Filled 2016-03-06 (×7): qty 1

## 2016-03-06 MED ORDER — DIPHENHYDRAMINE HCL 50 MG/ML IJ SOLN: 12.5000 mg | INTRAMUSCULAR | Status: DC | PRN

## 2016-03-06 MED ORDER — PHENYLEPHRINE 40 MCG/ML (10ML) SYRINGE FOR IV PUSH (FOR BLOOD PRESSURE SUPPORT)
80.0000 ug | PREFILLED_SYRINGE | INTRAVENOUS | Status: DC | PRN
  Filled 2016-03-06: qty 5

## 2016-03-06 MED ORDER — OXYCODONE HCL 5 MG PO TABS: 5.0000 mg | ORAL_TABLET | ORAL | Status: DC | PRN

## 2016-03-06 MED ORDER — COCONUT OIL OIL: 1.0000 "application " | TOPICAL_OIL | Status: DC | PRN

## 2016-03-06 MED ORDER — OXYCODONE HCL 5 MG PO TABS
10.0000 mg | ORAL_TABLET | ORAL | Status: DC | PRN
  Filled 2016-03-06: qty 2

## 2016-03-06 MED ORDER — SIMETHICONE 80 MG PO CHEW: 80.0000 mg | CHEWABLE_TABLET | ORAL | Status: DC | PRN

## 2016-03-06 MED ORDER — ONDANSETRON HCL 4 MG PO TABS: 4.0000 mg | ORAL_TABLET | ORAL | Status: DC | PRN

## 2016-03-06 MED ORDER — SODIUM CHLORIDE 0.9 % IV SOLN
2.0000 g | Freq: Once | INTRAVENOUS | Status: AC
  Administered 2016-03-06: 2 g via INTRAVENOUS
  Filled 2016-03-06: qty 2000

## 2016-03-06 MED ORDER — FENTANYL 2.5 MCG/ML BUPIVACAINE 1/10 % EPIDURAL INFUSION (WH - ANES)
14.0000 mL/h | INTRAMUSCULAR | Status: DC | PRN
  Filled 2016-03-06: qty 125

## 2016-03-06 MED ORDER — DIPHENHYDRAMINE HCL 25 MG PO CAPS: 25.0000 mg | ORAL_CAPSULE | Freq: Four times a day (QID) | ORAL | Status: DC | PRN

## 2016-03-06 MED ORDER — BENZOCAINE-MENTHOL 20-0.5 % EX AERO: 1.0000 "application " | INHALATION_SPRAY | CUTANEOUS | Status: DC | PRN

## 2016-03-06 MED ORDER — LACTATED RINGERS IV SOLN: 500.0000 mL | Freq: Once | INTRAVENOUS | Status: DC

## 2016-03-06 MED ORDER — BUTORPHANOL TARTRATE 1 MG/ML IJ SOLN
1.0000 mg | INTRAMUSCULAR | Status: DC | PRN
  Administered 2016-03-06: 1 mg via INTRAVENOUS
  Filled 2016-03-06: qty 1

## 2016-03-06 MED ORDER — ONDANSETRON HCL 4 MG/2ML IJ SOLN: 4.0000 mg | INTRAMUSCULAR | Status: DC | PRN

## 2016-03-06 MED ORDER — ACETAMINOPHEN 325 MG PO TABS: 650.0000 mg | ORAL_TABLET | ORAL | Status: DC | PRN

## 2016-03-06 MED ORDER — LIDOCAINE HCL (PF) 1 % IJ SOLN
30.0000 mL | INTRAMUSCULAR | Status: DC | PRN
  Filled 2016-03-06: qty 30

## 2016-03-06 MED ORDER — WITCH HAZEL-GLYCERIN EX PADS: 1.0000 "application " | MEDICATED_PAD | CUTANEOUS | Status: DC | PRN

## 2016-03-06 MED ORDER — ACETAMINOPHEN 325 MG PO TABS
650.0000 mg | ORAL_TABLET | ORAL | Status: DC | PRN
  Administered 2016-03-07: 650 mg via ORAL
  Filled 2016-03-06 (×2): qty 2

## 2016-03-06 MED ORDER — DIBUCAINE 1 % RE OINT: 1.0000 "application " | TOPICAL_OINTMENT | RECTAL | Status: DC | PRN

## 2016-03-06 MED ORDER — EPHEDRINE 5 MG/ML INJ
10.0000 mg | INTRAVENOUS | Status: DC | PRN
  Filled 2016-03-06: qty 2

## 2016-03-06 MED ORDER — OXYTOCIN BOLUS FROM INFUSION: 500.0000 mL | INTRAVENOUS | Status: DC

## 2016-03-06 MED ORDER — LACTATED RINGERS IV SOLN: 500.0000 mL | INTRAVENOUS | Status: DC | PRN

## 2016-03-06 MED ORDER — ONDANSETRON HCL 4 MG/2ML IJ SOLN: 4.0000 mg | Freq: Four times a day (QID) | INTRAMUSCULAR | Status: DC | PRN

## 2016-03-06 MED ORDER — OXYTOCIN 10 UNIT/ML IJ SOLN
2.5000 [IU]/h | INTRAMUSCULAR | Status: DC
  Administered 2016-03-06: 39.96 [IU]/h via INTRAVENOUS
  Filled 2016-03-06: qty 4

## 2016-03-06 MED ORDER — ZOLPIDEM TARTRATE 5 MG PO TABS: 5.0000 mg | ORAL_TABLET | Freq: Every evening | ORAL | Status: DC | PRN

## 2016-03-06 MED ORDER — SENNOSIDES-DOCUSATE SODIUM 8.6-50 MG PO TABS
2.0000 | ORAL_TABLET | ORAL | Status: DC
  Administered 2016-03-06 – 2016-03-08 (×2): 2 via ORAL
  Filled 2016-03-06 (×2): qty 2

## 2016-03-06 MED ORDER — OXYCODONE-ACETAMINOPHEN 5-325 MG PO TABS: 2.0000 | ORAL_TABLET | ORAL | Status: DC | PRN

## 2016-03-06 MED ORDER — TETANUS-DIPHTH-ACELL PERTUSSIS 5-2.5-18.5 LF-MCG/0.5 IM SUSP: 0.5000 mL | Freq: Once | INTRAMUSCULAR | Status: DC

## 2016-03-06 MED ORDER — CITRIC ACID-SODIUM CITRATE 334-500 MG/5ML PO SOLN: 30.0000 mL | ORAL | Status: DC | PRN

## 2016-03-06 MED ORDER — PHENYLEPHRINE 40 MCG/ML (10ML) SYRINGE FOR IV PUSH (FOR BLOOD PRESSURE SUPPORT)
80.0000 ug | PREFILLED_SYRINGE | INTRAVENOUS | Status: DC | PRN
  Filled 2016-03-06: qty 5
  Filled 2016-03-06: qty 10

## 2016-03-06 MED ORDER — OXYCODONE-ACETAMINOPHEN 5-325 MG PO TABS: 1.0000 | ORAL_TABLET | ORAL | Status: DC | PRN

## 2016-03-06 MED ORDER — PRENATAL MULTIVITAMIN CH
1.0000 | ORAL_TABLET | Freq: Every day | ORAL | Status: DC
  Administered 2016-03-08: 1 via ORAL
  Filled 2016-03-06 (×2): qty 1

## 2016-03-06 NOTE — H&P (Signed)
Margaret Knight is a 30 y.o. female [id number] at 838 5/7 weeks (EDD 03/15/16 by LMP c/Knight 6 week US) presenting for labor.  Pt seen yesterday in [facility]U with cervix 2 cm, reported to office with contractions today and found to be 5+cm so admitted for delivery.  Prenatal care significant for several missed appointments, but overall adequate.  GBS positive.    Maternal Medical History:  Reason for admission: Contractions.   Contractions: Onset was 6-12 hours ago.   Frequency: regular.   Perceived severity is moderate.    Fetal activity: Perceived fetal activity is normal.    Prenatal Complications - Diabetes: none.    OB History    Gravida Para Term Preterm AB TAB SAB Ectopic Multiple Living   10 4 4  0 5 3 2  0 1 4    NSVD x 4 (2006,2008,2013,2015) EAB x 3 SAB x 2  Past Medical History  Diagnosis Date  . Migraines   . History of recurrent UTI (urinary tract infection)   . Normal pregnancy, repeat 03/09/2012  . SVD (spontaneous vaginal delivery) 03/10/2012  . Chlamydia    Past Surgical History  Procedure Laterality Date  . Dilation and curettage of uterus     Family History: family history includes Cancer in her mother; Diabetes in her maternal grandmother and paternal grandmother; Hypertension in her mother. There is no history of Anesthesia problems. Social History:  reports that she has never smoked. She has never used smokeless tobacco. She reports that she does not drink alcohol or use illicit drugs.   Prenatal Transfer Tool  Maternal Diabetes: No Genetic Screening: Normal Maternal Ultrasounds/Referrals: Normal Fetal Ultrasounds or other Referrals:  None Maternal Substance Abuse:  No Significant Maternal Medications:  None Significant Maternal Lab Results:  Lab values include: Group B Strep positive Other Comments:  None  Review of Systems  Gastrointestinal: Positive for abdominal pain.    Dilation: 6 Effacement (%): 70 Station: -2 Exam by:: Dr. Senaida Knight  AROM blood  tinged Blood pressure 119/79, pulse 86, temperature 98.1 F (36.7 C), temperature source Oral, resp. rate 18, height 5\' 7"  (1.702 m), weight 169 lb (76.658 kg), last menstrual period 03/19/2015, SpO2 99 %. Maternal Exam:  Uterine Assessment: Contraction strength is moderate.  Contraction frequency is regular.   Abdomen: Patient reports no abdominal tenderness. Fetal presentation: vertex  Introitus: Normal vulva. Normal vagina.    Physical Exam  Constitutional: She appears well-developed.  Cardiovascular: Normal rate.   Respiratory: Effort normal.  GI: Soft.  Genitourinary: Vagina normal.  Neurological: She is alert.  Psychiatric: She has a normal mood and affect.    Prenatal labs: ABO, Rh: --/--/O POS (05/08 1104) Antibody: NEG (05/08 1104) Rubella:  Immune RPR: Non Reactive (09/12 1841)  HBsAg:   Neg HIV: Non Reactive (09/12 1841)  GBS:   Positive First trimester screen and AFP negative One hour GCT 118 CF negative  Assessment/Plan: Pt admitted and ampicillin on board for + GBS.  Pt declined epidural.  Stadol given.  Follow progress.  Margaret Knight 03/06/2016, 1:09 PM

## 2016-03-06 NOTE — Anesthesia Procedure Notes (Signed)
Epidural Patient location during procedure: OB  Staffing Anesthesiologist: [name], Margaret Knight  Preanesthetic Checklist Completed: patient identified, site marked, surgical consent, pre-op evaluation, timeout performed, IV checked, risks and benefits discussed and monitors and equipment checked  Epidural Patient position: sitting Prep: DuraPrep Patient monitoring: heart rate and blood pressure Approach: midline Location: L4-L5 Injection technique: LOR saline  Needle:  Needle type: Tuohy  Needle gauge: 17 G Needle length: 9 cm  Additional Notes Attempts to find loss of resistance for five minutes. I began to look for new interspace, but [name] decided she wanted me to stop the procedure. I stopped and let her know I would be happy to come back and try again if she desired later.Reason for block:procedure for pain

## 2016-03-06 NOTE — Anesthesia Pain Management Evaluation Note (Signed)
  [facility] Pain Management Visit Note  Patient: Marian SorrowShimika S Ivey, 30 y.o., female  "Hello I am a [facility] the anesthesia [facility] at [facility]. We have an anesthesia team available at all times to provide care throughout the hospital, including epidural management and anesthesia for C-section. I don't know your plan for the delivery whether it a natural birth, water birth, IV sedation, nitrous supplementation, doula or epidural, but we want to meet your pain goals."   1.Was your pain managed to your expectations on prior hospitalizations?     2.What is your expectation for pain management during this hospitalization?     3.How can we help you reach that goal?   Record the patient's initial score and the patient's pain goal.   Pain: 10  Pain Goal: 3 [facility] wants you to be able to say your pain was always managed very well.  Laban EmperorMalinova,Derya Dettmann Hristova 03/06/2016

## 2016-03-06 NOTE — Progress Notes (Signed)
UR chart review completed.  

## 2016-03-06 NOTE — Anesthesia Preprocedure Evaluation (Addendum)
Anesthesia Evaluation  Patient identified by MRN, date of birth, ID band Patient awake    Reviewed: Allergy & Precautions, NPO status , Patient's Chart, lab work & pertinent test results  Airway Mallampati: II  TM Distance: >3 FB Neck ROM: Full    Dental no notable dental hx.    Pulmonary neg pulmonary ROS,    Pulmonary exam normal breath sounds clear to auscultation       Cardiovascular negative cardio ROS Normal cardiovascular exam Rhythm:Regular Rate:Normal     Neuro/Psych  Headaches, negative psych ROS   GI/Hepatic negative GI ROS, Neg liver ROS,   Endo/Other  negative endocrine ROS  Renal/GU Renal disease  negative genitourinary   Musculoskeletal negative musculoskeletal ROS (+)   Abdominal   Peds negative pediatric ROS (+)  Hematology negative hematology ROS (+)   Anesthesia Other Findings   Reproductive/Obstetrics negative OB ROS                             Anesthesia Physical Anesthesia Plan  ASA: II  Anesthesia Plan: Epidural   Post-op Pain Management:    Induction: Intravenous  Airway Management Planned: Natural Airway  Additional Equipment:   Intra-op Plan:   Post-operative Plan:   Informed Consent: I have reviewed the patients History and Physical, chart, labs and discussed the procedure including the risks, benefits and alternatives for the proposed anesthesia with the patient or authorized representative who has indicated his/her understanding and acceptance.     Plan Discussed with: CRNA  Anesthesia Plan Comments: (Informed consent obtained prior to proceeding including risk of failure, 1% risk of PDPH, risk of minor discomfort and bruising.  Discussed rare but serious complications including epidural abscess, permanent nerve injury, epidural hematoma.  Discussed alternatives to epidural analgesia and patient desires to proceed.  Timeout performed  pre-procedure verifying patient name, procedure, and platelet count.  Patient tolerated procedure well. )        Anesthesia Quick Evaluation

## 2016-03-07 LAB — CBC
HEMATOCRIT: 27.6 % — AB (ref 36.0–46.0)
HEMOGLOBIN: 8.6 g/dL — AB (ref 12.0–15.0)
MCH: 23.2 pg — ABNORMAL LOW (ref 26.0–34.0)
MCHC: 31.2 g/dL (ref 30.0–36.0)
MCV: 74.4 fL — ABNORMAL LOW (ref 78.0–100.0)
Platelets: 298 10*3/uL (ref 150–400)
RBC: 3.71 MIL/uL — ABNORMAL LOW (ref 3.87–5.11)
RDW: 18.9 % — ABNORMAL HIGH (ref 11.5–15.5)
WBC: 14.4 10*3/uL — ABNORMAL HIGH (ref 4.0–10.5)

## 2016-03-07 LAB — RPR: RPR Ser Ql: NONREACTIVE

## 2016-03-07 NOTE — Progress Notes (Signed)
I received a referral from Lulu Ridingolleen Shaw, LCSW who stated that pt had had a difficult pregnancy.  This is Margaret Knight's 5th child.  She was holding her and had FOB, as well as another family member, in the room.  She stated no concerns at this time. I inquired about any history of postpartum depression with any previous children and she stated that she had not experienced any.  She stated that the children were excited about having a baby sister.    I did not have an opportunity to speak with her on her own, but I gave her my card for follow-up in case there was something that she wanted to share when she is alone.  Chaplain Dyanne CarrelKaty Markeeta Scalf, Bcc Pager, [phone] 2:38 PM    03/07/16 1400  Clinical Encounter Type  Visited With Patient and family together  Visit Type Initial  Referral From Social work

## 2016-03-07 NOTE — Progress Notes (Signed)
Post Partum Day 1 Subjective: no complaints and tolerating PO  Objective: Blood pressure 114/74, pulse 90, temperature 98.6 F (37 C), temperature source Oral, resp. rate 18, height 5\' 7"  (1.702 m), weight 169 lb (76.658 kg), last menstrual period 03/19/2015, SpO2 100 %, unknown if currently breastfeeding.  Physical Exam:  General: alert and cooperative Lochia: appropriate Uterine Fundus: firm    Recent Labs  03/06/16 1104 03/07/16 0527  HGB 10.2* 8.6*  HCT 32.3* 27.6*    Assessment/Plan: Plan for discharge tomorrow   LOS: 1 day   Armaan Pond W 03/07/2016, 6:43 AM

## 2016-03-07 NOTE — Progress Notes (Signed)
CSW received consult for hx of Depression.  CSW notes that there is not a diagnosis of Depression, rather frustrations noted in pregnancy.  Due to limited staffing at this time, CSW is not available to meet with MOB, however, referred MOB to the Spiritual Care Department for support.  CSW asked that the Chaplain call CSW if she feels there are acute concerns.  CSW is screening out consult at this time.

## 2016-03-08 MED ORDER — IBUPROFEN 600 MG PO TABS: 600.0000 mg | ORAL_TABLET | Freq: Four times a day (QID) | ORAL | Status: DC

## 2016-03-08 MED ORDER — OXYCODONE HCL 5 MG PO TABS: 5.0000 mg | ORAL_TABLET | ORAL | Status: DC | PRN

## 2016-03-08 NOTE — Progress Notes (Signed)
Post Partum Day 1 Subjective: no complaints, up ad lib and tolerating PO  Objective: Blood pressure 105/60, pulse 73, temperature 98.1 F (36.7 C), temperature source Oral, resp. rate 18, height 5\' 7"  (1.702 m), weight 169 lb (76.658 kg), last menstrual period 03/19/2015, SpO2 100 %, unknown if currently breastfeeding.  Physical Exam:  General: alert and cooperative Lochia: appropriate Uterine Fundus: firm    Recent Labs  03/06/16 1104 03/07/16 0527  HGB 10.2* 8.6*  HCT 32.3* 27.6*    Assessment/Plan: Discharge home   LOS: 2 days   Hazle Ogburn W 03/08/2016, 8:33 AM

## 2016-03-08 NOTE — Discharge Summary (Signed)
OB Discharge Summary     Patient Name: Margaret Knight DOB: 1986-08-16 MRN: [id number]  Date of admission: 03/06/2016 Delivering MD: Huel CoteICHARDSON, Amare Kontos   Date of discharge: 03/08/2016  Admitting diagnosis: EARLY LABOR Intrauterine pregnancy: 1560w4d     Secondary diagnosis:  Active Problems:   Indication for care in labor or delivery   NSVD (normal spontaneous vaginal delivery)  Additional problems:     Discharge diagnosis: Term Pregnancy Delivered                                                                                                Post partum procedures:none  Augmentation: AROM  Complications: None  Hospital course:  Onset of Labor With Vaginal Delivery     30 y.o. yo [id number] at 4360w4d was admitted in Active Labor on 03/06/2016. Patient had an uncomplicated labor course as follows:  Membrane Rupture Time/Date: 1:00 PM ,03/06/2016   Intrapartum Procedures: Episiotomy: None [1]                                         Lacerations:  Periurethral [8]  Patient had a delivery of a Viable infant. 03/06/2016  Information for the patient's newborn:  Margaret Knight, Margaret Knight [[id number]][[id number]]       Pateint had an uncomplicated postpartum course.  She is ambulating, tolerating a regular diet, passing flatus, and urinating well. Patient is discharged home in stable condition on 03/08/2016.    Physical exam  Filed Vitals:   03/06/16 2115 03/07/16 0515 03/07/16 1800 03/08/16 0624  BP: 124/66 114/74 115/69 105/60  Pulse: 94 90 86 73  Temp: 99.5 F (37.5 C) 98.6 F (37 C) 98.4 F (36.9 C) 98.1 F (36.7 C)  TempSrc: Oral Oral Oral Oral  Resp: 18 18 18 18   Height:      Weight:      SpO2: 100% 100%     General: alert and cooperative Lochia: appropriate Uterine Fundus: firm  Labs: Lab Results  Component Value Date   WBC 14.4* 03/07/2016   HGB 8.6* 03/07/2016   HCT 27.6* 03/07/2016   MCV 74.4* 03/07/2016   PLT 298 03/07/2016   CMP Latest Ref Rng 12/23/2012  Glucose 70 - 99 mg/dL  324(M102(H)  BUN 6 - 23 mg/dL 7  Creatinine 0.100.50 - 2.721.10 mg/dL 5.360.71  Sodium 644135 - 034145 mEq/L 135  Potassium 3.5 - 5.1 mEq/L 3.9  Chloride 96 - 112 mEq/L 102  CO2 19 - 32 mEq/L 23  Calcium 8.4 - 10.5 mg/dL 9.7  Total Protein 6.0 - 8.3 g/dL 7.4(Q8.5(H)  Total Bilirubin 0.3 - 1.2 mg/dL 0.4  Alkaline Phos 39 - 117 U/L 67  AST 0 - 37 U/L 22  ALT 0 - 35 U/L 21    Discharge instruction: per After Visit Summary and "Baby and Me Booklet".  After visit meds:    Medication List    STOP taking these medications        amoxicillin 500 MG capsule  Commonly known as:  AMOXIL      TAKE these medications        ibuprofen 600 MG tablet  Commonly known as:  ADVIL,MOTRIN  Take 1 tablet (600 mg total) by mouth every 6 (six) hours.     oxyCODONE 5 MG immediate release tablet  Commonly known as:  Oxy IR/ROXICODONE  Take 1 tablet (5 mg total) by mouth every 4 (four) hours as needed (pain scale 4-7).        Diet: routine diet  Activity: Advance as tolerated. Pelvic rest for 6 weeks.   Outpatient follow up:6 weeks Follow up Appt:No future appointments. Follow up Visit:No Follow-up on file.  Postpartum contraception: Undecided  Newborn Data: Live born female  Birth Weight: 6 lb 8.9 oz (2975 g) APGAR: 9, 9  Baby Feeding: Bottle Disposition:home with mother   03/08/2016 Oliver Pila, MD

## 2016-03-10 ENCOUNTER — Inpatient Hospital Stay (HOSPITAL_COMMUNITY): Payer: Medicaid Other

## 2016-10-05 ENCOUNTER — Encounter (HOSPITAL_COMMUNITY): Payer: Self-pay

## 2016-10-05 ENCOUNTER — Inpatient Hospital Stay (HOSPITAL_COMMUNITY): Payer: Self-pay

## 2016-10-05 ENCOUNTER — Inpatient Hospital Stay (HOSPITAL_COMMUNITY)
Admission: AD | Admit: 2016-10-05 | Discharge: 2016-10-05 | Discharge status: Against Medical Advice | Payer: Medicaid Other | Source: Ambulatory Visit | Attending: Obstetrics and Gynecology | Admitting: Obstetrics and Gynecology

## 2016-10-05 ENCOUNTER — Inpatient Hospital Stay (HOSPITAL_COMMUNITY)
Admission: AD | Admit: 2016-10-05 | Discharge: 2016-10-05 | Disposition: A | Discharge status: Home / Self Care | Payer: Medicaid Other | Source: Ambulatory Visit | Attending: Obstetrics and Gynecology | Admitting: Obstetrics and Gynecology

## 2016-10-05 DIAGNOSIS — O209 Hemorrhage in early pregnancy, unspecified: Secondary | ICD-10-CM | POA: Insufficient documentation

## 2016-10-05 DIAGNOSIS — O26891 Other specified pregnancy related conditions, first trimester: Secondary | ICD-10-CM | POA: Diagnosis not present

## 2016-10-05 DIAGNOSIS — Z3A01 Less than 8 weeks gestation of pregnancy: Secondary | ICD-10-CM | POA: Diagnosis not present

## 2016-10-05 DIAGNOSIS — B9689 Other specified bacterial agents as the cause of diseases classified elsewhere: Secondary | ICD-10-CM | POA: Insufficient documentation

## 2016-10-05 DIAGNOSIS — O23591 Infection of other part of genital tract in pregnancy, first trimester: Secondary | ICD-10-CM | POA: Insufficient documentation

## 2016-10-05 DIAGNOSIS — O3680X0 Pregnancy with inconclusive fetal viability, not applicable or unspecified: Secondary | ICD-10-CM

## 2016-10-05 DIAGNOSIS — N946 Dysmenorrhea, unspecified: Secondary | ICD-10-CM | POA: Diagnosis present

## 2016-10-05 LAB — CBC
HEMATOCRIT: 32.7 % — AB (ref 36.0–46.0)
HEMOGLOBIN: 10.6 g/dL — AB (ref 12.0–15.0)
MCH: 26.1 pg (ref 26.0–34.0)
MCHC: 32.4 g/dL (ref 30.0–36.0)
MCV: 80.5 fL (ref 78.0–100.0)
Platelets: 421 10*3/uL — ABNORMAL HIGH (ref 150–400)
RBC: 4.06 MIL/uL (ref 3.87–5.11)
RDW: 16.8 % — ABNORMAL HIGH (ref 11.5–15.5)
WBC: 4.6 10*3/uL (ref 4.0–10.5)

## 2016-10-05 LAB — URINALYSIS, ROUTINE W REFLEX MICROSCOPIC
BILIRUBIN URINE: NEGATIVE
Bacteria, UA: NONE SEEN
Glucose, UA: NEGATIVE mg/dL
KETONES UR: NEGATIVE mg/dL
Nitrite: NEGATIVE
PH: 6 (ref 5.0–8.0)
Protein, ur: NEGATIVE mg/dL
Specific Gravity, Urine: 1.023 (ref 1.005–1.030)

## 2016-10-05 LAB — WET PREP, GENITAL
SPERM: NONE SEEN
Trich, Wet Prep: NONE SEEN
YEAST WET PREP: NONE SEEN

## 2016-10-05 LAB — HCG, QUANTITATIVE, PREGNANCY: hCG, Beta Chain, Quant, S: 123 m[IU]/mL — ABNORMAL HIGH (ref <5)

## 2016-10-05 LAB — POCT PREGNANCY, URINE: Preg Test, Ur: POSITIVE — AB

## 2016-10-05 MED ORDER — METRONIDAZOLE 500 MG PO TABS: 500.0000 mg | ORAL_TABLET | Freq: Two times a day (BID) | ORAL | 0 refills | Status: DC

## 2016-10-05 NOTE — MAU Provider Note (Signed)
History     CSN: [id number]  Arrival date and time: 10/05/16 2039   First Provider Initiated Contact with Patient 10/05/16 2159       No chief complaint on file.  HPI [name] is a 30 y.o. [id number] at 7549w6d who presents to MAU today with complaint of spotting and cramping. She was seen and evaluated here earlier today and had to leave AMA for a work commitment. She has returned for her US that was not completed earlier today. She denies pain at this time.   OB History    Gravida Para Term Preterm AB Living   11 5 5  0 5 5   SAB TAB Ectopic Multiple Live Births   2 3 0 1 5      Past Medical History:  Diagnosis Date  . Chlamydia   . History of recurrent UTI (urinary tract infection)   . Migraines   . Normal pregnancy, repeat 03/09/2012  . SVD (spontaneous vaginal delivery) 03/10/2012    Past Surgical History:  Procedure Laterality Date  . DILATION AND CURETTAGE OF UTERUS      Family History  Problem Relation Age of Onset  . Cancer Mother     breast  . Hypertension Mother   . Diabetes Maternal Grandmother   . Diabetes Paternal Grandmother   . Anesthesia problems Neg Hx     Social History  Substance Use Topics  . Smoking status: Never Smoker  . Smokeless tobacco: Never Used  . Alcohol use No    Allergies: No Known Allergies  No prescriptions prior to admission.    Review of Systems  Gastrointestinal: Negative for abdominal pain.  Genitourinary:       + spotting   Physical Exam   Blood pressure 116/72, pulse 77, temperature 99 F (37.2 C), resp. rate 16, last menstrual period 09/08/2016, unknown if currently breastfeeding.  Physical Exam  Nursing note and vitals reviewed. Constitutional: She is oriented to person, place, and time. She appears well-developed and well-nourished. No distress.  HENT:  Head: Normocephalic and atraumatic.  Cardiovascular: Normal rate.   Respiratory: Effort normal.  GI: Soft. She exhibits no distension.   Neurological: She is alert and oriented to person, place, and time.  Skin: Skin is warm and dry. No erythema.  Psychiatric: She has a normal mood and affect.   Results for orders placed or performed during the hospital encounter of 10/05/16 (from the past 24 hour(s))  Urinalysis, Routine w reflex microscopic     Status: Abnormal   Collection Time: 10/05/16  9:40 AM  Result Value Ref Range   Color, Urine YELLOW YELLOW   APPearance CLEAR CLEAR   Specific Gravity, Urine 1.023 1.005 - 1.030   pH 6.0 5.0 - 8.0   Glucose, UA NEGATIVE NEGATIVE mg/dL   Hgb urine dipstick LARGE (A) NEGATIVE   Bilirubin Urine NEGATIVE NEGATIVE   Ketones, ur NEGATIVE NEGATIVE mg/dL   Protein, ur NEGATIVE NEGATIVE mg/dL   Nitrite NEGATIVE NEGATIVE   Leukocytes, UA TRACE (A) NEGATIVE   RBC / HPF 0-5 0 - 5 RBC/hpf   WBC, UA 0-5 0 - 5 WBC/hpf   Bacteria, UA NONE SEEN NONE SEEN   Squamous Epithelial / LPF 0-5 (A) NONE SEEN   Mucous PRESENT   Pregnancy, urine POC     Status: Abnormal   Collection Time: 10/05/16  9:41 AM  Result Value Ref Range   Preg Test, Ur POSITIVE (A) NEGATIVE  Wet prep, genital  Status: Abnormal   Collection Time: 10/05/16 10:10 AM  Result Value Ref Range   Yeast Wet Prep HPF POC NONE SEEN NONE SEEN   Trich, Wet Prep NONE SEEN NONE SEEN   Clue Cells Wet Prep HPF POC PRESENT (A) NONE SEEN   WBC, Wet Prep HPF POC MANY (A) NONE SEEN   Sperm NONE SEEN   CBC     Status: Abnormal   Collection Time: 10/05/16 10:38 AM  Result Value Ref Range   WBC 4.6 4.0 - 10.5 K/uL   RBC 4.06 3.87 - 5.11 MIL/uL   Hemoglobin 10.6 (L) 12.0 - 15.0 g/dL   HCT 16.132.7 (L) 09.636.0 - 04.546.0 %   MCV 80.5 78.0 - 100.0 fL   MCH 26.1 26.0 - 34.0 pg   MCHC 32.4 30.0 - 36.0 g/dL   RDW 40.916.8 (H) 81.111.5 - 91.415.5 %   Platelets 421 (H) 150 - 400 K/uL  hCG, quantitative, pregnancy     Status: Abnormal   Collection Time: 10/05/16 10:38 AM  Result Value Ref Range   hCG, Beta Chain, Quant, S 123 (H) <5 mIU/mL    MAU Course   Procedures None  MDM Discussed patient and results with Dr. Ellyn HackBovard. Ok for discharge at this time. Will need to return to MAU for repeat labs on Saturday.   Assessment and Plan  A:  Pregnancy of unknown location Bacterial vaginosis  P:  Discharge home Rx for Flagyl sent to patient's pharmacy Discussed hygiene products and probiotics for avoiding re-infection Ectopic precautions discussed Patient advised to follow-up in MAU for repeat labs in 48 hours or sooner if her condition were to change or worsen  Marny LowensteinJulie N Zachrey Deutscher, PA-C  10/05/2016, 10:05 PM

## 2016-10-05 NOTE — MAU Note (Signed)
Pt was seen here earlier but had to leave for mandatory meeting at work and is returning for her u/s. Nothing has changed with bleeding that she had this morning. Denies pain.

## 2016-10-05 NOTE — MAU Note (Signed)
Pt asked to leave, I advised it would be AMA because we need an u/s to r/o ectopic pregnancy. States she understands, but has a meeting she has to go to at work. I told her to come back as soon as she could be evaluated and let them know she has already had an exam and blood work drawn. Pt signed AMA form

## 2016-10-05 NOTE — MAU Note (Signed)
Pt states she has cramping and brownish discharge. Has also been getting migraines and right arm numbness. States her pregnancy tests at home were "inconclusive" When I asked what that meant she stated "well one line was dark and one line was light".

## 2016-10-05 NOTE — Discharge Instructions (Signed)
Vaginal Bleeding During Pregnancy, First Trimester °A small amount of bleeding (spotting) from the vagina is common in early pregnancy. Sometimes the bleeding is normal and is not a problem, and sometimes it is a sign of something serious. Be sure to tell your doctor about any bleeding from your vagina right away. °Follow these instructions at home: °· Watch your condition for any changes. °· Follow your doctor's instructions about how active you can be. °· If you are on bed rest: °¨ You may need to stay in bed and only get up to use the bathroom. °¨ You may be allowed to do some activities. °¨ If you need help, make plans for someone to help you. °· Write down: °¨ The number of pads you use each day. °¨ How often you change pads. °¨ How soaked (saturated) your pads are. °· Do not use tampons. °· Do not douche. °· Do not have sex or orgasms until your doctor says it is okay. °· If you pass any tissue from your vagina, save the tissue so you can show it to your doctor. °· Only take medicines as told by your doctor. °· Do not take aspirin because it can make you bleed. °· Keep all follow-up visits as told by your doctor. °Contact a doctor if: °· You bleed from your vagina. °· You have cramps. °· You have labor pains. °· You have a fever that does not go away after you take medicine. °Get help right away if: °· You have very bad cramps in your back or belly (abdomen). °· You pass large clots or tissue from your vagina. °· You bleed more. °· You feel light-headed or weak. °· You pass out (faint). °· You have chills. °· You are leaking fluid or have a gush of fluid from your vagina. °· You pass out while pooping (having a bowel movement). °This information is not intended to replace advice given to you by your health care provider. Make sure you discuss any questions you have with your health care provider. °Document Released: 03/02/2014 Document Revised: 03/23/2016 Document Reviewed: 06/23/2013 °Elsevier Interactive  Patient Education © 2017 [employer] ° °

## 2016-10-05 NOTE — MAU Provider Note (Signed)
Chief Complaint: Migraine; Numbness (Right arm); and Dysmenorrhea   First Provider Initiated Contact with Patient 10/05/16 0956        SUBJECTIVE HPI: Margaret Knight is a 30 y.o. [id number] at 5845w6d by LMP who presents to maternity admissions reporting brown spotting and pelvic cramping that started today. She denies vaginal itching/burning, urinary symptoms, h/a, dizziness, n/v, or fever/chills.    Abdominal Pain  This is a new problem. The current episode started today. The problem occurs intermittently. The pain is located in the LLQ, RLQ and suprapubic region. The quality of the pain is aching and cramping. The abdominal pain does not radiate. Pertinent negatives include no dysuria, fever, nausea or vomiting. Nothing aggravates the pain. The pain is relieved by nothing. She has tried nothing for the symptoms.  Vaginal Bleeding  The patient's primary symptoms include pelvic pain and vaginal bleeding. The patient's pertinent negatives include no vaginal discharge. The current episode started today. The pain is mild. She is pregnant. Associated symptoms include abdominal pain. Pertinent negatives include no back pain, chills, dysuria, fever, nausea or vomiting. The vaginal discharge was brown and thick. The vaginal bleeding is spotting. Nothing aggravates the symptoms. She has tried nothing for the symptoms. She uses nothing for contraception.   RN Note: Pt states she has cramping and brownish discharge. Has also been getting migraines and right arm numbness. States her pregnancy tests at home were "inconclusive" When I asked what that meant she stated "well one line was dark and one line was light".   Past Medical History:  Diagnosis Date  . Chlamydia   . History of recurrent UTI (urinary tract infection)   . Migraines   . Normal pregnancy, repeat 03/09/2012  . SVD (spontaneous vaginal delivery) 03/10/2012   Past Surgical History:  Procedure Laterality Date  . DILATION AND CURETTAGE OF UTERUS      Social History   Social History  . Marital status: Single    Spouse name: N/A  . Number of children: N/A  . Years of education: N/A   Occupational History  . Not on file.   Social History Main Topics  . Smoking status: Never Smoker  . Smokeless tobacco: Never Used  . Alcohol use No  . Drug use: No  . Sexual activity: Yes    Birth control/ protection: None   Other Topics Concern  . Not on file   Social History Narrative  . No narrative on file   No current facility-administered medications on file prior to encounter.    No current outpatient prescriptions on file prior to encounter.   No Known Allergies  I have reviewed patient's Past Medical Hx, Surgical Hx, Family Hx, Social Hx, medications and allergies.   ROS:  Review of Systems  Constitutional: Negative for chills and fever.  Gastrointestinal: Positive for abdominal pain. Negative for nausea and vomiting.  Genitourinary: Positive for pelvic pain and vaginal bleeding. Negative for dysuria and vaginal discharge.  Musculoskeletal: Negative for back pain.    Other systems negative   Physical Exam  Physical Exam Patient Vitals for the past 24 hrs:  BP Temp Pulse Resp  10/05/16 0923 115/74 98.5 F (36.9 C) 82 16   Constitutional: Well-developed, well-nourished female in no acute distress.  Cardiovascular: normal rate Respiratory: normal effort GI: Abd soft, non-tender. Pos BS x 4 MS: Extremities nontender, no edema, normal ROM Neurologic: Alert and oriented x 4.  GU: Neg CVAT.  PELVIC EXAM: Cervix pink, visually closed, without lesion, scant brown/red  discharge, vaginal walls and external genitalia normal Bimanual exam: Cervix 0/long/high, firm, anterior, neg CMT, uterus nontender, nonenlarged, adnexa without tenderness, enlargement, or mass  LAB RESULTS  --/--/O POS (05/08 1104)  IMAGING No results found.  MAU Management/MDM: Ordered usual first trimester r/o ectopic labs.   Pelvic exam and  cultures done Will check baseline Ultrasound to rule out ectopic.  This bleeding/pain can represent a normal pregnancy with bleeding, spontaneous abortion or even an ectopic which can be life-threatening.  The process as listed above helps to determine which of these is present.  Discussed plan of care with patient who agrees to proceed After orders placed, patient notified RN she needs to leave prior to completing US. Advised it would be AMA, she agrees to return later after work.  Warned undiagnosed ectopic can be life-threatening   ASSESSMENT Abdominal pain in early pregnancy Bleeding in early pregnancy Pregnancy of unknown location.  PLAN Patient left AMA. Encouraged to return here or to other Urgent Care/ED if she develops worsening of symptoms, increase in pain, fever, or other concerning symptoms.    Wynelle BourgeoisMarie Williams CNM, MSN Certified Nurse-Midwife 10/05/2016  9:58 AM

## 2016-10-06 LAB — GC/CHLAMYDIA PROBE AMP (~~LOC~~) NOT AT ARMC
Chlamydia: POSITIVE — AB
Neisseria Gonorrhea: NEGATIVE

## 2016-10-06 LAB — HIV ANTIBODY (ROUTINE TESTING W REFLEX): HIV Screen 4th Generation wRfx: NONREACTIVE

## 2016-10-09 ENCOUNTER — Other Ambulatory Visit: Payer: Self-pay | Admitting: Advanced Practice Midwife

## 2016-10-09 ENCOUNTER — Telehealth: Payer: Self-pay | Admitting: Advanced Practice Midwife

## 2016-10-09 DIAGNOSIS — A749 Chlamydial infection, unspecified: Secondary | ICD-10-CM

## 2016-10-09 DIAGNOSIS — O98811 Other maternal infectious and parasitic diseases complicating pregnancy, first trimester: Principal | ICD-10-CM

## 2016-10-09 DIAGNOSIS — O98819 Other maternal infectious and parasitic diseases complicating pregnancy, unspecified trimester: Secondary | ICD-10-CM

## 2016-10-09 HISTORY — DX: Other maternal infectious and parasitic diseases complicating pregnancy, unspecified trimester: O98.819

## 2016-10-09 HISTORY — DX: Chlamydial infection, unspecified: A74.9

## 2016-10-09 MED ORDER — AZITHROMYCIN 500 MG PO TABS: 1000.0000 mg | ORAL_TABLET | Freq: Once | ORAL | 1 refills | Status: AC

## 2016-10-09 NOTE — Telephone Encounter (Signed)
See telephone note Informed of chlamydia Treatment sent

## 2016-10-10 ENCOUNTER — Other Ambulatory Visit: Payer: Medicaid Other

## 2017-03-13 ENCOUNTER — Inpatient Hospital Stay (HOSPITAL_COMMUNITY): Payer: Medicaid Other

## 2017-03-13 ENCOUNTER — Other Ambulatory Visit: Payer: Self-pay | Admitting: Medical

## 2017-03-13 ENCOUNTER — Encounter (HOSPITAL_COMMUNITY): Payer: Self-pay

## 2017-03-13 ENCOUNTER — Inpatient Hospital Stay (HOSPITAL_COMMUNITY)
Admission: AD | Admit: 2017-03-13 | Discharge: 2017-03-13 | Disposition: A | Discharge status: Home / Self Care | Payer: Medicaid Other | Source: Ambulatory Visit | Attending: Obstetrics and Gynecology | Admitting: Obstetrics and Gynecology

## 2017-03-13 DIAGNOSIS — O26899 Other specified pregnancy related conditions, unspecified trimester: Secondary | ICD-10-CM

## 2017-03-13 DIAGNOSIS — R109 Unspecified abdominal pain: Secondary | ICD-10-CM | POA: Diagnosis present

## 2017-03-13 DIAGNOSIS — Z3491 Encounter for supervision of normal pregnancy, unspecified, first trimester: Secondary | ICD-10-CM

## 2017-03-13 DIAGNOSIS — M549 Dorsalgia, unspecified: Secondary | ICD-10-CM | POA: Diagnosis not present

## 2017-03-13 DIAGNOSIS — O26892 Other specified pregnancy related conditions, second trimester: Secondary | ICD-10-CM | POA: Diagnosis not present

## 2017-03-13 DIAGNOSIS — O9989 Other specified diseases and conditions complicating pregnancy, childbirth and the puerperium: Secondary | ICD-10-CM

## 2017-03-13 DIAGNOSIS — Z3A26 26 weeks gestation of pregnancy: Secondary | ICD-10-CM | POA: Diagnosis not present

## 2017-03-13 DIAGNOSIS — A749 Chlamydial infection, unspecified: Secondary | ICD-10-CM

## 2017-03-13 LAB — POCT PREGNANCY, URINE: Preg Test, Ur: POSITIVE — AB

## 2017-03-13 LAB — DIFFERENTIAL
BASOS ABS: 0 10*3/uL (ref 0.0–0.1)
Basophils Relative: 0 %
EOS ABS: 0 10*3/uL (ref 0.0–0.7)
EOS PCT: 1 %
LYMPHS ABS: 2.1 10*3/uL (ref 0.7–4.0)
LYMPHS PCT: 47 %
Monocytes Absolute: 0.4 10*3/uL (ref 0.1–1.0)
Monocytes Relative: 8 %
NEUTROS PCT: 44 %
Neutro Abs: 1.9 10*3/uL (ref 1.7–7.7)

## 2017-03-13 LAB — URINALYSIS, ROUTINE W REFLEX MICROSCOPIC
Bacteria, UA: NONE SEEN
Bilirubin Urine: NEGATIVE
GLUCOSE, UA: NEGATIVE mg/dL
Hgb urine dipstick: NEGATIVE
KETONES UR: NEGATIVE mg/dL
Nitrite: NEGATIVE
PROTEIN: NEGATIVE mg/dL
Specific Gravity, Urine: 1.023 (ref 1.005–1.030)
pH: 6 (ref 5.0–8.0)

## 2017-03-13 LAB — CBC
HCT: 32.4 % — ABNORMAL LOW (ref 36.0–46.0)
Hemoglobin: 10.5 g/dL — ABNORMAL LOW (ref 12.0–15.0)
MCH: 26.8 pg (ref 26.0–34.0)
MCHC: 32.4 g/dL (ref 30.0–36.0)
MCV: 82.7 fL (ref 78.0–100.0)
PLATELETS: 385 10*3/uL (ref 150–400)
RBC: 3.92 MIL/uL (ref 3.87–5.11)
RDW: 16.9 % — ABNORMAL HIGH (ref 11.5–15.5)
WBC: 4.4 10*3/uL (ref 4.0–10.5)

## 2017-03-13 LAB — GC/CHLAMYDIA PROBE AMP (~~LOC~~) NOT AT ARMC
CHLAMYDIA, DNA PROBE: POSITIVE — AB
Neisseria Gonorrhea: NEGATIVE

## 2017-03-13 LAB — WET PREP, GENITAL
CLUE CELLS WET PREP: NONE SEEN
SPERM: NONE SEEN
Trich, Wet Prep: NONE SEEN
Yeast Wet Prep HPF POC: NONE SEEN

## 2017-03-13 LAB — HCG, QUANTITATIVE, PREGNANCY: HCG, BETA CHAIN, QUANT, S: 114049 m[IU]/mL — AB (ref <5)

## 2017-03-13 LAB — ABO/RH: ABO/RH(D): O POS

## 2017-03-13 MED ORDER — AZITHROMYCIN 250 MG PO TABS: 1000.0000 mg | ORAL_TABLET | Freq: Once | ORAL | 0 refills | Status: AC

## 2017-03-13 MED ORDER — PRENATAL VITAMIN 27-0.8 MG PO TABS: 1.0000 | ORAL_TABLET | Freq: Every day | ORAL | 5 refills | Status: DC

## 2017-03-13 NOTE — MAU Note (Signed)
Pt here with c/o cramping and back pain.  Had an abortion in February and hasn't had a period since then. Started cramping worse this week and took HPT test this morning that was positive.
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
## 2017-03-13 NOTE — Discharge Instructions (Signed)
Abdominal Pain During Pregnancy °Belly (abdominal) pain is common during pregnancy. Most of the time, it is not a serious problem. Other times, it can be a sign that something is wrong with the pregnancy. Always tell your doctor if you have belly pain. °Follow these instructions at home: °Monitor your belly pain for any changes. The following actions may help you feel better: °· Do not have sex (intercourse) or put anything in your vagina until you feel better. °· Rest until your pain stops. °· Drink clear fluids if you feel sick to your stomach (nauseous). Do not eat solid food until you feel better. °· Only take medicine as told by your doctor. °· Keep all doctor visits as told. °Get help right away if: °· You are bleeding, leaking fluid, or pieces of tissue come out of your vagina. °· You have more pain or cramping. °· You keep throwing up (vomiting). °· You have pain when you pee (urinate) or have blood in your pee. °· You have a fever. °· You do not feel your baby moving as much. °· You feel very weak or feel like passing out. °· You have trouble breathing, with or without belly pain. °· You have a very bad headache and belly pain. °· You have fluid leaking from your vagina and belly pain. °· You keep having watery poop (diarrhea). °· Your belly pain does not go away after resting, or the pain gets worse. °This information is not intended to replace advice given to you by your health care provider. Make sure you discuss any questions you have with your health care provider. °Document Released: 10/04/2009 Document Revised: 05/24/2016 Document Reviewed: 05/15/2013 °Elsevier Interactive Patient Education © 2017 Elsevier Inc. ° °

## 2017-03-14 ENCOUNTER — Other Ambulatory Visit: Payer: Self-pay | Admitting: Advanced Practice Midwife

## 2017-03-14 MED ORDER — AZITHROMYCIN 250 MG PO TABS: 1000.0000 mg | ORAL_TABLET | Freq: Once | ORAL | 0 refills | Status: DC

## 2017-03-14 MED ORDER — AZITHROMYCIN 250 MG PO TABS: 1000.0000 mg | ORAL_TABLET | Freq: Once | ORAL | 0 refills | Status: AC

## 2017-03-15 ENCOUNTER — Telehealth (HOSPITAL_COMMUNITY): Payer: Self-pay | Admitting: *Deleted

## 2017-03-15 NOTE — Telephone Encounter (Signed)
1st attempt to contact patient regarding patient's STD lab results from her last visit. Left message on patient's voice mail to call back at her convenience. 

## 2017-03-16 ENCOUNTER — Telehealth: Payer: Self-pay | Admitting: Family Medicine

## 2017-03-16 DIAGNOSIS — A749 Chlamydial infection, unspecified: Secondary | ICD-10-CM

## 2017-03-16 DIAGNOSIS — O98811 Other maternal infectious and parasitic diseases complicating pregnancy, first trimester: Principal | ICD-10-CM

## 2017-03-16 NOTE — Telephone Encounter (Signed)
Patient reached by mobile phone regarding positive results for chlamydia and to be sure she has received treatment.  She states she has been treated, and her partner has been treated as well (she came to MAU for both Rx). Discussed to wait 4 weeks before intercourse and to be retested to be sure both partners have been cleared of infection (especially since this is a reinfection). Patient verbalized understanding.   Cleda ClarksElizabeth W. Mumaw, DO  OB Fellow [facility], [facility]

## 2017-04-17 LAB — OB RESULTS CONSOLE ABO/RH: RH Type: POSITIVE

## 2017-04-17 LAB — OB RESULTS CONSOLE HEPATITIS B SURFACE ANTIGEN
Hepatitis B Surface Ag: NEGATIVE
Hepatitis B Surface Ag: NEGATIVE

## 2017-04-17 LAB — OB RESULTS CONSOLE HIV ANTIBODY (ROUTINE TESTING)
HIV: NONREACTIVE
HIV: NONREACTIVE

## 2017-04-17 LAB — OB RESULTS CONSOLE RPR
RPR: NONREACTIVE
RPR: NONREACTIVE

## 2017-04-17 LAB — OB RESULTS CONSOLE ANTIBODY SCREEN: Antibody Screen: NEGATIVE

## 2017-04-17 LAB — OB RESULTS CONSOLE RUBELLA ANTIBODY, IGM
Rubella: IMMUNE
Rubella: IMMUNE

## 2017-04-17 LAB — OB RESULTS CONSOLE GC/CHLAMYDIA
CHLAMYDIA, DNA PROBE: NEGATIVE
GC PROBE AMP, GENITAL: NEGATIVE

## 2017-05-07 ENCOUNTER — Encounter (HOSPITAL_COMMUNITY): Payer: Self-pay | Admitting: *Deleted

## 2017-05-07 ENCOUNTER — Inpatient Hospital Stay (HOSPITAL_COMMUNITY)
Admission: AD | Admit: 2017-05-07 | Discharge: 2017-05-07 | Disposition: A | Discharge status: Home / Self Care | Payer: Medicaid Other | Source: Ambulatory Visit | Attending: Obstetrics and Gynecology | Admitting: Obstetrics and Gynecology

## 2017-05-07 ENCOUNTER — Inpatient Hospital Stay (HOSPITAL_COMMUNITY): Payer: Medicaid Other

## 2017-05-07 DIAGNOSIS — O4692 Antepartum hemorrhage, unspecified, second trimester: Secondary | ICD-10-CM | POA: Diagnosis present

## 2017-05-07 DIAGNOSIS — O209 Hemorrhage in early pregnancy, unspecified: Secondary | ICD-10-CM | POA: Diagnosis not present

## 2017-05-07 DIAGNOSIS — O26892 Other specified pregnancy related conditions, second trimester: Secondary | ICD-10-CM | POA: Insufficient documentation

## 2017-05-07 DIAGNOSIS — Z3A16 16 weeks gestation of pregnancy: Secondary | ICD-10-CM | POA: Insufficient documentation

## 2017-05-07 DIAGNOSIS — O4402 Placenta previa specified as without hemorrhage, second trimester: Secondary | ICD-10-CM

## 2017-05-07 DIAGNOSIS — Y92007 Garden or yard of unspecified non-institutional (private) residence as the place of occurrence of the external cause: Secondary | ICD-10-CM | POA: Diagnosis not present

## 2017-05-07 DIAGNOSIS — O4412 Placenta previa with hemorrhage, second trimester: Secondary | ICD-10-CM | POA: Insufficient documentation
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
## 2017-05-07 NOTE — Discharge Instructions (Signed)
Placenta Previa °Placenta previa is a condition in which the placenta implants in the lower part of the uterus in pregnant women. The placenta either partially or completely covers the opening to the cervix. This is a problem because the baby must pass through the cervix during delivery. There are three types of placenta previa: °· Marginal placenta previa. The placenta reaches within an inch (2.5 cm) of the cervical opening but does not cover it. °· Partial placenta previa. The placenta covers part of the cervical opening. °· Complete placenta previa. The placenta covers the entire cervical opening. ° °If the previa is marginal or partial and it is diagnosed in the first half of pregnancy, the placenta may move into a normal position as the pregnancy progresses and may no longer cover the cervix. It is important to keep all prenatal visits with your health care provider so you can be more closely monitored. °What are the causes? °The cause of this condition is not known. °What increases the risk? °This condition is more likely to develop in women who: °· Are carrying more than one baby (multiples). °· Have an abnormally shaped uterus. °· Have scars on the lining of the uterus. °· Have had surgeries involving the uterus, such as a cesarean delivery. °· Have delivered a baby before. °· Have a history of placenta previa. °· Have smoked or used cocaine during pregnancy. °· Are age 31 or older during pregnancy. ° °What are the signs or symptoms? °The main symptom of this condition is sudden, painless vaginal bleeding during the second half of pregnancy. The amount of bleeding can be very light at first, and it usually stops on its own. Heavier bleeding episodes may also happen. Some women with placenta previa may have no bleeding at all. °How is this diagnosed? °· This condition is diagnosed: °? From an ultrasound. This test uses sound waves to find where the placenta is located before you have any bleeding  episodes. °? During a checkup after vaginal bleeding is noticed. °· If you are diagnosed with a partial or complete previa, digital exams with fingers will generally be avoided. Your health care provider will still perform a speculum exam. °· If you did not have an ultrasound during your pregnancy, placenta previa may not be diagnosed until bleeding occurs during labor. °How is this treated? °Treatment for this condition may include: °· Decreased activity. °· Bed rest at home or in the hospital. °· Pelvic rest. Nothing is placed inside the vagina during pelvic rest. This means not having sex and not using tampons or douches. °· A blood transfusion to replace blood that you have lost (maternal blood loss). °· A cesarean delivery. This may be performed if: °? The bleeding is heavy and cannot be controlled. °? The placenta completely covers the cervix. °· Medicines to stop premature labor or to help the baby's lungs to mature. This treatment may be used if you need delivery before your pregnancy is full-term. ° °Your treatment will be decided based on: °· How much you are bleeding, or whether the bleeding has stopped. °· How far along you are in your pregnancy. °· The condition of your baby. °· The type of placenta previa that you have. ° °Follow these instructions at home: °· Get plenty of rest and lessen activity as told by your health care provider. °· Stay on bed rest for as long as told by your health care provider. °· Do not have sex, use tampons, use a douche, or place   anything inside of your vagina if your health care provider recommended pelvic rest. °· Take over-the-counter and prescription medicines as told by your health care provider. °· Keep all follow-up visits as told by your health care provider. This is important. °Get help right away if: °· You have vaginal bleeding, even if in small amounts and even if you have no pain. °· You have cramping or regular contractions. °· You have pain in your abdomen  or your lower back. °· You have a feeling of increased pressure in your pelvis. °· You have increased watery or bloody mucus from the vagina. °This information is not intended to replace advice given to you by your health care provider. Make sure you discuss any questions you have with your health care provider. °Document Released: 10/16/2005 Document Revised: 07/05/2016 Document Reviewed: 04/29/2016 °Elsevier Interactive Patient Education © 2018 Elsevier Inc. ° °

## 2017-05-07 NOTE — MAU Note (Signed)
Urine sent to lab 
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
## 2017-06-01 NOTE — Telephone Encounter (Signed)
See note

## 2017-07-24 ENCOUNTER — Encounter (HOSPITAL_COMMUNITY): Payer: Self-pay

## 2017-07-24 ENCOUNTER — Encounter: Payer: Self-pay | Admitting: Certified Nurse Midwife

## 2017-07-24 ENCOUNTER — Inpatient Hospital Stay (HOSPITAL_COMMUNITY): Payer: 59

## 2017-07-24 ENCOUNTER — Inpatient Hospital Stay (HOSPITAL_COMMUNITY)
Admission: AD | Admit: 2017-07-24 | Discharge: 2017-08-17 | DRG: 833 | Disposition: A | Discharge status: Home / Self Care | Payer: 59 | Source: Ambulatory Visit | Attending: Family Medicine | Admitting: Family Medicine

## 2017-07-24 DIAGNOSIS — O99810 Abnormal glucose complicating pregnancy: Secondary | ICD-10-CM | POA: Diagnosis present

## 2017-07-24 DIAGNOSIS — O99012 Anemia complicating pregnancy, second trimester: Secondary | ICD-10-CM | POA: Diagnosis present

## 2017-07-24 DIAGNOSIS — O09523 Supervision of elderly multigravida, third trimester: Secondary | ICD-10-CM | POA: Diagnosis not present

## 2017-07-24 DIAGNOSIS — D649 Anemia, unspecified: Secondary | ICD-10-CM | POA: Diagnosis present

## 2017-07-24 DIAGNOSIS — O4403 Placenta previa specified as without hemorrhage, third trimester: Secondary | ICD-10-CM

## 2017-07-24 DIAGNOSIS — O4413 Placenta previa with hemorrhage, third trimester: Principal | ICD-10-CM | POA: Diagnosis present

## 2017-07-24 DIAGNOSIS — Z3009 Encounter for other general counseling and advice on contraception: Secondary | ICD-10-CM | POA: Diagnosis present

## 2017-07-24 DIAGNOSIS — T7491XA Unspecified adult maltreatment, confirmed, initial encounter: Secondary | ICD-10-CM | POA: Diagnosis present

## 2017-07-24 DIAGNOSIS — O4692 Antepartum hemorrhage, unspecified, second trimester: Secondary | ICD-10-CM | POA: Diagnosis not present

## 2017-07-24 DIAGNOSIS — O4690 Antepartum hemorrhage, unspecified, unspecified trimester: Secondary | ICD-10-CM | POA: Diagnosis present

## 2017-07-24 DIAGNOSIS — O4400 Placenta previa specified as without hemorrhage, unspecified trimester: Secondary | ICD-10-CM

## 2017-07-24 DIAGNOSIS — R8271 Bacteriuria: Secondary | ICD-10-CM

## 2017-07-24 DIAGNOSIS — Z3A27 27 weeks gestation of pregnancy: Secondary | ICD-10-CM

## 2017-07-24 DIAGNOSIS — O99891 Other specified diseases and conditions complicating pregnancy: Secondary | ICD-10-CM

## 2017-07-24 DIAGNOSIS — O9989 Other specified diseases and conditions complicating pregnancy, childbirth and the puerperium: Secondary | ICD-10-CM

## 2017-07-24 HISTORY — DX: Other maternal infectious and parasitic diseases complicating pregnancy, unspecified trimester: O98.819

## 2017-07-24 HISTORY — DX: Family history of malignant neoplasm of breast: Z80.3

## 2017-07-24 HISTORY — DX: Chlamydial infection, unspecified: A74.9

## 2017-07-24 LAB — URINALYSIS, ROUTINE W REFLEX MICROSCOPIC
BILIRUBIN URINE: NEGATIVE
Bacteria, UA: NONE SEEN
Glucose, UA: NEGATIVE mg/dL
HGB URINE DIPSTICK: NEGATIVE
Ketones, ur: 20 mg/dL — AB
LEUKOCYTES UA: NEGATIVE
NITRITE: NEGATIVE
PH: 5 (ref 5.0–8.0)
Protein, ur: 30 mg/dL — AB
SPECIFIC GRAVITY, URINE: 1.024 (ref 1.005–1.030)

## 2017-07-24 LAB — CBC
HEMATOCRIT: 31 % — AB (ref 36.0–46.0)
Hemoglobin: 10 g/dL — ABNORMAL LOW (ref 12.0–15.0)
MCH: 26.7 pg (ref 26.0–34.0)
MCHC: 32.3 g/dL (ref 30.0–36.0)
MCV: 82.9 fL (ref 78.0–100.0)
PLATELETS: 353 10*3/uL (ref 150–400)
RBC: 3.74 MIL/uL — ABNORMAL LOW (ref 3.87–5.11)
RDW: 15.2 % (ref 11.5–15.5)
WBC: 4.5 10*3/uL (ref 4.0–10.5)

## 2017-07-24 LAB — TYPE AND SCREEN
ABO/RH(D): O POS
Antibody Screen: NEGATIVE

## 2017-07-24 LAB — WET PREP, GENITAL
CLUE CELLS WET PREP: NONE SEEN
Sperm: NONE SEEN
Trich, Wet Prep: NONE SEEN
Yeast Wet Prep HPF POC: NONE SEEN

## 2017-07-24 MED ORDER — SODIUM CHLORIDE 0.9% FLUSH
3.0000 mL | INTRAVENOUS | Status: DC | PRN
  Administered 2017-07-27 – 2017-07-30 (×3): 3 mL via INTRAVENOUS
  Filled 2017-07-24 (×3): qty 3

## 2017-07-24 MED ORDER — SODIUM CHLORIDE 0.9% FLUSH
3.0000 mL | Freq: Two times a day (BID) | INTRAVENOUS | Status: DC
  Administered 2017-07-24 – 2017-08-06 (×25): 3 mL via INTRAVENOUS

## 2017-07-24 MED ORDER — SODIUM CHLORIDE 0.9 % IV SOLN: 250.0000 mL | INTRAVENOUS | Status: DC | PRN

## 2017-07-24 MED ORDER — BETAMETHASONE SOD PHOS & ACET 6 (3-3) MG/ML IJ SUSP
12.0000 mg | INTRAMUSCULAR | Status: AC
  Administered 2017-07-24 – 2017-07-25 (×2): 12 mg via INTRAMUSCULAR
  Filled 2017-07-24 (×2): qty 2

## 2017-07-24 MED ORDER — ACETAMINOPHEN 325 MG PO TABS
650.0000 mg | ORAL_TABLET | ORAL | Status: DC | PRN
Start: 2017-07-24 — End: 2017-08-17
  Administered 2017-08-02: 650 mg via ORAL
  Filled 2017-07-24: qty 2

## 2017-07-24 MED ORDER — PRENATAL MULTIVITAMIN CH
1.0000 | ORAL_TABLET | Freq: Every day | ORAL | Status: DC
  Administered 2017-07-24 – 2017-08-17 (×23): 1 via ORAL
  Filled 2017-07-24 (×25): qty 1

## 2017-07-24 MED ORDER — DOCUSATE SODIUM 100 MG PO CAPS
100.0000 mg | ORAL_CAPSULE | Freq: Every day | ORAL | Status: DC
  Administered 2017-07-24 – 2017-08-05 (×10): 100 mg via ORAL
  Filled 2017-07-24 (×14): qty 1

## 2017-07-24 MED ORDER — CALCIUM CARBONATE ANTACID 500 MG PO CHEW
2.0000 | CHEWABLE_TABLET | ORAL | Status: DC | PRN
  Administered 2017-07-27 – 2017-08-14 (×6): 400 mg via ORAL
  Filled 2017-07-24 (×7): qty 2
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
## 2017-07-24 NOTE — MAU Note (Signed)
Pt reports vaginal bleeding start 0700 this morning. Started out with a gush then has been spotting since initial gush.

## 2017-07-25 ENCOUNTER — Encounter (HOSPITAL_COMMUNITY): Payer: Self-pay | Admitting: Obstetrics & Gynecology

## 2017-07-25 DIAGNOSIS — Z3A27 27 weeks gestation of pregnancy: Secondary | ICD-10-CM

## 2017-07-25 LAB — GC/CHLAMYDIA PROBE AMP (~~LOC~~) NOT AT ARMC
Chlamydia: NEGATIVE
Neisseria Gonorrhea: NEGATIVE

## 2017-07-25 NOTE — Progress Notes (Signed)
Pt taken off monitor to eat dinner. FHR has good variability and no decels are noted.

## 2017-07-25 NOTE — Progress Notes (Signed)
FACULTY PRACTICE ANTEPARTUM COMPREHENSIVE PROGRESS NOTE  Margaret Knight is a 31 y.o. [id number] at [redacted]w[redacted]d who is admitted for vasa previa and bleeding.  Estimated Date of Delivery: 10/22/17 Fetal presentation is unsure.  Length of Stay:  1 Days. Admitted 07/24/2017  Subjective: No active bleeding Patient reports good fetal movement.  She reports no uterine contractions, no bleeding and no loss of fluid per vagina.  Vitals:  Blood pressure 106/63, pulse 66, temperature (!) 97.4 F (36.3 C), temperature source Oral, resp. rate 20, height  (1.702 m), weight 162 lb (73.5 kg), last menstrual period 09/08/2016, SpO2 100 %, unknown if currently breastfeeding. Physical Examination: CONSTITUTIONAL: Well-developed, well-nourished female in no acute distress.  HENT:  Normocephalic, atraumatic, External right and left ear normal. Oropharynx is clear and moist EYES: Conjunctivae and EOM are normal. Pupils are equal, round, and reactive to light. No scleral icterus.  NECK: Normal range of motion, supple, no masses SKIN: Skin is warm and dry. No rash noted. Not diaphoretic. No erythema. No pallor. NEUROLGIC: Alert and oriented to person, place, and time. Normal reflexes, muscle tone coordination. No cranial nerve deficit noted. PSYCHIATRIC: Normal mood and affect. Normal behavior. Normal judgment and thought content. CARDIOVASCULAR: Normal heart rate noted, regular rhythm RESPIRATORY: Effort and breath sounds normal, no problems with respiration noted MUSCULOSKELETAL: Normal range of motion. No edema and no tenderness. 2+ distal pulses. ABDOMEN: Soft, nontender, nondistended, gravid. CERVIX:  Old blood on pad  Fetal monitoring: FHR: 145 bpm, Variability: moderate, Accelerations: 10 x 10 Present, Decelerations: Absent  Uterine activity: No contractions  Results for orders placed or performed during the hospital encounter of 07/24/17 (from the past 48 hour(s))  Urinalysis, Routine w reflex  microscopic     Status: Abnormal   Collection Time: 07/24/17 10:55 AM  Result Value Ref Range   Color, Urine YELLOW YELLOW   APPearance HAZY (A) CLEAR   Specific Gravity, Urine 1.024 1.005 - 1.030   pH 5.0 5.0 - 8.0   Glucose, UA NEGATIVE NEGATIVE mg/dL   Hgb urine dipstick NEGATIVE NEGATIVE   Bilirubin Urine NEGATIVE NEGATIVE   Ketones, ur 20 (A) NEGATIVE mg/dL   Protein, ur 30 (A) NEGATIVE mg/dL   Nitrite NEGATIVE NEGATIVE   Leukocytes, UA NEGATIVE NEGATIVE   RBC / HPF 0-5 0 - 5 RBC/hpf   WBC, UA 0-5 0 - 5 WBC/hpf   Bacteria, UA NONE SEEN NONE SEEN   Squamous Epithelial / LPF 0-5 (A) NONE SEEN   Mucus PRESENT    Ca Oxalate Crys, UA PRESENT   Wet prep, genital     Status: Abnormal   Collection Time: 07/24/17 11:49 AM  Result Value Ref Range   Yeast Wet Prep HPF POC NONE SEEN NONE SEEN   Trich, Wet Prep NONE SEEN NONE SEEN   Clue Cells Wet Prep HPF POC NONE SEEN NONE SEEN   WBC, Wet Prep HPF POC MODERATE (A) NONE SEEN    Comment: MODERATE BACTERIA SEEN   Sperm NONE SEEN   CBC     Status: Abnormal   Collection Time: 07/24/17 12:17 PM  Result Value Ref Range   WBC 4.5 4.0 - 10.5 K/uL   RBC 3.74 (L) 3.87 - 5.11 MIL/uL   Hemoglobin 10.0 (L) 12.0 - 15.0 g/dL   HCT 54.0 (L) 98.1 - 19.1 %   MCV 82.9 78.0 - 100.0 fL   MCH 26.7 26.0 - 34.0 pg   MCHC 32.3 30.0 - 36.0 g/dL   RDW 47.8 29.5 -  15.5 %   Platelets 353 150 - 400 K/uL  Type and screen [facility]     Status: None   Collection Time: 07/24/17 12:17 PM  Result Value Ref Range   ABO/RH(D) O POS    Antibody Screen NEG    Sample Expiration 07/27/2017     [place] Mfm Ob Transvaginal  Result Date: 07/24/2017 ----------------------------------------------------------------------  OBSTETRICS REPORT                      (Signed Final 07/24/2017 02:30 pm) ---------------------------------------------------------------------- Patient Info  ID #:       [id number]                         D.O.B.:   08/27/1986 (31 yrs)   Name:       Margaret Knight              Visit Date:  07/24/2017 01:14 pm ---------------------------------------------------------------------- Performed By  Performed By:     Tomma Lightning             Ref. Address:     [address]                    [address]                                                             [place],                                                             [place] [address]  Attending:        Rema Fendt MD      Secondary Phy.:   MAU Nursing-                                                             MAU/Triage  Referred By:      Juleen China              Location:         [facility] CNM ---------------------------------------------------------------------- Orders   #  Description                                 Code   1  [place] MFM OB LIMITED                           76815.01   2  [place] MFM OB TRANSVAGINAL                      19147.8  ----------------------------------------------------------------------   #  Ordered By  Order #        Accession #    Episode #   1  MELANIE BHAMBRI          [id number]      [id number]     [id number]   2  Rema Fendt           [id number]      [id number]     [id number]  ---------------------------------------------------------------------- Indications   [redacted] weeks gestation of pregnancy                Z3A.27   Vaginal bleeding in pregnancy, second          O46.92   trimester   Placenta previa specified as without           O44.02   hemorrhage, second trimester  ---------------------------------------------------------------------- OB History  Gravidity:    12        Term:   5        Prem:   0        SAB:   2  TOP:          4        Living:  5 ---------------------------------------------------------------------- Fetal Evaluation  Num Of Fetuses:     1  Fetal Heart         133  Rate(bpm):  Cardiac Activity:   Observed  Presentation:       Cephalic  Placenta:           Vasa Previa  Amniotic Fluid  AFI FV:       Subjectively low-normal  AFI Sum(cm)     %Tile       Largest Pocket(cm)  8.8             4           2.5  RUQ(cm)       RLQ(cm)       LUQ(cm)        LLQ(cm)  2.5           2.1           2.4            1.8 ---------------------------------------------------------------------- Gestational Age  LMP:           45w 4d       Date:   09/08/16                 EDD:   06/15/17  Best:          27w 1d    Det. ByMarcella Dubs         EDD:   10/22/17                                      (03/13/17) ---------------------------------------------------------------------- Cervix Uterus Adnexa  Cervix  Length:            3.5  cm.  Appears closed, without funnelling. Measured transvaginally. ---------------------------------------------------------------------- Comments  There is what appears to be a fetal vessel traversing the  internal os which means Ms. Predmore has a vasa previa.  This  puts her at risk of IUFD should the vessel tare.  Fortunately,  she is not bleeding at this time.  Thus, I would admit her for a  course of betamethasone and close observation having a low  threshold for proceeding with delivery should she again  develop vaginal bleeding.  Case discussed with Thressa Sheller, CNM. ---------------------------------------------------------------------- Impression  Single living intrauterine pregnancy at  Normal amniotic fluid volume.  Vasa previa. ---------------------------------------------------------------------- Recommendations  See comments. ----------------------------------------------------------------------                 Rema Fendt, MD Electronically Signed Final Report   07/24/2017 02:30 pm ----------------------------------------------------------------------  [place] Mfm Ob Limited  Result Date: 07/24/2017 ----------------------------------------------------------------------  OBSTETRICS REPORT                      (Signed Final 07/24/2017 02:30 pm)  ---------------------------------------------------------------------- Patient Info  ID #:       [id number]                         D.O.B.:   08-Sep-1986 (31 yrs)  Name:       Margaret Knight              Visit Date:  07/24/2017 01:14 pm ---------------------------------------------------------------------- Performed By  Performed By:     Tomma Lightning             Ref. Address:     [address]                    [address]                                                             [place],                                                             [place] [address]  Attending:        Rema Fendt MD      Secondary Phy.:   MAU Nursing-                                                             MAU/Triage  Referred By:      Juleen China              Location:         [facility] CNM ---------------------------------------------------------------------- Orders   #  Description                                 Code   1  [place] MFM OB LIMITED                           76815.01   2  [place] MFM OB TRANSVAGINAL                      77412.8  ----------------------------------------------------------------------   #  Ordered By               Order #        Accession #    Episode #   1  Donette Larry          [id number]      [id number]     [id number]   2  Rema Fendt           [id number]      [id number]     [id number]  ---------------------------------------------------------------------- Indications   [redacted] weeks gestation of pregnancy                Z3A.27   Vaginal bleeding in pregnancy, second          O46.92   trimester   Placenta previa specified as without           O44.02   hemorrhage, second trimester  ---------------------------------------------------------------------- OB History  Gravidity:    12        Term:   5        Prem:   0        SAB:   2  TOP:          4        Living:  5 ---------------------------------------------------------------------- Fetal Evaluation  Num Of Fetuses:     1  Fetal  Heart         133  Rate(bpm):  Cardiac Activity:   Observed  Presentation:       Cephalic  Placenta:           Vasa Previa  Amniotic Fluid  AFI FV:      Subjectively low-normal  AFI Sum(cm)     %Tile       Largest Pocket(cm)  8.8             4           2.5  RUQ(cm)       RLQ(cm)       LUQ(cm)        LLQ(cm)  2.5           2.1           2.4            1.8 ---------------------------------------------------------------------- Gestational Age  LMP:           45w 4d       Date:   09/08/16                 EDD:   06/15/17  Best:          27w 1d    Det. ByMarcella Dubs         EDD:   10/22/17                                      (03/13/17) ---------------------------------------------------------------------- Cervix Uterus Adnexa  Cervix  Length:            3.5  cm.  Appears closed, without funnelling. Measured transvaginally. ---------------------------------------------------------------------- Comments  There is what appears to be a fetal vessel traversing the  internal os which means Ms. Escalona has a vasa previa.  This  puts her at risk of IUFD should the vessel tare.  Fortunately,  she is not bleeding at this time.  Thus, I would admit her for a  course of betamethasone  and close observation having a low  threshold for proceeding with delivery should she again  develop vaginal bleeding.  Case discussed with Thressa Sheller, CNM. ---------------------------------------------------------------------- Impression  Single living intrauterine pregnancy at  Normal amniotic fluid volume.  Vasa previa. ---------------------------------------------------------------------- Recommendations  See comments. ----------------------------------------------------------------------                 Rema Fendt, MD Electronically Signed Final Report   07/24/2017 02:30 pm ----------------------------------------------------------------------   Current scheduled medications . betamethasone acetate-betamethasone sodium  phosphate  12 mg Intramuscular Q24H  . docusate sodium  100 mg Oral Daily  . prenatal multivitamin  1 tablet Oral Q1200  . sodium chloride flush  3 mL Intravenous Q12H    I have reviewed the patient's current medications.  ASSESSMENT: Principal Problem:   Vasa previa   PLAN: Will continue inpatient close observation. Ordered for CBC, T&S q72 hours Bleeding precautions reviewed Cesarean delivery indicated if needed Reassuring FHR for now; betamethasone regimen ongoing Continue routine antenatal care.   Jaynie Collins, MD, FACOG Attending Obstetrician & Gynecologist Faculty Practice, [facility]

## 2017-07-25 NOTE — Progress Notes (Signed)
UR chart review completed.  

## 2017-07-25 NOTE — Consult Note (Signed)
Asked by [facility] to provide prenatal consultation for patient at risk for preterm delivery due to placenta previa/vasa previa.  Mother is 31 y.o. G12 P5-0-6-5 who is now 27.[redacted] weeks EGA, admitted yesterday after episode of bleeding and intermittent UCs. Now stable with reassuring FHR. Received 2nd dose of betamethasone 4pm today.  Discussed usual expectations for preterm infant at 63 - [redacted] weeks gestation, including possible needs for DR resuscitation, respiratory support, IV access, and blood products.  Also presented risks of death (unlikely) or serious morbidity, possible length of stay in NICU until [facility] +/-  Discussed advantages of feeding with mother's milk. She plans to pump postnatally and breast feed long-term. Also discussed possible use of donor milk.  Patient was attentive, had appropriate questions and concerns, and expressed appreciation of my input.  Thank you for consulting Neonatology.  Total time 45 minutes.  JWimmer, MD

## 2017-07-26 ENCOUNTER — Inpatient Hospital Stay (HOSPITAL_COMMUNITY): Payer: 59

## 2017-07-26 DIAGNOSIS — O4690 Antepartum hemorrhage, unspecified, unspecified trimester: Secondary | ICD-10-CM | POA: Diagnosis present

## 2017-07-26 NOTE — Progress Notes (Signed)
Pt placed back on monitor.

## 2017-07-26 NOTE — Progress Notes (Signed)
FACULTY PRACTICE ANTEPARTUM PROGRESS NOTE  Margaret Knight is a 31 y.o. [id number] at [redacted]w[redacted]d who is admitted for bleeding in the setting of vasa previa.  Estimated Date of Delivery: 10/22/17  Length of Stay:  2 Days. Admitted 07/24/2017  Subjective: Patient reports good fetal movement.  She reports occasional mild cramping as well as some back pain, back pain is most concerning to her. She continues to have a small amount of spotting, denies leaking of fluid. Is constipated.   Vitals:  Blood pressure 115/60, pulse 84, temperature 98.7 F (37.1 C), temperature source Oral, resp. rate 16, height  (1.702 m), weight 162 lb (73.5 kg), last menstrual period 09/08/2016, SpO2 99 %, unknown if currently breastfeeding. Physical Examination: CONSTITUTIONAL: Well-developed, well-nourished female in no acute distress.  NEUROLGIC: Alert and oriented to person, place, and time. Normal reflexes, muscle tone coordination. No cranial nerve deficit noted. PSYCHIATRIC: Normal mood and affect. Normal behavior. Normal judgment and thought content. CARDIOVASCULAR: Normal heart rate noted, regular rhythm RESPIRATORY: Effort normal, no problems with respiration noted MUSCULOSKELETAL: Normal range of motion. No edema and no tenderness. ABDOMEN: Soft, gravid, non-tender, non-distended CERVIX:  deferred    Current scheduled medications . docusate sodium  100 mg Oral Daily  . prenatal multivitamin  1 tablet Oral Q1200  . sodium chloride flush  3 mL Intravenous Q12H    I have reviewed the patient's current medications.  ASSESSMENT: Principal Problem:   Vasa previa Active Problems:   Vaginal bleeding in pregnancy   PLAN:  Vasa previa - mild spotting, will keep overnight for monitoring  Routine prenatal care - records obtained from prior provider - for anatomy/growth today   Continue routine antenatal care.   Baldemar Lenis, M.D. Attending Obstetrician & Gynecologist, [facility]  for [employer], [facility]

## 2017-07-27 LAB — TYPE AND SCREEN
ABO/RH(D): O POS
Antibody Screen: NEGATIVE

## 2017-07-27 LAB — CBC
HEMATOCRIT: 30.9 % — AB (ref 36.0–46.0)
Hemoglobin: 10 g/dL — ABNORMAL LOW (ref 12.0–15.0)
MCH: 26.9 pg (ref 26.0–34.0)
MCHC: 32.4 g/dL (ref 30.0–36.0)
MCV: 83.1 fL (ref 78.0–100.0)
PLATELETS: 364 10*3/uL (ref 150–400)
RBC: 3.72 MIL/uL — ABNORMAL LOW (ref 3.87–5.11)
RDW: 15.2 % (ref 11.5–15.5)
WBC: 11.1 10*3/uL — ABNORMAL HIGH (ref 4.0–10.5)
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
## 2017-07-27 NOTE — Progress Notes (Signed)
FACULTY PRACTICE ANTEPARTUM COMPREHENSIVE PROGRESS NOTE  Margaret Knight is a 31 y.o. [id number] at [redacted]w[redacted]d who is admitted for vasa previa and bleeding.  Estimated Date of Delivery: 10/22/17 Fetal presentation is unsure.  Length of Stay:  3 Days. Admitted 07/24/2017  Subjective: No active bleeding Patient reports good fetal movement.  She reports no uterine contractions, no bleeding and no loss of fluid per vagina.  Vitals:  Blood pressure (!) 97/49, pulse 74, temperature 98.3 F (36.8 C), temperature source Oral, resp. rate 16, height  (1.702 m), weight 162 lb (73.5 kg), last menstrual period 09/08/2016, SpO2 99 %, unknown if currently breastfeeding. Physical Examination: CONSTITUTIONAL: Well-developed, well-nourished female in no acute distress.  HENT:  Normocephalic, atraumatic, External right and left ear normal. Oropharynx is clear and moist EYES: Conjunctivae and EOM are normal. Pupils are equal, round, and reactive to light. No scleral icterus.  NECK: Normal range of motion, supple, no masses SKIN: Skin is warm and dry. No rash noted. Not diaphoretic. No erythema. No pallor. NEUROLGIC: Alert and oriented to person, place, and time. Normal reflexes, muscle tone coordination. No cranial nerve deficit noted. PSYCHIATRIC: Normal mood and affect. Normal behavior. Normal judgment and thought content. CARDIOVASCULAR: Normal heart rate noted, regular rhythm RESPIRATORY: Effort and breath sounds normal, no problems with respiration noted MUSCULOSKELETAL: Normal range of motion. No edema and no tenderness. 2+ distal pulses. ABDOMEN: Soft, nontender, nondistended, gravid. CERVIX:  Old blood on pad  Fetal monitoring: FHR: 150 bpm, Variability: moderate, Accelerations: 10 x 10 Present, Decelerations: Absent  Uterine activity: No contractions  Results for orders placed or performed during the hospital encounter of 07/24/17 (from the past 48 hour(s))  CBC     Status: Abnormal   Collection  Time: 07/26/17 11:50 PM  Result Value Ref Range   WBC 11.1 (H) 4.0 - 10.5 K/uL   RBC 3.72 (L) 3.87 - 5.11 MIL/uL   Hemoglobin 10.0 (L) 12.0 - 15.0 g/dL   HCT 30.8 (L) 65.7 - 84.6 %   MCV 83.1 78.0 - 100.0 fL   MCH 26.9 26.0 - 34.0 pg   MCHC 32.4 30.0 - 36.0 g/dL   RDW 96.2 95.2 - 84.1 %   Platelets 364 150 - 400 K/uL  Type and screen     Status: None   Collection Time: 07/26/17 11:51 PM  Result Value Ref Range   ABO/RH(D) O POS    Antibody Screen NEG    Sample Expiration 07/29/2017     [place] Mfm Ob Detail +14 Wk  Result Date: 07/27/2017 ----------------------------------------------------------------------  OBSTETRICS REPORT                      (Signed Final 07/27/2017 08:29 am) ---------------------------------------------------------------------- Patient Info  ID #:       [id number]                         D.O.B.:   [birth year] (31 yrs)  Name:       Margaret Knight              Visit Date:  07/26/2017 04:49 pm ---------------------------------------------------------------------- Performed By  Performed By:     Tomma Lightning             Ref. Address:     [address]                    [place]  [place],                                                             [place] [address]  Attending:        Ledon Snare MD         Secondary Phy.:   MAU Nursing-                                                             MAU/Triage  Referred By:      Juleen China              Location:         [facility] ---------------------------------------------------------------------- Orders   #  Description                                 Code   1  [place] MFM OB DETAIL +14 WK                     76811.01  ----------------------------------------------------------------------   #  Ordered By               Order #        Accession #    Episode #   1  Leroy Libman              [id number]      [id number]     [id number]   ---------------------------------------------------------------------- Indications   [redacted] weeks gestation of pregnancy                Z3A.27   Vaginal bleeding in pregnancy, second          O46.92   trimester   Placenta previa specified as without           O44.02   hemorrhage, second trimester   Encounter for fetal anatomic survey            Z36.89  ---------------------------------------------------------------------- OB History  Blood Type:            Height:  5'7"   Weight (lb):  162      BMI:   25.37  Gravidity:    12        Term:   5        Prem:   0        SAB:   2  TOP:          4        Living:  5 ---------------------------------------------------------------------- Fetal Evaluation  Num Of Fetuses:     1  Fetal Heart         158  Rate(bpm):  Cardiac Activity:   Observed  Presentation:       Cephalic  Placenta:           Vasa Previa  Amniotic Fluid  AFI FV:      Subjectively within  normal limits                              Largest Pocket(cm)                              4.11 ---------------------------------------------------------------------- Biometry  BPD:      63.5  mm     G. Age:  25w 5d          3  %    CI:        72.61   %   70 - 86                                                          FL/HC:      21.6   %   18.6 - 20.4  HC:       237   mm     G. Age:  25w 6d        < 3  %    HC/AC:      1.04       1.05 - 1.21  AC:      228.6  mm     G. Age:  27w 2d         36  %    FL/BPD:     80.6   %   71 - 87  FL:       51.2  mm     G. Age:  27w 3d         35  %    FL/AC:      22.4   %   20 - 24  HUM:      46.5  mm     G. Age:  27w 3d         46  %  Est. FW:    1019  gm      2 lb 4 oz     44  % ---------------------------------------------------------------------- Gestational Age  LMP:           45w 6d       Date:   09/08/16                 EDD:   06/15/17  U/S Today:     26w 4d                                        EDD:   10/28/17  Best:          27w 3d    Det. ByMarcella Dubs         EDD:   10/22/17                                       (03/13/17) ---------------------------------------------------------------------- Anatomy  Cranium:               Appears normal         Aortic Arch:  Appears normal  Cavum:                 Appears normal         Ductal Arch:            Not well visualized  Ventricles:            Appears normal         Diaphragm:              Appears normal  Choroid Plexus:        Appears normal         Stomach:                Appears normal, left                                                                        sided  Cerebellum:            Appears normal         Abdomen:                Appears normal  Posterior Fossa:       Appears normal         Abdominal Wall:         Not well visualized  Nuchal Fold:           Not applicable (>20    Cord Vessels:           Appears normal ([redacted]                         wks GA)                                        vessel cord)  Face:                  Orbits appear          Kidneys:                Appear normal                         normal; proile nwv  Lips:                  Not well visualized    Bladder:                Appears normal  Thoracic:              Appears normal         Spine:                  Limited views  appear normal  Heart:                 Appears normal         Upper Extremities:      Appears normal;                         (4CH, axis, and si                             hands nwv  RVOT:                  Appears normal         Lower Extremities:      Appears normal  LVOT:                  Appears normal  Other:  Fetus appears to be a female. Heels visualized. Technically difficult          due to fetal position. ---------------------------------------------------------------------- Cervix Uterus Adnexa  Cervix  Length:           3.97  cm.  Normal appearance by transabdominal scan.  Uterus  No abnormality visualized.  Left Ovary  Size(cm)       3.2 x    1.8    x   1.8       Vol(ml): 5.4  Within normal limits.  Right Ovary  Size(cm)       3.1 x     2     x  2.6       Vol(ml): 8.4  Within normal limits. ---------------------------------------------------------------------- Impression  Singleton intrauterine pregnancy at 27 weeks 3 days  gestation with fetal cardiac activity  Cephalic presentation  Vasa Previa  Normal appearing fetal growth and amniotic fluid volume  Limited fetal anatomic survey secondary to fetal position  Normal appearing cervical length  No apparent sonographic evidence of intrauterine bleed noted ---------------------------------------------------------------------- Recommendations  Recommend follow-up ultrasound examination if desired to  complete the fetal anatomy between the patient and provider ----------------------------------------------------------------------                   Ledon Snare, MD Electronically Signed Final Report   07/27/2017 08:29 am ----------------------------------------------------------------------   Current scheduled medications . docusate sodium  100 mg Oral Daily  . prenatal multivitamin  1 tablet Oral Q1200  . sodium chloride flush  3 mL Intravenous Q12H    I have reviewed the patient's current medications.  ASSESSMENT: Principal Problem:   Vasa previa Active Problems:   Vaginal bleeding in pregnancy   PLAN: Will continue inpatient close observation. Reassuring FHR for now; s/p betamethasone. Appreciate NICU consult, patient will have tour today Bleeding precautions reviewed Cesarean delivery indicated if needed Continue routine antenatal care.   Jaynie Collins, MD, FACOG Attending Obstetrician & Gynecologist Faculty Practice, [facility]

## 2017-07-28 NOTE — Progress Notes (Signed)
Patient ID: Margaret Knight, female   DOB: January 31, 1986, 31 y.o.   MRN: [id number] FACULTY PRACTICE ANTEPARTUM(COMPREHENSIVE) NOTE  SCOTLYNN NOYES is a 31 y.o. [id number] at 53w5dwho is admitted for vaginal bleeding with vasa previa.   Fetal presentation is cephalic. Length of Stay:  4  Days  Subjective:  Patient reports the fetal movement as active. Patient reports uterine contraction  activity as none. Patient reports  vaginal bleeding as none. Patient describes fluid per vagina as None.  Vitals:  Blood pressure (!) 99/59, pulse 84, temperature 98.5 F (36.9 C), temperature source Oral, resp. rate 17, height  (1.702 m), weight 73.5 kg (162 lb), last menstrual period 09/08/2016, SpO2 100 %, unknown if currently breastfeeding. Physical Examination:  General appearance - alert, well appearing, and in no distress Heart - normal rate and regular rhythm Abdomen - soft, nontender, nondistended Fundal Height:  size equals dates Cervical Exam: Not evaluated. . Extremities: extremities normal, atraumatic, no cyanosis or edema and Homans sign is negative, no sign of DVT with DTRs  Membranes:intact  Fetal Monitoring:     Fetal Heart Rate A  Mode External filed at 07/27/2017 2220  Baseline Rate (A) 145 bpm filed at 07/27/2017 2220  Variability 6-25 BPM filed at 07/27/2017 2220  Accelerations 10 x 10 filed at 07/27/2017 2220  Decelerations Variable filed at 07/27/2017 2220  Multiple birth? N filed at 07/27/2017 2220     Labs:  No results found for this or any previous visit (from the past 24 hour(s)).    Medications:  Scheduled . docusate sodium  100 mg Oral Daily  . prenatal multivitamin  1 tablet Oral Q1200  . sodium chloride flush  3 mL Intravenous Q12H   I have reviewed the patient's current medications.  ASSESSMENT: Patient Active Problem List   Diagnosis Date Noted  . Vaginal bleeding in pregnancy 07/26/2017  . Vasa previa 07/24/2017    PLAN: Continue expectant  management in hospital   Scheryl Darter 07/28/2017,6:32 AM

## 2017-07-28 NOTE — Plan of Care (Addendum)
Problem: Health Behavior/Discharge Planning: Goal: Ability to manage health-related needs will improve Outcome: Completed/Met Date Met: 07/28/17 Patient accepting of plan of care and routine of NST and bedrest.   Problem: Pain Managment: Goal: General experience of comfort will improve Outcome: Progressing Patient denies pain, experiencing occasional "cramps" in lower back. None noted on EFM.  Problem: Physical Regulation: Goal: Ability to maintain clinical measurements within normal limits will improve Outcome: Completed/Met Date Met: 07/28/17 Vital signs stable. Patient denies headache, resting without discomfort. Goal: Will remain free from infection Outcome: Completed/Met Date Met: 07/28/17 Patient is afebrile, denies leaking of fluid.

## 2017-07-29 NOTE — Progress Notes (Signed)
Patient ID: Margaret Knight, female   DOB: 08/31/1986, 31 y.o.   MRN: [id number] ACULTY PRACTICE ANTEPARTUM COMPREHENSIVE PROGRESS NOTE  Margaret Knight is a 31 y.o. [id number] at [redacted]w[redacted]d  who is admitted for vaginal bleeding secondary to vasa previa..   Fetal presentation is cephalic. Length of Stay:  5  Days  Subjective: Pt without complaints this morning. Denies any vaginal bleeding, cramps, pressure or LOF. + FM. Tolerating diet.    Vitals:  Blood pressure (!) 108/56, pulse 75, temperature 98.4 F (36.9 C), temperature source Oral, resp. rate 18, height  (1.702 m), weight 73.5 kg (162 lb), last menstrual period 09/08/2016, SpO2 98 %, unknown if currently breastfeeding.   Physical Examination: Lungs clear Heart RRR Abd soft + BS gravid non tender Ext non tender  Fetal Monitoring:  Baseline: 140-150's bpm  Labs:  No results found for this or any previous visit (from the past 24 hour(s)).  Imaging Studies:    none   Medications:  Scheduled . docusate sodium  100 mg Oral Daily  . prenatal multivitamin  1 tablet Oral Q1200  . sodium chloride flush  3 mL Intravenous Q12H   I have reviewed the patient's current medications.  ASSESSMENT: IUP 31 6/7 weeks Vaginal bleeding secondary to vasa previa Unwanted fertility, desires BTL  PLAN: Stable Continue routine antenatal care.   Hermina Staggers 07/29/2017,6:10 AM

## 2017-07-30 DIAGNOSIS — Z3009 Encounter for other general counseling and advice on contraception: Secondary | ICD-10-CM

## 2017-07-30 HISTORY — DX: Encounter for other general counseling and advice on contraception: Z30.09

## 2017-07-30 LAB — CBC
HEMATOCRIT: 32.1 % — AB (ref 36.0–46.0)
HEMOGLOBIN: 10.3 g/dL — AB (ref 12.0–15.0)
MCH: 27 pg (ref 26.0–34.0)
MCHC: 32.1 g/dL (ref 30.0–36.0)
MCV: 84 fL (ref 78.0–100.0)
Platelets: 352 10*3/uL (ref 150–400)
RBC: 3.82 MIL/uL — ABNORMAL LOW (ref 3.87–5.11)
RDW: 15.8 % — ABNORMAL HIGH (ref 11.5–15.5)
WBC: 8.5 10*3/uL (ref 4.0–10.5)

## 2017-07-30 LAB — TYPE AND SCREEN
ABO/RH(D): O POS
Antibody Screen: NEGATIVE

## 2017-07-30 NOTE — Progress Notes (Signed)
FACULTY PRACTICE ANTEPARTUM COMPREHENSIVE PROGRESS NOTE  Margaret Knight is a 31 y.o. [id number] at [redacted]w[redacted]d who is admitted for vasa previa and bleeding.  Estimated Date of Delivery: 10/22/17 Fetal presentation is unsure.  Length of Stay:  6 Days. Admitted 07/24/2017  Subjective: No active bleeding and no other complaints Patient reports good fetal movement.  She reports no uterine contractions, no bleeding and no loss of fluid per vagina.  Vitals:  Blood pressure (!) 110/57, pulse 78, temperature 98.3 F (36.8 C), temperature source Oral, resp. rate 16, height  (1.702 m), weight 162 lb (73.5 kg), last menstrual period 09/08/2016, SpO2 100 %, unknown if currently breastfeeding. Physical Examination: CONSTITUTIONAL: Well-developed, well-nourished female in no acute distress.  HENT:  Normocephalic, atraumatic, External right and left ear normal. Oropharynx is clear and moist EYES: Conjunctivae and EOM are normal. Pupils are equal, round, and reactive to light. No scleral icterus.  NECK: Normal range of motion, supple, no masses SKIN: Skin is warm and dry. No rash noted. Not diaphoretic. No erythema. No pallor. NEUROLGIC: Alert and oriented to person, place, and time. Normal reflexes, muscle tone coordination. No cranial nerve deficit noted. PSYCHIATRIC: Normal mood and affect. Normal behavior. Normal judgment and thought content. CARDIOVASCULAR: Normal heart rate noted, regular rhythm RESPIRATORY: Effort and breath sounds normal, no problems with respiration noted MUSCULOSKELETAL: Normal range of motion. No edema and no tenderness. 2+ distal pulses. ABDOMEN: Soft, nontender, nondistended, gravid. CERVIX:  Old blood on pad  Fetal monitoring: FHR: 145 bpm, Variability: moderate, Accelerations: 10 x 10 Present, Decelerations: Absent  Uterine activity: No contractions  Results for orders placed or performed during the hospital encounter of 07/24/17 (from the past 48 hour(s))  Type and  screen     Status: None   Collection Time: 07/30/17  5:18 AM  Result Value Ref Range   ABO/RH(D) O POS    Antibody Screen NEG    Sample Expiration 08/02/2017   CBC     Status: Abnormal   Collection Time: 07/30/17  5:18 AM  Result Value Ref Range   WBC 8.5 4.0 - 10.5 K/uL   RBC 3.82 (L) 3.87 - 5.11 MIL/uL   Hemoglobin 10.3 (L) 12.0 - 15.0 g/dL   HCT 46.9 (L) 62.9 - 52.8 %   MCV 84.0 78.0 - 100.0 fL   MCH 27.0 26.0 - 34.0 pg   MCHC 32.1 30.0 - 36.0 g/dL   RDW 41.3 (H) 24.4 - 01.0 %   Platelets 352 150 - 400 K/uL    No results found.  Current scheduled medications . docusate sodium  100 mg Oral Daily  . prenatal multivitamin  1 tablet Oral Q1200  . sodium chloride flush  3 mL Intravenous Q12H    I have reviewed the patient's current medications.  ASSESSMENT: Principal Problem:   Vasa previa Active Problems:   Vaginal bleeding in pregnancy   Unwanted fertility, desires BTS   PLAN: Will continue inpatient close observation. Reassuring FHR for now; s/p betamethasone. Bleeding precautions reviewed Type and screen, CBC q72h Cesarean delivery indicated if needed; BTS to be done at time of cesarean delivery Continue routine antenatal care.   Jaynie Collins, MD, FACOG Attending Obstetrician & Gynecologist Faculty Practice, [facility]

## 2017-07-31 NOTE — Progress Notes (Signed)
FACULTY PRACTICE ANTEPARTUM COMPREHENSIVE PROGRESS NOTE  Margaret Knight is a 31 y.o. [id number] at [redacted]w[redacted]d who is admitted for vasa previa and bleeding.  Estimated Date of Delivery: 10/22/17 Fetal presentation is unsure.  Length of Stay:  7 Days. Admitted 07/24/2017  Subjective: No active bleeding and no other complaints,  Doing well. Patient reports good fetal movement.  She reports no uterine contractions, no bleeding and no loss of fluid per vagina.  Vitals:  Blood pressure (!) 101/59, pulse 81, temperature 98.8 F (37.1 C), temperature source Oral, resp. rate 18, height  (1.702 m), weight 162 lb (73.5 kg), last menstrual period 09/08/2016, SpO2 99 %, unknown if currently breastfeeding. Physical Examination: CONSTITUTIONAL: Well-developed, well-nourished female in no acute distress.  HENT:  Normocephalic, atraumatic, External right and left ear normal. Oropharynx is clear and moist EYES: Conjunctivae and EOM are normal. Pupils are equal, round, and reactive to light. No scleral icterus.  NECK: Normal range of motion, supple, no masses SKIN: Skin is warm and dry. No rash noted. Not diaphoretic. No erythema. No pallor. NEUROLGIC: Alert and oriented to person, place, and time. Normal reflexes, muscle tone coordination. No cranial nerve deficit noted. PSYCHIATRIC: Normal mood and affect. Normal behavior. Normal judgment and thought content. CARDIOVASCULAR: Normal heart rate noted, regular rhythm RESPIRATORY: Effort and breath sounds normal, no problems with respiration noted MUSCULOSKELETAL: Normal range of motion. No edema and no tenderness. 2+ distal pulses. ABDOMEN: Soft, nontender, nondistended, gravid. CERVIX:  Old blood on pad  Fetal monitoring: FHR: 145 bpm, Variability: moderate, Accelerations: 10 x 10 Present, Decelerations: Absent  Uterine activity: No contractions  Results for orders placed or performed during the hospital encounter of 07/24/17 (from the past 48 hour(s))   Type and screen     Status: None   Collection Time: 07/30/17  5:18 AM  Result Value Ref Range   ABO/RH(D) O POS    Antibody Screen NEG    Sample Expiration 08/02/2017   CBC     Status: Abnormal   Collection Time: 07/30/17  5:18 AM  Result Value Ref Range   WBC 8.5 4.0 - 10.5 K/uL   RBC 3.82 (L) 3.87 - 5.11 MIL/uL   Hemoglobin 10.3 (L) 12.0 - 15.0 g/dL   HCT 86.5 (L) 78.4 - 69.6 %   MCV 84.0 78.0 - 100.0 fL   MCH 27.0 26.0 - 34.0 pg   MCHC 32.1 30.0 - 36.0 g/dL   RDW 29.5 (H) 28.4 - 13.2 %   Platelets 352 150 - 400 K/uL    No results found.  Current scheduled medications . docusate sodium  100 mg Oral Daily  . prenatal multivitamin  1 tablet Oral Q1200  . sodium chloride flush  3 mL Intravenous Q12H    I have reviewed the patient's current medications.  ASSESSMENT: Principal Problem:   Vasa previa Active Problems:   Vaginal bleeding in pregnancy   Unwanted fertility, desires BTS   PLAN: Will continue inpatient close observation. Reassuring FHR for now; s/p betamethasone. Bleeding precautions reviewed Type and screen, CBC q72h Cesarean delivery indicated if needed; BTS to be done at time of cesarean delivery Continue routine antenatal care.   Jaynie Collins, MD, FACOG Attending Obstetrician & Gynecologist Faculty Practice, [facility]

## 2017-08-01 NOTE — Progress Notes (Signed)
FACULTY PRACTICE ANTEPARTUM COMPREHENSIVE PROGRESS NOTE  Margaret Knight is a 31 y.o. [id number] at [redacted]w[redacted]d who is admitted for vasa previa and bleeding.  Estimated Date of Delivery: 10/22/17 Fetal presentation is unsure.  Length of Stay:  8 Days. Admitted 07/24/2017  Subjective: No complaints.  Doing well. Patient reports good fetal movement.  She reports no uterine contractions, no bleeding and no loss of fluid per vagina.  Vitals:  Blood pressure 107/67, pulse 87, temperature 98.3 F (36.8 C), temperature source Oral, resp. rate 18, height  (1.702 m), weight 162 lb (73.5 kg), last menstrual period 09/08/2016, SpO2 99 %, unknown if currently breastfeeding. Physical Examination: CONSTITUTIONAL: Well-developed, well-nourished female in no acute distress.  HENT:  Normocephalic, atraumatic, External right and left ear normal. Oropharynx is clear and moist EYES: Conjunctivae and EOM are normal. Pupils are equal, round, and reactive to light. No scleral icterus.  NECK: Normal range of motion, supple, no masses SKIN: Skin is warm and dry. No rash noted. Not diaphoretic. No erythema. No pallor. NEUROLGIC: Alert and oriented to person, place, and time. Normal reflexes, muscle tone coordination. No cranial nerve deficit noted. PSYCHIATRIC: Normal mood and affect. Normal behavior. Normal judgment and thought content. CARDIOVASCULAR: Normal heart rate noted, regular rhythm RESPIRATORY: Effort and breath sounds normal, no problems with respiration noted MUSCULOSKELETAL: Normal range of motion. No edema and no tenderness. 2+ distal pulses. ABDOMEN: Soft, nontender, nondistended, gravid. CERVIX:   Deferred  Fetal monitoring: FHR: 155 bpm, Variability: moderate, Accelerations: 10 x 10 Present, Decelerations: Absent  Uterine activity: No contractions  No results found for this or any previous visit (from the past 48 hour(s)).  No results found.  Current scheduled medications . docusate sodium   100 mg Oral Daily  . prenatal multivitamin  1 tablet Oral Q1200  . sodium chloride flush  3 mL Intravenous Q12H    I have reviewed the patient's current medications.  ASSESSMENT: Principal Problem:   Vasa previa Active Problems:   Vaginal bleeding in pregnancy   Unwanted fertility, desires BTS   PLAN: Will continue inpatient close observation. Reassuring FHR for now; s/p betamethasone. Bleeding precautions reviewed Type and screen, CBC q72h Cesarean delivery indicated if needed; BTS to be done at time of cesarean delivery Continue routine antenatal care.   Jaynie Collins, MD, FACOG Attending Obstetrician & Gynecologist Faculty Practice, [facility]

## 2017-08-02 LAB — TYPE AND SCREEN
ABO/RH(D): O POS
Antibody Screen: NEGATIVE

## 2017-08-02 LAB — CBC
HEMATOCRIT: 34 % — AB (ref 36.0–46.0)
Hemoglobin: 10.8 g/dL — ABNORMAL LOW (ref 12.0–15.0)
MCH: 26.8 pg (ref 26.0–34.0)
MCHC: 31.8 g/dL (ref 30.0–36.0)
MCV: 84.4 fL (ref 78.0–100.0)
PLATELETS: 325 10*3/uL (ref 150–400)
RBC: 4.03 MIL/uL (ref 3.87–5.11)
RDW: 16 % — AB (ref 11.5–15.5)
WBC: 8.5 10*3/uL (ref 4.0–10.5)

## 2017-08-02 NOTE — Progress Notes (Signed)
Initial Nutrition Assessment  DOCUMENTATION CODES:  Not applicable  INTERVENTION:  Regular diet May order double protein portions, snacks TID and from retail  NUTRITION DIAGNOSIS:  Increased nutrient needs related to  (pregnancy and fetal growth requirements) as evidenced by  (28 weeks IUP). GOAL:   Patient will meet greater than or equal to 90% of their needs  MONITOR:  Weight trends  REASON FOR ASSESSMENT:  Antenatal   ASSESSMENT:   28 3/7 weeks, adm with vaginal bleeding and vasa previa. pre-preg weight 146 lbs, BMI 22.8.   16 lb weight gain  Reports good appetite, diet tol well  Diet Order:  Diet regular Room service appropriate? Yes; Fluid consistency: Thin  Skin:  Reviewed, no issues  Height:   Ht Readings from Last 1 Encounters:  07/24/17  (1.702 m)   Weight:   Wt Readings from Last 1 Encounters:  07/24/17 162 lb (73.5 kg)    Ideal Body Weight:     BMI:  Body mass index is 25.37 kg/m.  Estimated Nutritional Needs:   Kcal:  1900-2100  Protein:  85-95 g  Fluid:  2.2 L  EDUCATION NEEDS:   No education needs identified at this time  Inez Pilgrim.Odis Luster LDN Neonatal Nutrition Support Specialist/RD III Pager [phone]      Phone [phone]

## 2017-08-02 NOTE — Progress Notes (Signed)
Pt. Woke up and requested to be placed on monitor, for reassurance. Pt. Asked to void, and drink juice. Pt. Placed on monitor at 536 per her request. At 0547 monitor readjusted. Upon entry to room pt. Observed to be touching her monitor and toco. I readjusted monitor and heard/saw on monitor FHR. Pt. Very concerned at read on monitor and indicated it was imperative I call the MD. I provided pt. Reassurance and explained that I was looking at her strip and baseline not the numbers on her monitor. She refused to listen to rationale.  Dr. Despina Hidden called we discussed strip. Dr. Despina Hidden will speak with pt. During rounds.  Had second RN review strip with me both current and back to 07/27/17. This strip seems to be consecutive and reassuring in comparison. Consulting civil engineer and myself to room. Pt. Asked [place] to leave "I'm on an important call" Explained very briefly but quietly that we wanted to speak with her. Return to room with Charge RN, further discussed monitoring of baseline, discussed provider discussion, discussed review of previous strips. Pt. Said she was of understanding and requested to be taken off the monitor.
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
## 2017-08-03 NOTE — Progress Notes (Signed)
FACULTY PRACTICE ANTEPARTUM COMPREHENSIVE PROGRESS NOTE  Margaret Knight is a 31 y.o. [id number] at [redacted]w[redacted]d who is admitted for vasa previa and bleeding.  Estimated Date of Delivery: 10/22/17 Fetal presentation is unsure.  Length of Stay:  10 Days. Admitted 07/24/2017  Subjective: No complaints.  Had mild contractions overnight. No bleeding. Doing well. Patient reports good fetal movement.  She reports no uterine contractions, no bleeding and no loss of fluid per vagina.  Vitals:  Blood pressure (!) 109/51, pulse 88, temperature 98.9 F (37.2 C), temperature source Oral, resp. rate 18, height  (1.702 m), weight 162 lb (73.5 kg), last menstrual period 09/08/2016, SpO2 98 %, unknown if currently breastfeeding. Physical Examination: CONSTITUTIONAL: Well-developed, well-nourished female in no acute distress.  HENT:  Normocephalic, atraumatic, External right and left ear normal. Oropharynx is clear and moist EYES: Conjunctivae and EOM are normal. Pupils are equal, round, and reactive to light. No scleral icterus.  NECK: Normal range of motion, supple, no masses SKIN: Skin is warm and dry. No rash noted. Not diaphoretic. No erythema. No pallor. NEUROLGIC: Alert and oriented to person, place, and time. Normal reflexes, muscle tone coordination. No cranial nerve deficit noted. PSYCHIATRIC: Normal mood and affect. Normal behavior. Normal judgment and thought content. CARDIOVASCULAR: Normal heart rate noted, regular rhythm RESPIRATORY: Effort and breath sounds normal, no problems with respiration noted MUSCULOSKELETAL: Normal range of motion. No edema and no tenderness. 2+ distal pulses. ABDOMEN: Soft, nontender, nondistended, gravid. CERVIX:   Deferred  Fetal monitoring: FHR: 140 bpm, Variability: moderate, Accelerations: Present, Decelerations: Absent  Uterine activity: No contractions  Results for orders placed or performed during the hospital encounter of 07/24/17 (from the past 48 hour(s))   Type and screen     Status: None   Collection Time: 08/02/17  5:47 AM  Result Value Ref Range   ABO/RH(D) O POS    Antibody Screen NEG    Sample Expiration 08/05/2017   CBC     Status: Abnormal   Collection Time: 08/02/17  5:47 AM  Result Value Ref Range   WBC 8.5 4.0 - 10.5 K/uL   RBC 4.03 3.87 - 5.11 MIL/uL   Hemoglobin 10.8 (L) 12.0 - 15.0 g/dL   HCT 78.2 (L) 95.6 - 21.3 %   MCV 84.4 78.0 - 100.0 fL   MCH 26.8 26.0 - 34.0 pg   MCHC 31.8 30.0 - 36.0 g/dL   RDW 08.6 (H) 57.8 - 46.9 %   Platelets 325 150 - 400 K/uL    No results found.  Current scheduled medications . docusate sodium  100 mg Oral Daily  . prenatal multivitamin  1 tablet Oral Q1200  . sodium chloride flush  3 mL Intravenous Q12H    I have reviewed the patient's current medications.  ASSESSMENT: Principal Problem:   Vasa previa Active Problems:   Vaginal bleeding in pregnancy   Unwanted fertility, desires BTS   PLAN: Will continue inpatient close observation. Reassuring FWB Bleeding precautions reviewed, Type and screen, CBC q72h Cesarean delivery indicated if needed; BTS to be done at time of cesarean delivery Continue routine antenatal care.   Jaynie Collins, MD, FACOG Attending Obstetrician & Gynecologist Faculty Practice, [facility]

## 2017-08-04 DIAGNOSIS — O4692 Antepartum hemorrhage, unspecified, second trimester: Secondary | ICD-10-CM

## 2017-08-04 NOTE — Progress Notes (Signed)
FACULTY PRACTICE ANTEPARTUM(COMPREHENSIVE) NOTE  Margaret Knight is a 31 y.o. [id number] at [redacted]w[redacted]d  who is admitted for vasa previa.   Fetal presentation is unsure. Length of Stay:  11  Days pt comfortable and has good attitude about prolonged stay in hospital.  Subjective: No complaints, no further contractions. Patient reports the fetal movement as active. Patient reports uterine contraction  activity as none. Patient reports  vaginal bleeding as none. Patient describes fluid per vagina as None.  Vitals:  Blood pressure (!) 97/48, pulse 83, temperature 98.7 F (37.1 C), temperature source Oral, resp. rate 18, height  (1.702 m), weight 162 lb (73.5 kg), last menstrual period 09/08/2016, SpO2 98 %, unknown if currently breastfeeding. Physical Examination:  General appearance - alert, well appearing, and in no distress, oriented to person, place, and time and normal appearing weight Heart - normal rate and regular rhythm Abdomen - soft, nontender, nondistended Fundal Height:  size equals dates Cervical Exam: Not evaluated. and f fetal presentation is unsure. Extremities: extremities normal, atraumatic, no cyanosis or edema and Homans sign is negative, no sign of DVT with DTRs 2+ bilaterally Membranes:intact  Fetal Monitoring:    Labs:  No results found for this or any previous visit (from the past 24 hour(s)).  Imaging Studies:     Currently EPIC will not allow sonographic studies to automatically populate into notes.  In the meantime, copy and paste results into note or free text.  Medications:  Scheduled . docusate sodium  100 mg Oral Daily  . prenatal multivitamin  1 tablet Oral Q1200  . sodium chloride flush  3 mL Intravenous Q12H   I have reviewed the patient's current medications.  ASSESSMENT: Patient Active Problem List   Diagnosis Date Noted  . Unwanted fertility, desires BTS 07/30/2017  . Vaginal bleeding in pregnancy 07/26/2017  . Vasa previa 07/24/2017     PLAN: Will continue inpatient close observation. Reassuring FWB Bleeding precautions reviewed, Type and screen, CBC q72h Cesarean delivery indicated if needed; BTS to be done at time of cesarean delivery Continue routine antenatal care.   Tilda Burrow 08/04/2017,7:49 AM    Patient ID: Margaret Knight, female   DOB: 08/25/1986, 31 y.o.   MRN: [id number]

## 2017-08-05 LAB — CBC
HEMATOCRIT: 34 % — AB (ref 36.0–46.0)
Hemoglobin: 10.8 g/dL — ABNORMAL LOW (ref 12.0–15.0)
MCH: 26.9 pg (ref 26.0–34.0)
MCHC: 31.8 g/dL (ref 30.0–36.0)
MCV: 84.8 fL (ref 78.0–100.0)
Platelets: 317 10*3/uL (ref 150–400)
RBC: 4.01 MIL/uL (ref 3.87–5.11)
RDW: 16.4 % — AB (ref 11.5–15.5)
WBC: 7.7 10*3/uL (ref 4.0–10.5)

## 2017-08-05 MED ORDER — FLUCONAZOLE 100 MG PO TABS
100.0000 mg | ORAL_TABLET | ORAL | Status: AC
  Administered 2017-08-05: 100 mg via ORAL
  Filled 2017-08-05 (×2): qty 1

## 2017-08-05 NOTE — Progress Notes (Signed)
FACULTY PRACTICE ANTEPARTUM COMPREHENSIVE PROGRESS NOTE  Margaret Knight is a 31 y.o. [id number] at [redacted]w[redacted]d who is admitted for vasa previa and bleeding.  Estimated Date of Delivery: 10/22/17 Fetal presentation is unsure.  Length of Stay:  12 Days. Admitted 07/24/2017  Subjective: No bleeding. Doing well. Patient reports good fetal movement.  She reports no uterine contractions, no bleeding and no loss of fluid per vagina.  Vitals:  Blood pressure (!) 109/53, pulse 89, temperature 98.4 F (36.9 C), temperature source Oral, resp. rate 18, height  (1.702 m), weight 162 lb (73.5 kg), last menstrual period 09/08/2016, SpO2 99 %, unknown if currently breastfeeding. Physical Examination: CONSTITUTIONAL: Well-developed, well-nourished female in no acute distress.  HENT:  Normocephalic, atraumatic, External right and left ear normal. Oropharynx is clear and moist EYES: Conjunctivae and EOM are normal. Pupils are equal, round, and reactive to light. No scleral icterus.  NECK: Normal range of motion, supple, no masses SKIN: Skin is warm and dry. No rash noted. Not diaphoretic. No erythema. No pallor. NEUROLGIC: Alert and oriented to person, place, and time. Normal reflexes, muscle tone coordination. No cranial nerve deficit noted. PSYCHIATRIC: Normal mood and affect. Normal behavior. Normal judgment and thought content. CARDIOVASCULAR: Normal heart rate noted, regular rhythm RESPIRATORY: Effort and breath sounds normal, no problems with respiration noted MUSCULOSKELETAL: Normal range of motion. No edema and no tenderness. 2+ distal pulses. ABDOMEN: Soft, nontender, nondistended, gravid. CERVIX:   Deferred  Fetal monitoring: FHR: 145 bpm, Variability: moderate, Accelerations: Present, Decelerations: Absent  Uterine activity: No contractions  Results for orders placed or performed during the hospital encounter of 07/24/17 (from the past 48 hour(s))  Type and screen     Status: None (Preliminary  result)   Collection Time: 08/05/17  5:11 AM  Result Value Ref Range   ABO/RH(D) O POS    Antibody Screen PENDING    Sample Expiration 08/08/2017   CBC     Status: Abnormal   Collection Time: 08/05/17  5:11 AM  Result Value Ref Range   WBC 7.7 4.0 - 10.5 K/uL   RBC 4.01 3.87 - 5.11 MIL/uL   Hemoglobin 10.8 (L) 12.0 - 15.0 g/dL   HCT 29.5 (L) 28.4 - 13.2 %   MCV 84.8 78.0 - 100.0 fL   MCH 26.9 26.0 - 34.0 pg   MCHC 31.8 30.0 - 36.0 g/dL   RDW 44.0 (H) 10.2 - 72.5 %   Platelets 317 150 - 400 K/uL    No results found.  Current scheduled medications . docusate sodium  100 mg Oral Daily  . prenatal multivitamin  1 tablet Oral Q1200  . sodium chloride flush  3 mL Intravenous Q12H    I have reviewed the patient's current medications.  ASSESSMENT: Principal Problem:   Vasa previa Active Problems:   Vaginal bleeding in pregnancy   Unwanted fertility, desires BTS   PLAN: Will continue inpatient close observation. Reassuring fetal well being, s/p NICU consult and betamethasone Bleeding precautions reviewed; continue checking Type and screen, CBC q72h Cesarean delivery indicated if needed; BTS to be done at time of cesarean delivery Continue routine antenatal care.   Jaynie Collins, MD, FACOG Attending Obstetrician & Gynecologist Faculty Practice, [facility]

## 2017-08-06 DIAGNOSIS — O9989 Other specified diseases and conditions complicating pregnancy, childbirth and the puerperium: Secondary | ICD-10-CM

## 2017-08-06 DIAGNOSIS — R8271 Bacteriuria: Secondary | ICD-10-CM

## 2017-08-06 DIAGNOSIS — T7491XA Unspecified adult maltreatment, confirmed, initial encounter: Secondary | ICD-10-CM

## 2017-08-06 DIAGNOSIS — O99891 Other specified diseases and conditions complicating pregnancy: Secondary | ICD-10-CM

## 2017-08-06 DIAGNOSIS — O09523 Supervision of elderly multigravida, third trimester: Secondary | ICD-10-CM

## 2017-08-06 HISTORY — DX: Unspecified adult maltreatment, confirmed, initial encounter: T74.91XA

## 2017-08-06 LAB — FERRITIN: Ferritin: 18 ng/mL (ref 11–307)

## 2017-08-06 MED ORDER — TETANUS-DIPHTH-ACELL PERTUSSIS 5-2.5-18.5 LF-MCG/0.5 IM SUSP
0.5000 mL | Freq: Once | INTRAMUSCULAR | Status: AC
  Administered 2017-08-06: 0.5 mL via INTRAMUSCULAR
  Filled 2017-08-06: qty 0.5

## 2017-08-06 MED ORDER — DOCUSATE SODIUM 100 MG PO CAPS
100.0000 mg | ORAL_CAPSULE | Freq: Two times a day (BID) | ORAL | Status: DC | PRN
  Administered 2017-08-07: 100 mg via ORAL
  Filled 2017-08-06: qty 1

## 2017-08-06 NOTE — Progress Notes (Signed)
Patient ID: Margaret Knight, female   DOB: September 13, 1986, 31 y.o.   MRN: [id number] FACULTY PRACTICE ANTEPARTUM(COMPREHENSIVE) NOTE  Margaret Knight is a 31 y.o. [id number] with Estimated Date of Delivery: 10/22/17    [redacted]w[redacted]d  who is admitted for vasa previa with bleeding  Fetal presentation has been variable with of course umbilical cord leading.    Fetal presentation is unsure. Length of Stay:  13  Days  Date of admission:07/24/2017  Subjective:  Patient reports the fetal movement as active. Patient reports uterine contraction  activity as irregular, every 20 minutes. Patient reports  vaginal bleeding as none. Patient describes fluid per vagina as None.  Vitals:  Blood pressure (!) 98/56, pulse 81, temperature 98.2 F (36.8 C), temperature source Oral, resp. rate 17, height  (1.702 m), weight 162 lb (73.5 kg), last menstrual period 09/08/2016, SpO2 99 %, unknown if currently breastfeeding. Vitals:   08/05/17 1930 08/06/17 0036 08/06/17 0558 08/06/17 0616  BP:  100/64 (!) 90/55 (!) 98/56  Pulse: 89 85 79 81  Resp: 18  17   Temp: 99.7 F (37.6 C)  98.2 F (36.8 C)   TempSrc: Oral  Oral   SpO2: 100% 99% 99% 99%  Weight:      Height:       Physical Examination:  General appearance - alert, well appearing, and in no distress Abdomen - soft, nontender, nondistended, no masses or organomegaly Fundal Height:  size equals dates Pelvic Exam:  deferred Cervical Exam: deferred Extremities: extremities normal, atraumatic, no cyanosis or edema with DTRs 2+ bilaterally Membranes:intact  Fetal Monitoring:  Reassuring fetal testing     Labs:  No results found for this or any previous visit (from the past 24 hour(s)).  Imaging Studies:      Medications:  Scheduled . docusate sodium  100 mg Oral Daily  . fluconazole  100 mg Oral Q72H  . prenatal multivitamin  1 tablet Oral Q1200  . sodium chloride flush  3 mL Intravenous Q12H   I have reviewed the patient's current  medications.  ASSESSMENT: [id number] [redacted]w[redacted]d Estimated Date of Delivery: 10/22/17  Patient Active Problem List   Diagnosis Date Noted  . Unwanted fertility, desires BTS 07/30/2017  . Vaginal bleeding in pregnancy 07/26/2017  . Vasa previa 07/24/2017    PLAN: Continue close inpatient observation until delivery, most likely Caesarean delivery Continue current monitoring schedule  Tannar Broker H 08/06/2017,7:34 AM

## 2017-08-06 NOTE — Progress Notes (Signed)
NST Note Date: 08/06/2017 Gestational Age: 31/0 FHT: 140 baseline, positive accelerations, negative deceleration, moderate variability Toco: quiet Time: 20 minutes  A/P: rNST. Continue with current plan of care  Cornelia Copa MD Attending Center for [facility])

## 2017-08-07 DIAGNOSIS — O4692 Antepartum hemorrhage, unspecified, second trimester: Secondary | ICD-10-CM

## 2017-08-07 DIAGNOSIS — O99810 Abnormal glucose complicating pregnancy: Secondary | ICD-10-CM | POA: Clinically undetermined

## 2017-08-07 LAB — CULTURE, OB URINE: Culture: NO GROWTH

## 2017-08-07 LAB — GLUCOSE TOLERANCE, 1 HOUR: GLUCOSE 1 HOUR GTT: 136 mg/dL (ref 70–140)

## 2017-08-07 LAB — RPR: RPR: NONREACTIVE

## 2017-08-07 LAB — HIV ANTIBODY (ROUTINE TESTING W REFLEX): HIV Screen 4th Generation wRfx: NONREACTIVE

## 2017-08-07 MED ORDER — FERROUS GLUCONATE 324 (38 FE) MG PO TABS
324.0000 mg | ORAL_TABLET | Freq: Every day | ORAL | Status: DC
  Administered 2017-08-10 – 2017-08-17 (×8): 324 mg via ORAL
  Filled 2017-08-07 (×11): qty 1
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
## 2017-08-08 LAB — TYPE AND SCREEN
ABO/RH(D): O POS
ABO/RH(D): O POS
ANTIBODY SCREEN: NEGATIVE
Antibody Screen: NEGATIVE

## 2017-08-08 MED ORDER — BETAMETHASONE SOD PHOS & ACET 6 (3-3) MG/ML IJ SUSP
12.0000 mg | INTRAMUSCULAR | Status: AC
  Administered 2017-08-08 – 2017-08-09 (×2): 12 mg via INTRAMUSCULAR
  Filled 2017-08-08 (×2): qty 2
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
## 2017-08-08 NOTE — Progress Notes (Signed)
Pt refusing 3 hour gtt and CBG checks. Pt educated on risk of high blood sugars with pregnancy.

## 2017-08-09 ENCOUNTER — Encounter (HOSPITAL_COMMUNITY): Payer: Self-pay
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
## 2017-08-13 ENCOUNTER — Encounter (HOSPITAL_COMMUNITY): Payer: Self-pay

## 2017-08-13 DIAGNOSIS — O09523 Supervision of elderly multigravida, third trimester: Secondary | ICD-10-CM

## 2017-08-13 DIAGNOSIS — O99810 Abnormal glucose complicating pregnancy: Secondary | ICD-10-CM

## 2017-08-13 LAB — GLUCOSE, CAPILLARY: GLUCOSE-CAPILLARY: 90 mg/dL (ref 65–99)
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
## 2017-08-14 LAB — GLUCOSE, CAPILLARY: Glucose-Capillary: 77 mg/dL (ref 65–99)

## 2017-08-14 NOTE — Progress Notes (Signed)
Patient refused TED hose 
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
## 2017-08-15 MED ORDER — LACTATED RINGERS IV BOLUS (SEPSIS)
500.0000 mL | Freq: Once | INTRAVENOUS | Status: AC
  Administered 2017-08-15: 999 mL via INTRAVENOUS

## 2017-08-15 MED ORDER — LACTATED RINGERS IV SOLN
INTRAVENOUS | Status: DC
  Administered 2017-08-15 (×2): 125 mL/h via INTRAVENOUS

## 2017-08-15 MED ORDER — TERBUTALINE SULFATE 1 MG/ML IJ SOLN
0.2500 mg | Freq: Once | INTRAMUSCULAR | Status: AC
  Administered 2017-08-15: 0.25 mg via SUBCUTANEOUS
  Filled 2017-08-15: qty 1
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
## 2017-08-15 NOTE — Progress Notes (Signed)
At 2357 patient c/o contractions with a pain score 10 out of 10 during a contraction.  Monitors applied.  Dr Adrian BlackwaterStinson notified patient contracting q3.5-4 minutes, lasting 50-60 seconds. Orders received.  IV started 500cc LR bolus given.  At 0109 remains contracting Terbutaline .25mg  SQ given at that time. After 5minutes patient stated not feeling any contractions, monitor showing UI. At 0200 patient stated not feeling any pain, according to monitor UI noted FHR reactive.  No vaginal bleeding noted.  Patient NPO since 0116.
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
## 2017-08-17 ENCOUNTER — Inpatient Hospital Stay (HOSPITAL_COMMUNITY): Payer: 59

## 2017-08-17 MED ORDER — DOCUSATE SODIUM 100 MG PO CAPS: 100.0000 mg | ORAL_CAPSULE | Freq: Two times a day (BID) | ORAL | 0 refills | Status: DC | PRN

## 2017-08-17 MED ORDER — FERROUS GLUCONATE 324 (38 FE) MG PO TABS: 324.0000 mg | ORAL_TABLET | Freq: Every day | ORAL | 3 refills | Status: DC

## 2017-08-17 NOTE — Progress Notes (Signed)
Pt refused CBG check  

## 2017-08-17 NOTE — Discharge Instructions (Signed)
Placenta Previa °Placenta previa is a condition in which the placenta implants in the lower part of the uterus in pregnant women. The placenta either partially or completely covers the opening to the cervix. This is a problem because the baby must pass through the cervix during delivery. There are three types of placenta previa: °· Marginal placenta previa. The placenta reaches within an inch (2.5 cm) of the cervical opening but does not cover it. °· Partial placenta previa. The placenta covers part of the cervical opening. °· Complete placenta previa. The placenta covers the entire cervical opening. ° °If the previa is marginal or partial and it is diagnosed in the first half of pregnancy, the placenta may move into a normal position as the pregnancy progresses and may no longer cover the cervix. It is important to keep all prenatal visits with your health care provider so you can be more closely monitored. °What are the causes? °The cause of this condition is not known. °What increases the risk? °This condition is more likely to develop in women who: °· Are carrying more than one baby (multiples). °· Have an abnormally shaped uterus. °· Have scars on the lining of the uterus. °· Have had surgeries involving the uterus, such as a cesarean delivery. °· Have delivered a baby before. °· Have a history of placenta previa. °· Have smoked or used cocaine during pregnancy. °· Are age 35 or older during pregnancy. ° °What are the signs or symptoms? °The main symptom of this condition is sudden, painless vaginal bleeding during the second half of pregnancy. The amount of bleeding can be very light at first, and it usually stops on its own. Heavier bleeding episodes may also happen. Some women with placenta previa may have no bleeding at all. °How is this diagnosed? °· This condition is diagnosed: °? From an ultrasound. This test uses sound waves to find where the placenta is located before you have any bleeding  episodes. °? During a checkup after vaginal bleeding is noticed. °· If you are diagnosed with a partial or complete previa, digital exams with fingers will generally be avoided. Your health care provider will still perform a speculum exam. °· If you did not have an ultrasound during your pregnancy, placenta previa may not be diagnosed until bleeding occurs during labor. °How is this treated? °Treatment for this condition may include: °· Decreased activity. °· Bed rest at home or in the hospital. °· Pelvic rest. Nothing is placed inside the vagina during pelvic rest. This means not having sex and not using tampons or douches. °· A blood transfusion to replace blood that you have lost (maternal blood loss). °· A cesarean delivery. This may be performed if: °? The bleeding is heavy and cannot be controlled. °? The placenta completely covers the cervix. °· Medicines to stop premature labor or to help the baby's lungs to mature. This treatment may be used if you need delivery before your pregnancy is full-term. ° °Your treatment will be decided based on: °· How much you are bleeding, or whether the bleeding has stopped. °· How far along you are in your pregnancy. °· The condition of your baby. °· The type of placenta previa that you have. ° °Follow these instructions at home: °· Get plenty of rest and lessen activity as told by your health care provider. °· Stay on bed rest for as long as told by your health care provider. °· Do not have sex, use tampons, use a douche, or place   anything inside of your vagina if your health care provider recommended pelvic rest. °· Take over-the-counter and prescription medicines as told by your health care provider. °· Keep all follow-up visits as told by your health care provider. This is important. °Get help right away if: °· You have vaginal bleeding, even if in small amounts and even if you have no pain. °· You have cramping or regular contractions. °· You have pain in your abdomen  or your lower back. °· You have a feeling of increased pressure in your pelvis. °· You have increased watery or bloody mucus from the vagina. °This information is not intended to replace advice given to you by your health care provider. Make sure you discuss any questions you have with your health care provider. °Document Released: 10/16/2005 Document Revised: 07/05/2016 Document Reviewed: 04/29/2016 °Elsevier Interactive Patient Education © 2018 [employer] ° °

## 2017-08-17 NOTE — Progress Notes (Signed)
Instructions given
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
## 2017-08-21 ENCOUNTER — Ambulatory Visit (INDEPENDENT_AMBULATORY_CARE_PROVIDER_SITE_OTHER): Payer: Self-pay | Admitting: *Deleted

## 2017-08-21 DIAGNOSIS — Z029 Encounter for administrative examinations, unspecified: Secondary | ICD-10-CM

## 2017-08-21 DIAGNOSIS — Z3493 Encounter for supervision of normal pregnancy, unspecified, third trimester: Secondary | ICD-10-CM

## 2017-08-21 NOTE — Progress Notes (Signed)
FMLA forms completed on 08/01/17 and charged.

## 2017-08-28 ENCOUNTER — Telehealth (HOSPITAL_COMMUNITY): Payer: Self-pay | Admitting: *Deleted

## 2017-08-28 ENCOUNTER — Encounter (HOSPITAL_COMMUNITY): Payer: Self-pay

## 2017-08-28 NOTE — Telephone Encounter (Signed)
Preadmission screen  

## 2017-08-29 ENCOUNTER — Encounter (HOSPITAL_COMMUNITY): Payer: Self-pay | Admitting: Certified Nurse Midwife

## 2017-08-29 ENCOUNTER — Inpatient Hospital Stay (HOSPITAL_COMMUNITY): Payer: 59

## 2017-08-29 ENCOUNTER — Inpatient Hospital Stay (HOSPITAL_COMMUNITY)
Admission: AD | Admit: 2017-08-29 | Discharge: 2017-09-04 | DRG: 787 | Disposition: A | Discharge status: Home / Self Care | Payer: 59 | Source: Ambulatory Visit | Attending: Obstetrics & Gynecology | Admitting: Obstetrics & Gynecology

## 2017-08-29 DIAGNOSIS — O321XX0 Maternal care for breech presentation, not applicable or unspecified: Secondary | ICD-10-CM | POA: Diagnosis present

## 2017-08-29 DIAGNOSIS — O43123 Velamentous insertion of umbilical cord, third trimester: Secondary | ICD-10-CM | POA: Diagnosis present

## 2017-08-29 DIAGNOSIS — Z3A32 32 weeks gestation of pregnancy: Secondary | ICD-10-CM | POA: Diagnosis not present

## 2017-08-29 DIAGNOSIS — O4413 Placenta previa with hemorrhage, third trimester: Secondary | ICD-10-CM | POA: Diagnosis present

## 2017-08-29 DIAGNOSIS — D62 Acute posthemorrhagic anemia: Secondary | ICD-10-CM | POA: Diagnosis not present

## 2017-08-29 DIAGNOSIS — O4403 Placenta previa specified as without hemorrhage, third trimester: Secondary | ICD-10-CM | POA: Diagnosis not present

## 2017-08-29 DIAGNOSIS — O4693 Antepartum hemorrhage, unspecified, third trimester: Secondary | ICD-10-CM | POA: Diagnosis not present

## 2017-08-29 DIAGNOSIS — O9081 Anemia of the puerperium: Secondary | ICD-10-CM | POA: Diagnosis not present

## 2017-08-29 DIAGNOSIS — O4433 Partial placenta previa with hemorrhage, third trimester: Secondary | ICD-10-CM

## 2017-08-29 DIAGNOSIS — Z3009 Encounter for other general counseling and advice on contraception: Secondary | ICD-10-CM

## 2017-08-29 DIAGNOSIS — O4400 Placenta previa specified as without hemorrhage, unspecified trimester: Secondary | ICD-10-CM

## 2017-08-29 DIAGNOSIS — O4430 Partial placenta previa with hemorrhage, unspecified trimester: Secondary | ICD-10-CM

## 2017-08-29 DIAGNOSIS — Z98891 History of uterine scar from previous surgery: Secondary | ICD-10-CM

## 2017-08-29 DIAGNOSIS — O4690 Antepartum hemorrhage, unspecified, unspecified trimester: Secondary | ICD-10-CM | POA: Diagnosis present

## 2017-08-29 LAB — CBC
HEMATOCRIT: 34.8 % — AB (ref 36.0–46.0)
Hemoglobin: 11.7 g/dL — ABNORMAL LOW (ref 12.0–15.0)
MCH: 28.8 pg (ref 26.0–34.0)
MCHC: 33.6 g/dL (ref 30.0–36.0)
MCV: 85.7 fL (ref 78.0–100.0)
PLATELETS: 266 10*3/uL (ref 150–400)
RBC: 4.06 MIL/uL (ref 3.87–5.11)
RDW: 17 % — AB (ref 11.5–15.5)
WBC: 4.8 10*3/uL (ref 4.0–10.5)

## 2017-08-29 LAB — TYPE AND SCREEN
ABO/RH(D): O POS
Antibody Screen: NEGATIVE

## 2017-08-29 MED ORDER — FERROUS GLUCONATE 324 (38 FE) MG PO TABS
324.0000 mg | ORAL_TABLET | Freq: Every day | ORAL | Status: DC
  Administered 2017-08-29 – 2017-09-02 (×4): 324 mg via ORAL
  Filled 2017-08-29 (×6): qty 1

## 2017-08-29 MED ORDER — CALCIUM CARBONATE ANTACID 500 MG PO CHEW
2.0000 | CHEWABLE_TABLET | ORAL | Status: DC | PRN
  Administered 2017-08-30 – 2017-08-31 (×2): 400 mg via ORAL
  Filled 2017-08-29 (×2): qty 2

## 2017-08-29 MED ORDER — ACETAMINOPHEN 325 MG PO TABS
650.0000 mg | ORAL_TABLET | ORAL | Status: DC | PRN
  Filled 2017-08-29 (×2): qty 2

## 2017-08-29 MED ORDER — SODIUM CHLORIDE 0.9% FLUSH
3.0000 mL | Freq: Two times a day (BID) | INTRAVENOUS | Status: DC
  Administered 2017-08-29 – 2017-09-02 (×7): 3 mL via INTRAVENOUS

## 2017-08-29 MED ORDER — SODIUM CHLORIDE 0.9 % IV SOLN: 250.0000 mL | INTRAVENOUS | Status: DC | PRN

## 2017-08-29 MED ORDER — DOCUSATE SODIUM 100 MG PO CAPS
100.0000 mg | ORAL_CAPSULE | Freq: Every day | ORAL | Status: DC
  Administered 2017-08-29 – 2017-09-03 (×4): 100 mg via ORAL
  Filled 2017-08-29 (×8): qty 1

## 2017-08-29 MED ORDER — ZOLPIDEM TARTRATE 5 MG PO TABS
5.0000 mg | ORAL_TABLET | Freq: Every evening | ORAL | Status: DC | PRN
  Administered 2017-08-29 – 2017-08-31 (×3): 5 mg via ORAL
  Filled 2017-08-29 (×3): qty 1

## 2017-08-29 MED ORDER — SODIUM CHLORIDE 0.9% FLUSH
3.0000 mL | INTRAVENOUS | Status: DC | PRN
Start: 2017-08-29 — End: 2017-09-02
  Administered 2017-09-01: 3 mL via INTRAVENOUS
  Filled 2017-08-29: qty 3

## 2017-08-29 MED ORDER — PRENATAL MULTIVITAMIN CH
1.0000 | ORAL_TABLET | Freq: Every day | ORAL | Status: DC
  Administered 2017-08-29 – 2017-08-31 (×3): 1 via ORAL
  Filled 2017-08-29 (×6): qty 1

## 2017-08-29 MED ORDER — DOCUSATE SODIUM 100 MG PO CAPS
100.0000 mg | ORAL_CAPSULE | Freq: Two times a day (BID) | ORAL | Status: DC | PRN
  Administered 2017-09-04: 100 mg via ORAL
  Filled 2017-08-29: qty 1

## 2017-08-29 MED ORDER — LACTATED RINGERS IV BOLUS (SEPSIS)
1000.0000 mL | Freq: Once | INTRAVENOUS | Status: AC
  Administered 2017-08-29: 1000 mL via INTRAVENOUS

## 2017-08-29 NOTE — H&P (Signed)
Margaret Knight is a 31 y.o. female [id number] @[redacted]w[redacted]d  presenting for heavy vaginal bleeding, second bleed with placenta previa.  She initially was diagnosed with vasa previa and admitted on 07/24/17. She received BMZ on 9/25 and 9/26 and on 10/10 and 10/11.  On 10/31 in the early am she woke up to heavy bleeding soaking through her clothes. She came to [facility] crying and upset that she may have lost the baby.     Clinic  none. 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) Prenatal Labs  Dating  [facility] 12/24, 8wk u/s Blood type: --/--/O POS (10/07 0511)   Genetic Screen 1 Screen: neg   AFP:     Quad:     NIPS: Antibody:NEG (10/07 0511)  Anatomic [place]  neg Rubella: imm  GTT Early:    n/a           Third trimester:  RPR: VDRL Nonreactive (06/19 0000)   Flu vaccine  HBsAg: Negative (06/19 0000)   TDaP vaccine         10/8                                      Rhogam: n/a HIV: Non-reactive (06/19 0000)   Baby Food                                               GBS: (For PCN allergy, check sensitivities)  Contraception  Pap:neg 2018  Circumcision    Pediatrician  CF:  Support Person  SMA  Prenatal Classes  Hgb electrophoresis:     OB History    Gravida Para Term Preterm AB Living   13 5 5  0 7 5   SAB TAB Ectopic Multiple Live Births   2 5 0 1 5     Past Medical History:  Diagnosis Date  . Chlamydia   . Chlamydia infection affecting pregnancy 10/09/2016   Rx sent for Zithromax Pt informed  . FH: breast cancer in first degree relative 07/07/2013  . History of recurrent UTI (urinary tract infection)   . Migraines   . Normal pregnancy, repeat 03/09/2012  . SVD (spontaneous vaginal delivery) 03/10/2012   Past Surgical History:  Procedure Laterality Date  . DILATION AND CURETTAGE OF UTERUS     Family History: family history includes Cancer in her mother; Diabetes in her maternal grandmother and paternal grandmother; Hypertension in her mother. Social History:  reports that she has never smoked. She has  never used smokeless tobacco. She reports that she does not drink alcohol or use drugs.     Maternal Diabetes: No Genetic Screening: Normal Maternal Ultrasounds/Referrals: Abnormal:  Findings:   Other: Fetal Ultrasounds or other Referrals:  None Maternal Substance Abuse:  No Significant Maternal Medications:  None Significant Maternal Lab Results:  Lab values include: Other:  Other Comments:  Placenta previa, GBS unknown  ROS History   Blood pressure 101/68, pulse 86, temperature 99.1 F (37.3 C), temperature source Oral, resp. rate 16, last menstrual period 09/08/2016, unknown if currently breastfeeding. Exam Physical Exam  Prenatal labs: ABO, Rh: --/--/O POS (10/31 5409) Antibody: NEG (10/31 0506) Rubella: Immune, Immune (06/19 0000) RPR: Non Reactive (10/09 0655)  HBsAg: Negative, Negative (06/19 0000)  HIV: Non-reactive, Non-reactive (06/19 0000)  GBS:  Assessment/Plan: 1. Placenta previa in third trimester   2. Unwanted fertility   3. Vaginal bleeding in pregnancy   4. Vaginal bleeding in pregnancy, third trimester   5. Placenta previa antepartum in third trimester   6. Hemorrhage due to partial placenta previa     Admit to [facility] [place] lock Continuous EFM/toco   Sharen CounterLisa Leftwich-Kirby 08/29/2017, 7:03 AM

## 2017-08-29 NOTE — MAU Note (Signed)
Patient presents to MAU with c/o heavy vaginal bleeding. Patient states she has vasa previa and was just discharged from the hospital one week ago. Patient started bleeding 30 minutes ago and states she has been having "tightness all day" and believes to be having contractions as well.

## 2017-08-30 ENCOUNTER — Inpatient Hospital Stay (HOSPITAL_COMMUNITY): Payer: 59

## 2017-08-30 DIAGNOSIS — Z3A32 32 weeks gestation of pregnancy: Secondary | ICD-10-CM

## 2017-08-30 DIAGNOSIS — O4693 Antepartum hemorrhage, unspecified, third trimester: Secondary | ICD-10-CM

## 2017-08-30 NOTE — Progress Notes (Signed)
Patient states she is not feeling UC.
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
## 2017-09-01 ENCOUNTER — Inpatient Hospital Stay (HOSPITAL_COMMUNITY): Payer: 59 | Admitting: Anesthesiology

## 2017-09-01 ENCOUNTER — Encounter (HOSPITAL_COMMUNITY)
Admission: AD | Disposition: A | Discharge status: Home / Self Care | Payer: Self-pay | Source: Ambulatory Visit | Attending: Obstetrics & Gynecology

## 2017-09-01 ENCOUNTER — Encounter (HOSPITAL_COMMUNITY): Payer: Self-pay | Admitting: Anesthesiology

## 2017-09-01 DIAGNOSIS — O4433 Partial placenta previa with hemorrhage, third trimester: Secondary | ICD-10-CM

## 2017-09-01 DIAGNOSIS — Z3A32 32 weeks gestation of pregnancy: Secondary | ICD-10-CM

## 2017-09-01 DIAGNOSIS — O321XX0 Maternal care for breech presentation, not applicable or unspecified: Secondary | ICD-10-CM

## 2017-09-01 DIAGNOSIS — O4403 Placenta previa specified as without hemorrhage, third trimester: Secondary | ICD-10-CM

## 2017-09-01 HISTORY — DX: Partial placenta previa with hemorrhage, third trimester: O44.33

## 2017-09-01 SURGERY — Surgical Case
Anesthesia: Spinal

## 2017-09-01 SURGERY — Surgical Case
Anesthesia: Regional

## 2017-09-01 MED ORDER — KETOROLAC TROMETHAMINE 30 MG/ML IJ SOLN
30.0000 mg | Freq: Four times a day (QID) | INTRAMUSCULAR | Status: AC
  Administered 2017-09-01 – 2017-09-02 (×3): 30 mg via INTRAVENOUS
  Filled 2017-09-01 (×3): qty 1

## 2017-09-01 MED ORDER — LACTATED RINGERS IV SOLN: INTRAVENOUS | Status: DC

## 2017-09-01 MED ORDER — ACETAMINOPHEN 325 MG PO TABS
650.0000 mg | ORAL_TABLET | ORAL | Status: DC | PRN
  Administered 2017-09-01 – 2017-09-03 (×3): 650 mg via ORAL
  Filled 2017-09-01: qty 2

## 2017-09-01 MED ORDER — SENNOSIDES-DOCUSATE SODIUM 8.6-50 MG PO TABS
2.0000 | ORAL_TABLET | ORAL | Status: DC
  Administered 2017-09-02 – 2017-09-03 (×2): 2 via ORAL
  Filled 2017-09-01 (×5): qty 2

## 2017-09-01 MED ORDER — FENTANYL CITRATE (PF) 100 MCG/2ML IJ SOLN
INTRAMUSCULAR | Status: DC | PRN
  Administered 2017-09-01: 25 ug via INTRATHECAL

## 2017-09-01 MED ORDER — KETOROLAC TROMETHAMINE 30 MG/ML IJ SOLN
INTRAMUSCULAR | Status: DC | PRN
  Administered 2017-09-01: 30 mg via INTRAVENOUS

## 2017-09-01 MED ORDER — OXYCODONE HCL 5 MG PO TABS
10.0000 mg | ORAL_TABLET | ORAL | Status: DC | PRN
  Administered 2017-09-02 – 2017-09-04 (×7): 10 mg via ORAL
  Filled 2017-09-01 (×7): qty 2

## 2017-09-01 MED ORDER — MORPHINE SULFATE (PF) 0.5 MG/ML IJ SOLN
INTRAMUSCULAR | Status: AC
  Filled 2017-09-01: qty 10

## 2017-09-01 MED ORDER — PHENYLEPHRINE HCL 10 MG/ML IJ SOLN
INTRAMUSCULAR | Status: DC | PRN
  Administered 2017-09-01: 80 ug via INTRAVENOUS
  Administered 2017-09-01: 40 ug via INTRAVENOUS

## 2017-09-01 MED ORDER — ZOLPIDEM TARTRATE 5 MG PO TABS: 5.0000 mg | ORAL_TABLET | Freq: Every evening | ORAL | Status: DC | PRN

## 2017-09-01 MED ORDER — FENTANYL CITRATE (PF) 100 MCG/2ML IJ SOLN: 25.0000 ug | INTRAMUSCULAR | Status: DC | PRN

## 2017-09-01 MED ORDER — MENTHOL 3 MG MT LOZG: 1.0000 | LOZENGE | OROMUCOSAL | Status: DC | PRN

## 2017-09-01 MED ORDER — PHENYLEPHRINE 8 MG IN D5W 100 ML (0.08MG/ML) PREMIX OPTIME
INJECTION | INTRAVENOUS | Status: DC | PRN
  Administered 2017-09-01: 60 ug/min via INTRAVENOUS

## 2017-09-01 MED ORDER — LIDOCAINE HCL (PF) 1 % IJ SOLN
INTRAMUSCULAR | Status: AC
  Filled 2017-09-01: qty 5

## 2017-09-01 MED ORDER — IBUPROFEN 600 MG PO TABS
600.0000 mg | ORAL_TABLET | Freq: Four times a day (QID) | ORAL | Status: DC
  Administered 2017-09-02 – 2017-09-04 (×4): 600 mg via ORAL
  Filled 2017-09-01 (×10): qty 1

## 2017-09-01 MED ORDER — KETOROLAC TROMETHAMINE 30 MG/ML IJ SOLN
INTRAMUSCULAR | Status: AC
  Filled 2017-09-01: qty 1

## 2017-09-01 MED ORDER — DIBUCAINE 1 % RE OINT: 1.0000 "application " | TOPICAL_OINTMENT | RECTAL | Status: DC | PRN

## 2017-09-01 MED ORDER — SOD CITRATE-CITRIC ACID 500-334 MG/5ML PO SOLN
ORAL | Status: AC
  Administered 2017-09-01: 30 mL
  Filled 2017-09-01: qty 15

## 2017-09-01 MED ORDER — TETANUS-DIPHTH-ACELL PERTUSSIS 5-2.5-18.5 LF-MCG/0.5 IM SUSP
0.5000 mL | Freq: Once | INTRAMUSCULAR | Status: DC
Start: 2017-09-02 — End: 2017-09-04

## 2017-09-01 MED ORDER — LACTATED RINGERS IV SOLN
INTRAVENOUS | Status: DC | PRN
  Administered 2017-09-01: 12:00:00 via INTRAVENOUS

## 2017-09-01 MED ORDER — PRENATAL MULTIVITAMIN CH
1.0000 | ORAL_TABLET | Freq: Every day | ORAL | Status: DC
  Administered 2017-09-02 – 2017-09-03 (×2): 1 via ORAL

## 2017-09-01 MED ORDER — OXYTOCIN 40 UNITS IN LACTATED RINGERS INFUSION - SIMPLE MED: 2.5000 [IU]/h | INTRAVENOUS | Status: DC

## 2017-09-01 MED ORDER — SCOPOLAMINE 1 MG/3DAYS TD PT72
MEDICATED_PATCH | TRANSDERMAL | Status: AC
  Filled 2017-09-01: qty 1

## 2017-09-01 MED ORDER — CEFAZOLIN SODIUM-DEXTROSE 2-4 GM/100ML-% IV SOLN
2.0000 g | INTRAVENOUS | Status: AC
  Administered 2017-09-01: 2 g via INTRAVENOUS
  Filled 2017-09-01: qty 100

## 2017-09-01 MED ORDER — SODIUM CHLORIDE 0.9 % IR SOLN
Status: DC | PRN
  Administered 2017-09-01: 900 mL

## 2017-09-01 MED ORDER — LACTATED RINGERS IV SOLN
INTRAVENOUS | Status: DC | PRN
  Administered 2017-09-01 (×2): via INTRAVENOUS

## 2017-09-01 MED ORDER — COCONUT OIL OIL
1.0000 "application " | TOPICAL_OIL | Status: DC | PRN
  Administered 2017-09-01: 1 via TOPICAL
  Filled 2017-09-01: qty 120

## 2017-09-01 MED ORDER — ONDANSETRON HCL 4 MG/2ML IJ SOLN
INTRAMUSCULAR | Status: AC
  Filled 2017-09-01: qty 2

## 2017-09-01 MED ORDER — ONDANSETRON HCL 4 MG/2ML IJ SOLN
INTRAMUSCULAR | Status: DC | PRN
  Administered 2017-09-01: 4 mg via INTRAVENOUS

## 2017-09-01 MED ORDER — DEXAMETHASONE SODIUM PHOSPHATE 10 MG/ML IJ SOLN
INTRAMUSCULAR | Status: DC | PRN
  Administered 2017-09-01: 5 mg via INTRAVENOUS

## 2017-09-01 MED ORDER — OXYTOCIN 10 UNIT/ML IJ SOLN
INTRAMUSCULAR | Status: DC | PRN
  Administered 2017-09-01: 40 [IU] via INTRAVENOUS

## 2017-09-01 MED ORDER — FENTANYL CITRATE (PF) 100 MCG/2ML IJ SOLN
INTRAMUSCULAR | Status: AC
  Filled 2017-09-01: qty 2

## 2017-09-01 MED ORDER — SCOPOLAMINE 1 MG/3DAYS TD PT72
MEDICATED_PATCH | TRANSDERMAL | Status: DC | PRN
  Administered 2017-09-01: 1 via TRANSDERMAL

## 2017-09-01 MED ORDER — WITCH HAZEL-GLYCERIN EX PADS: 1.0000 "application " | MEDICATED_PAD | CUTANEOUS | Status: DC | PRN

## 2017-09-01 MED ORDER — DEXAMETHASONE SODIUM PHOSPHATE 10 MG/ML IJ SOLN
INTRAMUSCULAR | Status: AC
  Filled 2017-09-01: qty 1

## 2017-09-01 MED ORDER — BUPIVACAINE IN DEXTROSE 0.75-8.25 % IT SOLN
INTRATHECAL | Status: AC
  Filled 2017-09-01: qty 2

## 2017-09-01 MED ORDER — OXYTOCIN 10 UNIT/ML IJ SOLN
INTRAMUSCULAR | Status: AC
  Filled 2017-09-01: qty 4

## 2017-09-01 MED ORDER — LACTATED RINGERS IV SOLN
INTRAVENOUS | Status: DC
  Administered 2017-09-01 (×2): via INTRAVENOUS

## 2017-09-01 MED ORDER — SIMETHICONE 80 MG PO CHEW
80.0000 mg | CHEWABLE_TABLET | Freq: Three times a day (TID) | ORAL | Status: DC
  Administered 2017-09-02 – 2017-09-03 (×4): 80 mg via ORAL
  Filled 2017-09-01 (×6): qty 1

## 2017-09-01 MED ORDER — OXYCODONE HCL 5 MG PO TABS
5.0000 mg | ORAL_TABLET | ORAL | Status: DC | PRN
  Administered 2017-09-01: 5 mg via ORAL
  Filled 2017-09-01: qty 1

## 2017-09-01 MED ORDER — DIPHENHYDRAMINE HCL 25 MG PO CAPS: 25.0000 mg | ORAL_CAPSULE | Freq: Four times a day (QID) | ORAL | Status: DC | PRN

## 2017-09-01 MED ORDER — SIMETHICONE 80 MG PO CHEW
80.0000 mg | CHEWABLE_TABLET | ORAL | Status: DC
  Administered 2017-09-02 – 2017-09-04 (×2): 80 mg via ORAL
  Filled 2017-09-01 (×3): qty 1

## 2017-09-01 MED ORDER — SIMETHICONE 80 MG PO CHEW
80.0000 mg | CHEWABLE_TABLET | ORAL | Status: DC | PRN
  Administered 2017-09-04: 80 mg via ORAL

## 2017-09-01 MED ORDER — MORPHINE SULFATE (PF) 0.5 MG/ML IJ SOLN
INTRAMUSCULAR | Status: DC | PRN
  Administered 2017-09-01: .2 mg via INTRATHECAL

## 2017-09-01 SURGICAL SUPPLY — 43 items
BENZOIN TINCTURE PRP APPL 2/3 (GAUZE/BANDAGES/DRESSINGS) ×3 IMPLANT
CHLORAPREP W/TINT 26ML (MISCELLANEOUS) ×3 IMPLANT
CLAMP CORD UMBIL (MISCELLANEOUS) IMPLANT
CLOSURE STERI-STRIP 1/2X4 (GAUZE/BANDAGES/DRESSINGS) ×1
CLOSURE WOUND 1/2 X4 (GAUZE/BANDAGES/DRESSINGS) ×1
CLOTH BEACON ORANGE TIMEOUT ST (SAFETY) ×3 IMPLANT
CLSR STERI-STRIP ANTIMIC 1/2X4 (GAUZE/BANDAGES/DRESSINGS) ×2 IMPLANT
DERMABOND ADVANCED (GAUZE/BANDAGES/DRESSINGS) ×2
DERMABOND ADVANCED .7 DNX12 (GAUZE/BANDAGES/DRESSINGS) ×1 IMPLANT
DRAPE C SECTION CLR SCREEN (DRAPES) IMPLANT
DRSG OPSITE POSTOP 4X10 (GAUZE/BANDAGES/DRESSINGS) ×3 IMPLANT
ELECT REM PT RETURN 9FT ADLT (ELECTROSURGICAL) ×3
ELECTRODE REM PT RTRN 9FT ADLT (ELECTROSURGICAL) ×1 IMPLANT
EXTRACTOR VACUUM M CUP 4 TUBE (SUCTIONS) IMPLANT
EXTRACTOR VACUUM M CUP 4' TUBE (SUCTIONS)
GLOVE BIO SURGEON STRL SZ7.5 (GLOVE) ×3 IMPLANT
GLOVE BIOGEL PI IND STRL 7.0 (GLOVE) ×1 IMPLANT
GLOVE BIOGEL PI INDICATOR 7.0 (GLOVE) ×2
GOWN STRL REUS W/TWL 2XL LVL3 (GOWN DISPOSABLE) ×3 IMPLANT
GOWN STRL REUS W/TWL LRG LVL3 (GOWN DISPOSABLE) ×6 IMPLANT
KIT ABG SYR 3ML LUER SLIP (SYRINGE) ×3 IMPLANT
NEEDLE HYPO 22GX1.5 SAFETY (NEEDLE) ×3 IMPLANT
NEEDLE HYPO 25X5/8 SAFETYGLIDE (NEEDLE) ×3 IMPLANT
NS IRRIG 1000ML POUR BTL (IV SOLUTION) ×3 IMPLANT
PACK C SECTION WH (CUSTOM PROCEDURE TRAY) ×3 IMPLANT
PAD OB MATERNITY 4.3X12.25 (PERSONAL CARE ITEMS) ×3 IMPLANT
PENCIL SMOKE EVAC W/HOLSTER (ELECTROSURGICAL) ×3 IMPLANT
RTRCTR C-SECT PINK 25CM LRG (MISCELLANEOUS) ×3 IMPLANT
STRIP CLOSURE SKIN 1/2X4 (GAUZE/BANDAGES/DRESSINGS) ×2 IMPLANT
SUT CHROMIC 1 CTX 36 (SUTURE) ×6 IMPLANT
SUT VIC AB 1 CT1 27 (SUTURE) ×4
SUT VIC AB 1 CT1 27XBRD ANTBC (SUTURE) ×2 IMPLANT
SUT VIC AB 2-0 CT1 (SUTURE) ×3 IMPLANT
SUT VIC AB 2-0 CT1 27 (SUTURE) ×4
SUT VIC AB 2-0 CT1 TAPERPNT 27 (SUTURE) ×2 IMPLANT
SUT VIC AB 3-0 CT1 27 (SUTURE) ×4
SUT VIC AB 3-0 CT1 TAPERPNT 27 (SUTURE) ×2 IMPLANT
SUT VIC AB 3-0 SH 27 (SUTURE)
SUT VIC AB 3-0 SH 27X BRD (SUTURE) IMPLANT
SUT VIC AB 4-0 KS 27 (SUTURE) ×3 IMPLANT
SYR BULB IRRIGATION 50ML (SYRINGE) IMPLANT
TOWEL OR 17X24 6PK STRL BLUE (TOWEL DISPOSABLE) ×3 IMPLANT
TRAY FOLEY BAG SILVER LF 14FR (SET/KITS/TRAYS/PACK) ×3 IMPLANT

## 2017-09-01 NOTE — Transfer of Care (Signed)
Immediate Anesthesia Transfer of Care Note  Patient: Margaret Knight  Procedure(s) Performed: CESAREAN SECTION (N/A )  Patient Location: PACU  Anesthesia Type:Spinal  Level of Consciousness: awake  Airway & Oxygen Therapy: Patient Spontanous Breathing  Post-op Assessment: Report given to RN and Post -op Vital signs reviewed and stable  Post vital signs: Reviewed  Last Vitals:  Vitals:   09/01/17 0343 09/01/17 0758  BP: (!) 81/50 99/61  Pulse: 85 85  Resp: 18 18  Temp: 37.1 C 37.1 C  SpO2: 100% 100%    Last Pain:  Vitals:   09/01/17 0758  TempSrc: Oral  PainSc:       Patients Stated Pain Goal: 2 (08/29/17 1223)  Complications: No apparent anesthesia complications

## 2017-09-01 NOTE — Progress Notes (Signed)
Pt called c/o vaginal bleeding.   Peri pad has dark red blood with pink around edge.  Will continue to monitor

## 2017-09-01 NOTE — Consult Note (Signed)
[facility]  Delivery Note:  C-section       09/01/2017  12:04 PM  I was called to the operating room at the request of the patient's obstetrician (Dr. Ervin) for a primary c-section.  PRENATAL HX:  This is a 31 y/o G13P5075 at 32 and 5/[redacted] weeks gestation. Her pregnancy has been complicated by placenta previa and she was admitted on 10/31 for the second time for bleeding.  She was admitted previously and received BMZ on 10/10 and 10/11.  She also has questionable vasa previa and was taken for c-section today when bleeding became heavier.  GBS unknown with ROM at delivery.  Breech delivery.    DELIVERY:  Infant was had poor tone initially so cord clamping was not delayed, but she responded quickly to standard warming, drying and stimulation.  A pulse oximeter was applied and O2 saturations were 100% by 5 minutes of age in RA.  APGARs 8 and 9.  Exam within normal limits.   _____________________ Electronically Signed By: Laketa Sandoz, MD Neonatologist  

## 2017-09-01 NOTE — Progress Notes (Signed)
Faculty Practice Attending Note  Called by RN for bleeding in the setting of previa, up to evaluate. Patient woke with gush of blood and called RN. She is now on third pad. Patient very upset and concerned for fetal well being. States she had some cramping tonight and right lower abdomen hurts a little bit but otherwise without issue.   BP 92/65 (BP Location: Left Arm)   Pulse 72   Temp 98 F (36.7 C) (Oral)   Resp 18   Ht 5\' 7"  (1.702 m)   Wt 162 lb (73.5 kg)   LMP 09/08/2016 (Exact Date)   SpO2 100%   BMI 25.37 kg/m   Gen: alert, oriented, mild distress, anxious Resp: normal effort of breathing ABd: soft, gravid, non-tender GU: pads examined, 2 with moderate amount dark blood with red tinges, third with minimal dark red spotting  FHT: 130s, moderate variability, accels present, no decels Toco: no ctx  A/P: 31 yo [id number] @ 3135w5d who is admitted for vaginal bleeding with a marginal previa (per last US of 11/1, formerly a vasa previa), velamentous cord insertion. This is the first bleed since 08/29/17. She is understandably extremely anxious about status of baby. Reviewed course with patient, she is concerned that we are missing a vasa previa. Reviewed US report with patient that there is no longer a vasa previa noted. Had long conversation regarding management and delivery plans, she is anxious because the plan has changed, I reviewed with her that if the placenta moves off the cervix, the plan will change as she may not need a c-section. She was reassured by this discussion. Reviewed that if she bleeds heavily, or if there were signs of fetal distress, will deliver tonight but with small amount of bleeding overall that has lightened and reassuring fetal status, no need to deliver right now. Reviewed that she may need general anesthesia in the event of emergency.   Patient also states she has decided against having the tubal ligation at this time. I reminded her that she may change her  mind at any time and if she decides she does want it done, she needs to inform the team. She verbalizes understanding of this.   Answered all questions, will cont to closely monitor and deliver for non-reassuring fetal status or heavy vaginal bleeding. She will call RN with further issues.   Baldemar LenisK. Meryl Davis, M.D. Attending Obstetrician & Gynecologist, [facility] for [facility], [facility]

## 2017-09-01 NOTE — Progress Notes (Addendum)
Dr. Earlene Plateravis notified pt having Uc last 30 mins 5-8  mins apart pt is asleep. No new orders continue to monitor

## 2017-09-01 NOTE — Anesthesia Preprocedure Evaluation (Signed)
Anesthesia Evaluation  Patient identified by MRN, date of birth, ID band Patient awake    Reviewed: Allergy & Precautions, H&P , Patient's Chart, lab work & pertinent test results, reviewed documented beta blocker date and time   Airway Mallampati: II  TM Distance: >3 FB Neck ROM: full    Dental no notable dental hx.    Pulmonary    Pulmonary exam normal breath sounds clear to auscultation       Cardiovascular  Rhythm:regular Rate:Normal     Neuro/Psych    GI/Hepatic   Endo/Other    Renal/GU      Musculoskeletal   Abdominal   Peds  Hematology   Anesthesia Other Findings   Reproductive/Obstetrics                             Anesthesia Physical Anesthesia Plan  ASA: II  Anesthesia Plan: Spinal   Post-op Pain Management:    Induction:   PONV Risk Score and Plan: 2 and Ondansetron, Dexamethasone and Treatment may vary due to age or medical condition  Airway Management Planned:   Additional Equipment:   Intra-op Plan:   Post-operative Plan:   Informed Consent: I have reviewed the patients History and Physical, chart, labs and discussed the procedure including the risks, benefits and alternatives for the proposed anesthesia with the patient or authorized representative who has indicated his/her understanding and acceptance.   Dental Advisory Given  Plan Discussed with: CRNA and Surgeon  Anesthesia Plan Comments: (  )        Anesthesia Quick Evaluation  

## 2017-09-01 NOTE — Progress Notes (Signed)
Pt pad has dark red with light red blood around edges, small amt noted on pad will continue to monitor

## 2017-09-01 NOTE — Progress Notes (Signed)
FACULTY PRACTICE ANTEPARTUM PROGRESS NOTE  Serita GritShimika S Springsteen is a 31 y.o. [id number] at 9365w5d who is admitted for bleeding in setting of marginal previa.  Estimated Date of Delivery: 10/22/17 Fetal presentation is unsure.  Length of Stay:  3 Days. Admitted 08/29/2017  Subjective:  Patient reports normal fetal movement.  She had contractions overnight but they are improved now. Bleeding is lighter than it was overnight and she reports no gushes..  Vitals:  Blood pressure (!) 81/50, pulse 85, temperature 98.7 F (37.1 C), temperature source Oral, resp. rate 18, height 5\' 7"  (1.702 m), weight 162 lb (73.5 kg), last menstrual period 09/08/2016, SpO2 100 %, unknown if currently breastfeeding. Physical Examination: CONSTITUTIONAL: Well-developed, well-nourished female in no acute distress.  HENT:  Normocephalic, atraumatic, External right and left ear normal. Oropharynx is clear and moist EYES: Conjunctivae and EOM are normal. Pupils are equal, round, and reactive to light. No scleral icterus.  NECK: Normal range of motion, supple, no masses. SKIN: Skin is warm and dry. No rash noted. Not diaphoretic. No erythema. No pallor. NEUROLGIC: Alert and oriented to person, place, and time. Normal reflexes, muscle tone coordination. No cranial nerve deficit noted. PSYCHIATRIC: Normal mood and affect. Normal behavior. Normal judgment and thought content. CARDIOVASCULAR: Normal heart rate noted, regular rhythm RESPIRATORY: Effort and breath sounds normal, no problems with respiration noted MUSCULOSKELETAL: Normal range of motion. No edema and no tenderness. ABDOMEN: Soft, nontender, nondistended, gravid.   Fetal monitoring: FHR: 135 bpm, Variability: moderate, Accelerations: Present, Decelerations: Absent  Uterine activity: now none contractions per hour  No results found for this or any previous visit (from the past 48 hour(s)).    Current scheduled medications . docusate sodium  100 mg Oral Daily   . ferrous gluconate  324 mg Oral Q breakfast  . prenatal multivitamin  1 tablet Oral Q1200  . sodium chloride flush  3 mL Intravenous Q12H    I have reviewed the patient's current medications.  ASSESSMENT: Principal Problem:   Marginal placenta previa with intrapartum hemorrhage in third trimester Active Problems:   Supervision of high risk elderly multigravida in third trimester   Vaginal bleeding in pregnancy, third trimester   PLAN: Cont to monitor Keep NPO through the morning, will reassess if bleeding stops Cont LR 125 mL/hr for now Reviewed with patient that if bleeding becomes heavier or evidence of fetal compromise, will deliver by c-section She is agreeable to blood transfusion if necessary She DOES NOT want to have BTL done   Continue routine antenatal care.   Baldemar LenisK. Meryl Zoey Gilkeson, M.D. Attending Obstetrician & Gynecologist, [facility] [employer] [employer], [facility]

## 2017-09-01 NOTE — Op Note (Signed)
Cesarean Section Operative Report  Margaret Knight  PROCEDURE DATE: 09/01/2017  PREOPERATIVE DIAGNOSES: Intrauterine pregnancy at 67w5dweeks gestation; Placenta previa; Vaginal bleeding  POSTOPERATIVE DIAGNOSES: The same  PROCEDURE: Primary Low Transverse Cesarean Section  SURGEON:   Surgeon(s) and Role:    * EChancy Milroy MD - Primary    * Helyne Genther, JJenne Pane MD - Fellow  ASSISTANT:  JCrissie Figures MD - OB Fellow   INDICATIONS: Margaret SABANis a 31y.o. [id number] 31w5dere for cesarean section secondary to the indications listed under preoperative diagnoses; please see preoperative note for further details.  The risks of cesarean section were discussed with the patient including but were not limited to: bleeding which may require transfusion or reoperation; infection which may require antibiotics; injury to bowel, bladder, ureters or other surrounding organs; injury to the fetus; need for additional procedures including hysterectomy in the event of a life-threatening hemorrhage; placental abnormalities wth subsequent pregnancies, incisional problems, thromboembolic phenomenon and other postoperative/anesthesia complications.   The patient concurred with the proposed plan, giving informed written consent for the procedure.    FINDINGS:  Viable female infant in breech presentation.  Apgars 8 and 9.  Clear amniotic fluid.  Intact placenta, three vessel cord.  Normal uterus, fallopian tubes and ovaries bilaterally.  ANESTHESIA: Spinal INTRAVENOUS FLUIDS: 2800 ml ESTIMATED BLOOD LOSS: 727 mL URINE OUTPUT: 500 SPECIMENS: Placenta sent to pathology COMPLICATIONS: None immediate  PROCEDURE IN DETAIL:  The patient preoperatively received intravenous antibiotics and had sequential compression devices applied to her lower extremities.  She was then taken to the operating room where spinal anesthesia was administered and was found to be adequate. She was then placed in a dorsal supine  position with a leftward tilt, and prepped and draped in a sterile manner.  A foley catheter was placed into her bladder and attached to constant gravity.    After an adequate timeout was performed, a Pfannenstiel skin incision was made with scalpel and carried through to the underlying layer of fascia. The fascia was incised in the midline, and this incision was extended bilaterally using the Mayo scissors.  Kocher clamps were applied to the superior aspect of the fascial incision and the underlying rectus muscles were dissected off bluntly.  A similar process was carried out on the inferior aspect of the fascial incision. The rectus muscles were separated in the midline bluntly and the peritoneum was entered bluntly. A bladder flap was created using Met scissors. Attention was turned to the lower uterine segment where a low transverse hysterotomy was made with a scalpel and extended bilaterally bluntly.  The infant was successfully delivered breech, noted to have poor tone, therefore the cord was clamped and cut without delay, and the infant was handed over to the awaiting neonatology team. Uterine massage was then administered, and the placenta delivered intact with a three-vessel cord. The uterus was then cleared of clots and debris.  The hysterotomy was closed with Monocryl in a running locked fashion, and an imbricating layer was also placed with Monocryl.  Figure-of-eight serosal stitches were placed to help with hemostasis.  The pelvis was cleared of all clot and debris. Hemostasis was confirmed on all surfaces. The bladder flap was re-approximated with 2-0 Vycril. The peritoneum and rectus muscles were closed with a 2-0 Vicryl running stitch. The fascia was then closed using 0 Vicryl in a running fashion.  The subcutaneous layer was irrigated, then reapproximated with 2-0 plain gut interrupted stitches.  The skin  was closed with a 4-0 Vicryl subcuticular stitch. Steri-strips then applied.  The patient  tolerated the procedure well. Sponge, lap, instrument and needle counts were correct x 3.  She was taken to the recovery room in stable condition.    Disposition: PACU - hemodynamically stable.   Maternal Condition: stable    Signed: Gailen Shelter, MD OB Fellow 09/01/2017 1:17 PM
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
## 2017-09-01 NOTE — Anesthesia Procedure Notes (Signed)
Spinal  Patient location during procedure: OR Staffing Anesthesiologist: Cristela BlueJACKSON, Cori Henningsen Spinal Block Patient position: sitting Prep: DuraPrep Patient monitoring: heart rate, blood pressure and continuous pulse ox Approach: right paramedian Location: L3-4 Injection technique: single-shot Needle Needle type: Sprotte  Needle gauge: 24 G Needle length: 9 cm Assessment Sensory level: T4 Additional Notes Spinal Dosage in OR  .75% Bupivicaine ml       1.6     [id number]   mcg        100    Fentanyl mcg            25

## 2017-09-02 ENCOUNTER — Encounter (HOSPITAL_COMMUNITY): Payer: Self-pay | Admitting: Anesthesiology

## 2017-09-02 ENCOUNTER — Other Ambulatory Visit: Payer: Self-pay

## 2017-09-02 LAB — CBC
HEMATOCRIT: 26.3 % — AB (ref 36.0–46.0)
HEMOGLOBIN: 8.8 g/dL — AB (ref 12.0–15.0)
MCH: 28.5 pg (ref 26.0–34.0)
MCHC: 33.5 g/dL (ref 30.0–36.0)
MCV: 85.1 fL (ref 78.0–100.0)
Platelets: 233 10*3/uL (ref 150–400)
RBC: 3.09 MIL/uL — ABNORMAL LOW (ref 3.87–5.11)
RDW: 16.8 % — AB (ref 11.5–15.5)
WBC: 10.8 10*3/uL — ABNORMAL HIGH (ref 4.0–10.5)

## 2017-09-02 NOTE — Addendum Note (Signed)
Addendum  created 09/02/17 0748 by Angela AdamWrinkle, Akeia Perot G, CRNA   Sign clinical note

## 2017-09-02 NOTE — Lactation Note (Signed)
This note was copied from a baby's chart. Lactation Consultation Note  Patient Name: Margaret Knight ZOXWR'UToday's Date: 09/02/2017   Baby 24 hours old. Mom reports no questions regarding pumping at this time, but is about to fall asleep. Mom has assembled pump at bedside. Enc to call for assistance as needed.  Maternal Data    Feeding Feeding Type: (P) Donor Breast Milk Length of feed: (P) 30 min  LATCH Score                   Interventions    Lactation Tools Discussed/Used     Consult Status      Sherlyn HayJennifer D Anarosa Kubisiak 09/02/2017, 12:24 PM

## 2017-09-02 NOTE — Progress Notes (Signed)
Subjective: Postpartum Day 1: Cesarean Delivery Patient without complaints this morning. Pain controlled. Tolerating diet. Breast pumping. Up to restroom without problems  Objective: Vital signs in last 24 hours: Temp:  [97.6 F (36.4 C)-98.7 F (37.1 C)] 98.3 F (36.8 C) (11/04 0004) Pulse Rate:  [57-85] 57 (11/04 0004) Resp:  [17-19] 18 (11/04 0004) BP: (92-103)/(50-66) 92/50 (11/04 0004) SpO2:  [100 %] 100 % (11/04 0009)  Physical Exam:  General: alert Lochia: appropriate Uterine Fundus: firm Incision: healing well DVT Evaluation: No evidence of DVT seen on physical exam.  Recent Labs    09/02/17 0504  HGB 8.8*  HCT 26.3*    Assessment/Plan: Status post Cesarean section. Doing well postoperatively.  Continue current care.  Hermina StaggersMichael L Ervin 09/02/2017, 7:13 AM
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
## 2017-09-02 NOTE — Anesthesia Postprocedure Evaluation (Signed)
Anesthesia Post Note  Patient: Margaret Knight  Procedure(s) Performed: CESAREAN SECTION (N/A )     Patient location during evaluation: PACU Anesthesia Type: Spinal Level of consciousness: awake Pain management: satisfactory to patient Vital Signs Assessment: post-procedure vital signs reviewed and stable Respiratory status: spontaneous breathing Cardiovascular status: blood pressure returned to baseline Postop Assessment: no headache and spinal receding Anesthetic complications: no    Last Vitals:  Vitals:   09/01/17 1600 09/01/17 1652  BP: (!) 95/59 96/65  Pulse: 72 66  Resp: 18 18  Temp: 36.9 C   SpO2: 100%     Last Pain:  Vitals:   09/01/17 2208  TempSrc:   PainSc: 4    Pain Goal: Patients Stated Pain Goal: 2 (08/29/17 1223)               Jiles GarterJACKSON,[name]

## 2017-09-02 NOTE — Anesthesia Postprocedure Evaluation (Signed)
Anesthesia Post Note  Patient: Margaret Knight  Procedure(s) Performed: CESAREAN SECTION (N/A )     Patient location during evaluation: [facility] Anesthesia Type: Spinal Level of consciousness: awake and alert, oriented and patient cooperative Pain management: pain level controlled Vital Signs Assessment: post-procedure vital signs reviewed and stable Respiratory status: spontaneous breathing Cardiovascular status: stable Postop Assessment: no headache, patient able to bend at knees, no signs of nausea or vomiting and spinal receding Anesthetic complications: no Comments: Pain score 6 and is manageable to patient.      Last Vitals:  Vitals:   09/02/17 0004 09/02/17 0009  BP: (!) 92/50   Pulse: (!) 57   Resp: 18   Temp: 36.8 C   SpO2: 100% 100%    Last Pain:  Vitals:   09/02/17 0636  TempSrc:   PainSc: 5    Pain Goal: Patients Stated Pain Goal: 2 (08/29/17 1223)               Merrilyn PumaWRINKLE,Tressie Ragin

## 2017-09-02 NOTE — Plan of Care (Signed)
Problem: Education: Goal: Knowledge of disease or condition will improve Outcome: Progressing Patient informed of post operative plan of care. Questions encouraged and answers given. Goal: Knowledge of the prescribed therapeutic regimen will improve Outcome: Progressing Plan of care reviewed with patient.  Problem: Pain Management: Goal: Relief or control of pain will improve Outcome: Progressing Patient encouraged to ask for pain medication. Reviewed with patient how often medication is allowed.

## 2017-09-03 MED ORDER — FERROUS GLUCONATE 324 (38 FE) MG PO TABS
324.0000 mg | ORAL_TABLET | Freq: Two times a day (BID) | ORAL | Status: DC
  Filled 2017-09-03 (×2): qty 1

## 2017-09-03 NOTE — Progress Notes (Signed)
Subjective: Postpartum Day 2: Cesarean Delivery Patient is without complaints this morning. Pain controlled. Tolerating diet. Breast pumping. Ambulating without dizziness.  +Flatus.  Objective: Vital signs in last 24 hours: Temp:  [99 F (37.2 C)-99.1 F (37.3 C)] 99.1 F (37.3 C) (11/05 0800) Pulse Rate:  [75-82] 77 (11/05 0800) Resp:  [16] 16 (11/05 0800) BP: (90-114)/(53-76) 114/72 (11/05 0800) SpO2:  [98 %-100 %] 100 % (11/05 0800)  Physical Exam:  General: alert Lochia: appropriate Uterine Fundus: firm Incision: healing well DVT Evaluation: No evidence of DVT seen on physical exam.  Recent Labs    09/02/17 0504  HGB 8.8*  HCT 26.3*    Assessment/Plan: Status post Cesarean section. Doing well postoperatively.  Continue oral iron therapy for anemia Continue current care.  Ugonna Anyanwu 09/03/2017, 11:40 AM

## 2017-09-04 DIAGNOSIS — Z98891 History of uterine scar from previous surgery: Secondary | ICD-10-CM

## 2017-09-04 MED ORDER — SENNOSIDES-DOCUSATE SODIUM 8.6-50 MG PO TABS: 2.0000 | ORAL_TABLET | ORAL | 0 refills | Status: DC

## 2017-09-04 MED ORDER — OXYCODONE-ACETAMINOPHEN 5-325 MG PO TABS: 1.0000 | ORAL_TABLET | Freq: Four times a day (QID) | ORAL | 0 refills | Status: DC | PRN

## 2017-09-04 MED ORDER — FERROUS GLUCONATE 324 (38 FE) MG PO TABS: 324.0000 mg | ORAL_TABLET | Freq: Two times a day (BID) | ORAL | 3 refills | Status: DC

## 2017-09-04 NOTE — Discharge Instructions (Signed)
Mastitis Mastitis is redness, soreness, and puffiness (inflammation) in an area of the breast. It is often caused by an infection that occurs when bacteria enter the skin. The infection is often helped by antibiotic medicine. Follow these instructions at home:  Only take medicines as told by your doctor.  If your doctor prescribed an antibiotic medicine, take it as told. Finish it even if you start to feel better.  Do not wear a tight or underwire bra. Wear a soft support bra.  Drink more fluids, especially if you have a fever.  If you are breastfeeding: ? Keep emptying the breast. Your doctor can tell you if the milk is safe. Use a breast pump if you are told to stop nursing. ? Keep your nipples clean and dry. ? Empty the first breast before going to the other breast. Use a breast pump if your baby is not emptying your breast. ? If you go back to work, pump your breasts while at work. ? Avoid letting your breasts get overly filled with milk (engorged). Contact a doctor if:  You have pus-like fluid leaking from your breast.  Your symptoms do not get better within 2 days. Get help right away if:  Your pain and puffiness are getting worse.  Your pain is not helped by medicine.  You have a red line going from your breast toward your armpit.  You have a fever or lasting symptoms for more than 2-3 days.  You have a fever and your symptoms suddenly get worse. This information is not intended to replace advice given to you by your health care provider. Make sure you discuss any questions you have with your health care provider. Document Released: 10/04/2009 Document Revised: 03/23/2016 Document Reviewed: 05/16/2013 Elsevier Interactive Patient Education  2017 [employer] Cesarean Delivery, Care After Refer to this sheet in the next few weeks. These instructions provide you with information about caring for yourself after your procedure. Your health care provider may also give you  more specific instructions. Your treatment has been planned according to current medical practices, but problems sometimes occur. Call your health care provider if you have any problems or questions after your procedure. What can I expect after the procedure? After the procedure, it is common to have:  A small amount of blood or clear fluid coming from the incision.  Some redness, swelling, and pain in your incision area.  Some abdominal pain and soreness.  Vaginal bleeding (lochia).  Pelvic cramps.  Fatigue.  Follow these instructions at home: Incision care   Follow instructions from your health care provider about how to take care of your incision. Make sure you: ? Wash your hands with soap and water before you change your bandage (dressing). If soap and water are not available, use hand sanitizer. ? Change your dressing as told by your health care provider. ? Leave stitches (sutures), skin staples, skin glue, or adhesive strips in place. These skin closures may need to stay in place for 2 weeks or longer. If adhesive strip edges start to loosen and curl up, you may trim the loose edges. Do not remove adhesive strips completely unless your health care provider tells you to do that.  Check your incision area every day for signs of infection. Check for: ? More redness, swelling, or pain. ? More fluid or blood. ? Warmth. ? Pus or a bad smell.  When you cough or sneeze, hug a pillow. This helps with pain and decreases the chance of your  incision opening up (dehiscing). Do this until your incision heals. Medicines  Take over-the-counter and prescription medicines only as told by your health care provider.  If you were prescribed an antibiotic medicine, take it as told by your health care provider. Do not stop taking the antibiotic until it is finished. Driving  Do not drive or operate heavy machinery while taking prescription pain medicine.  Do not drive for 24 hours if you  received a sedative. Lifestyle  Do not drink alcohol. This is especially important if you are breastfeeding or taking pain medicine.  Do not use tobacco products, including cigarettes, chewing tobacco, or e-cigarettes. If you need help quitting, ask your health care provider. Tobacco can delay wound healing. Eating and drinking  Drink at least 8 eight-ounce glasses of water every day unless told not to by your health care provider. If you breastfeed, you may need to drink more water than this.  Eat high-fiber foods every day. These foods may help prevent or relieve constipation. High-fiber foods include: ? Whole grain cereals and breads. ? Brown rice. ? Beans. ? Fresh fruits and vegetables. Activity  Return to your normal activities as told by your health care provider. Ask your health care provider what activities are safe for you.  Rest as much as possible. Try to rest or take a nap while your baby is sleeping.  Do not lift anything that is heavier than your baby or 10 lb (4.5 kg) as told by your health care provider.  Ask your health care provider when you can engage in sexual activity. This may depend on your: ? Risk of infection. ? Healing rate. ? Comfort and desire to engage in sexual activity. Bathing  Do not take baths, swim, or use a hot tub until your health care provider approves. Ask your health care provider if you can take showers. You may only be allowed to take sponge baths until your incision heals.  Keep your dressing dry as told by your health care provider. General instructions  Do not use tampons or douches until your health care provider approves.  Wear: ? Loose, comfortable clothing. ? A supportive and well-fitting bra.  Watch for any blood clots that may pass from your vagina. These may look like clumps of dark red, brown, or black discharge.  Keep your perineum clean and dry as told by your health care provider.  Wipe from front to back when you  use the toilet.  If possible, have someone help you care for your baby and help with household activities for a few days after you leave the hospital.  Keep all follow-up visits for you and your baby as told by your health care provider. This is important. Contact a health care provider if:  You have: ? Bad-smelling vaginal discharge. ? Difficulty urinating. ? Pain when urinating. ? A sudden increase or decrease in the frequency of your bowel movements. ? More redness, swelling, or pain around your incision. ? More fluid or blood coming from your incision. ? Pus or a bad smell coming from your incision. ? A fever. ? A rash. ? Little or no interest in activities you used to enjoy. ? Questions about caring for yourself or your baby. ? Nausea.  Your incision feels warm to the touch.  Your breasts turn red or become painful or hard.  You feel unusually sad or worried.  You vomit.  You pass large blood clots from your vagina. If you pass a blood clot, save  it to show to your health care provider. Do not flush blood clots down the toilet without showing your health care provider.  You urinate more than usual.  You are dizzy or light-headed.  You have not breastfed and have not had a menstrual period for 12 weeks after delivery.  You stopped breastfeeding and have not had a menstrual period for 12 weeks after stopping breastfeeding. Get help right away if:  You have: ? Pain that does not go away or get better with medicine. ? Chest pain. ? Difficulty breathing. ? Blurred vision or spots in your vision. ? Thoughts about hurting yourself or your baby. ? New pain in your abdomen or in one of your legs. ? A severe headache.  You faint.  You bleed from your vagina so much that you fill two sanitary pads in one hour. This information is not intended to replace advice given to you by your health care provider. Make sure you discuss any questions you have with your health care  provider. Document Released: 07/08/2002 Document Revised: 02/24/2016 Document Reviewed: 09/20/2015 Elsevier Interactive Patient Education  2017 [employer].
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
## 2017-09-05 ENCOUNTER — Encounter: Payer: 59 | Admitting: Family Medicine

## 2017-09-06 ENCOUNTER — Ambulatory Visit: Payer: Self-pay

## 2017-09-06 NOTE — Lactation Note (Signed)
This note was copied from a baby's chart. Lactation Consultation Note  Patient Name: Margaret Knight WGNFA'OToday's Date: 09/06/2017 Reason for consult: Follow-up assessment;NICU baby;Preterm <34wks Baby is 205 days old and 33.3 CGA.  Mom wanting to put baby to breast.  She states she tries to pump 8-12 times in 24 hours but not always able to.  She states she is pumping small amounts for a total of approximately 60 mls per day.  Discussed importance of fluids and adequate calories.  Instructed to post pump after attempts at breast.  Mom states she plans on pumping with Piper's feeding schedule.  Placed baby skin to skin on left breast.  Milk easily hand expressed.  Baby opened mouth and latched easily to breast.  Active suck/swallows for 5 minutes before falling asleep.  Discussed normal preterm feeding expectations.  Maternal Data    Feeding Feeding Type: Breast Fed Length of feed: 5 min  LATCH Score Latch: Grasps breast easily, tongue down, lips flanged, rhythmical sucking.  Audible Swallowing: A few with stimulation  Type of Nipple: Everted at rest and after stimulation  Comfort (Breast/Nipple): Soft / non-tender  Hold (Positioning): Assistance needed to correctly position infant at breast and maintain latch.  LATCH Score: 8  Interventions Interventions: Breast feeding basics reviewed;Assisted with latch;Skin to skin;Adjust position;Support pillows;Breast massage;Hand express  Lactation Tools Discussed/Used     Consult Status Consult Status: PRN    Huston FoleyMOULDEN, Nikiyah Fackler S 09/06/2017, 12:44 PM

## 2017-09-07 ENCOUNTER — Encounter (HOSPITAL_COMMUNITY)
Admission: RE | Admit: 2017-09-07 | Discharge: 2017-09-07 | Disposition: A | Discharge status: Home / Self Care | Payer: 59 | Source: Ambulatory Visit

## 2017-09-07 NOTE — Patient Instructions (Signed)
Margaret Knight  09/07/2017   Your procedure is scheduled on:  09/10/2017  [facility] through [facility] at 1:00 PM.  Pick up the phone at the desk and dial [phone]  Call this number if you have problems the morning of [phone]  Remember:   Do not eat food:After Midnight.  Do not drink clear liquids: After Midnight.  Take these medicines the morning of surgery with A SIP OF WATER: none   Do not wear jewelry, make-up or nail polish.  Do not wear lotions, powders, or perfumes. Do not wear deodorant.  Do not shave 48 hours prior to surgery.  Do not bring valuables to the hospital.  [facility] is not   responsible for any belongings or valuables brought to the hospital.  Contacts, dentures or bridgework may not be worn into surgery.  Leave suitcase in the car. After surgery it may be brought to your room.  For patients admitted to the hospital, checkout time is 11:00 AM the day of              discharge.    N/A   Please read over the following fact sheets that you were given:   Surgical Site Infection Prevention

## 2017-09-10 ENCOUNTER — Inpatient Hospital Stay (HOSPITAL_COMMUNITY): Admission: RE | Admit: 2017-09-10 | Payer: 59 | Source: Ambulatory Visit | Admitting: Family Medicine

## 2017-09-10 SURGERY — Surgical Case
Anesthesia: Regional | Laterality: Bilateral

## 2017-10-08 ENCOUNTER — Ambulatory Visit: Payer: 59 | Admitting: Medical

## 2017-11-01 ENCOUNTER — Ambulatory Visit: Payer: 59 | Admitting: Student

## 2018-04-19 ENCOUNTER — Encounter (HOSPITAL_COMMUNITY): Payer: Self-pay | Admitting: *Deleted

## 2018-04-19 ENCOUNTER — Inpatient Hospital Stay (HOSPITAL_COMMUNITY)
Admission: AD | Admit: 2018-04-19 | Discharge: 2018-04-19 | Disposition: A | Discharge status: Home / Self Care | Payer: 59 | Source: Ambulatory Visit | Attending: Obstetrics and Gynecology | Admitting: Obstetrics and Gynecology

## 2018-04-19 DIAGNOSIS — N939 Abnormal uterine and vaginal bleeding, unspecified: Secondary | ICD-10-CM | POA: Diagnosis present

## 2018-04-19 DIAGNOSIS — B373 Candidiasis of vulva and vagina: Secondary | ICD-10-CM | POA: Diagnosis not present

## 2018-04-19 DIAGNOSIS — B3731 Acute candidiasis of vulva and vagina: Secondary | ICD-10-CM

## 2018-04-19 LAB — WET PREP, GENITAL
Sperm: NONE SEEN
TRICH WET PREP: NONE SEEN
YEAST WET PREP: NONE SEEN

## 2018-04-19 LAB — CBC
HEMATOCRIT: 34.2 % — AB (ref 36.0–46.0)
HEMOGLOBIN: 10.5 g/dL — AB (ref 12.0–15.0)
MCH: 24.7 pg — AB (ref 26.0–34.0)
MCHC: 30.7 g/dL (ref 30.0–36.0)
MCV: 80.5 fL (ref 78.0–100.0)
Platelets: 471 10*3/uL — ABNORMAL HIGH (ref 150–400)
RBC: 4.25 MIL/uL (ref 3.87–5.11)
RDW: 15 % (ref 11.5–15.5)
WBC: 3.8 10*3/uL — ABNORMAL LOW (ref 4.0–10.5)

## 2018-04-19 LAB — URINALYSIS, ROUTINE W REFLEX MICROSCOPIC
Bilirubin Urine: NEGATIVE
Glucose, UA: NEGATIVE mg/dL
HGB URINE DIPSTICK: NEGATIVE
Ketones, ur: NEGATIVE mg/dL
Leukocytes, UA: NEGATIVE
Nitrite: NEGATIVE
PH: 5 (ref 5.0–8.0)
Protein, ur: NEGATIVE mg/dL
SPECIFIC GRAVITY, URINE: 1.024 (ref 1.005–1.030)

## 2018-04-19 LAB — POCT PREGNANCY, URINE: Preg Test, Ur: NEGATIVE

## 2018-04-19 MED ORDER — FLUCONAZOLE 150 MG PO TABS: 150.0000 mg | ORAL_TABLET | Freq: Once | ORAL | 0 refills | Status: AC

## 2018-04-19 NOTE — MAU Provider Note (Signed)
History     CSN: [id number]  Arrival date and time: 04/19/18 1851   First Provider Initiated Contact with Patient 04/19/18 2201      Chief Complaint  Patient presents with  . Vaginal Bleeding   Margaret Knight is a 32 y.o. [id number] who is here with multiple complaints including: Pain during sex, vaginal discharge, irregular bleeding, "feeling infant movement in my abdomen" and fatigue. She reports that this has been going on for about one month. She cannot take days off, so she has not been able to make an appointment anywhere. She states that today she thought she saw puss and blood so she was worried and came here.   Vaginal Bleeding  The patient's primary symptoms include vaginal bleeding and vaginal discharge. This is a new problem. The current episode started more than 1 month ago. The problem occurs intermittently. The problem has been unchanged. The patient is experiencing no pain. She is not pregnant. The vaginal discharge was bloody and mucopurulent. The vaginal bleeding is lighter than menses. She has not been passing clots. She has not been passing tissue. The symptoms are aggravated by intercourse. She is sexually active. She uses nothing for contraception. Her menstrual history has been irregular (04/07/18).    Past Medical History:  Diagnosis Date  . Chlamydia   . Chlamydia infection affecting pregnancy 10/09/2016   Rx sent for Zithromax Pt informed  . FH: breast cancer in first degree relative 07/07/2013  . History of recurrent UTI (urinary tract infection)   . Migraines   . Normal pregnancy, repeat 03/09/2012  . SVD (spontaneous vaginal delivery) 03/10/2012    Past Surgical History:  Procedure Laterality Date  . CESAREAN SECTION N/A 09/01/2017   Procedure: CESAREAN SECTION;  Surgeon: Hermina Staggers, MD;  Location: [facility] BIRTHING SUITES;  Service: Obstetrics;  Laterality: N/A;  . DILATION AND CURETTAGE OF UTERUS      Family History  Problem Relation Age of Onset  .  Cancer Mother        breast  . Hypertension Mother   . Diabetes Maternal Grandmother   . Diabetes Paternal Grandmother   . Anesthesia problems Neg Hx     Social History   Tobacco Use  . Smoking status: Never Smoker  . Smokeless tobacco: Never Used  Substance Use Topics  . Alcohol use: No  . Drug use: No    Allergies: No Known Allergies  Medications Prior to Admission  Medication Sig Dispense Refill Last Dose  . ferrous gluconate (FERGON) 324 MG tablet Take 1 tablet (324 mg total) 2 (two) times daily with a meal by mouth. (Patient not taking: Reported on 04/19/2018) 60 tablet 3 Not Taking at Unknown time  . oxyCODONE-acetaminophen (ROXICET) 5-325 MG tablet Take 1 tablet every 6 (six) hours as needed by mouth. (Patient not taking: Reported on 04/19/2018) 20 tablet 0 Not Taking at Unknown time  . Prenatal Vit-Fe Fumarate-FA (PRENATAL VITAMIN) 27-0.8 MG TABS Take 1 tablet by mouth daily. (Patient not taking: Reported on 08/29/2017) 90 tablet 5 Not Taking at Unknown time  . senna-docusate (SENOKOT-S) 8.6-50 MG tablet Take 2 tablets daily by mouth. (Patient not taking: Reported on 04/19/2018) 60 tablet 0 Not Taking at Unknown time    Review of Systems  Genitourinary: Positive for vaginal bleeding and vaginal discharge.   Physical Exam   Blood pressure 129/86, pulse 61, temperature 98.7 F (37.1 C), resp. rate 18, height 5\' 7"  (1.702 m), weight 153 lb (69.4 kg), last menstrual period  04/08/2018, unknown if currently breastfeeding.  Physical Exam  Nursing note and vitals reviewed. Constitutional: She is oriented to person, place, and time. She appears well-developed and well-nourished. No distress.  HENT:  Head: Normocephalic.  Cardiovascular: Normal rate.  Respiratory: Effort normal.  GI: Soft. There is no tenderness.  Genitourinary:  Genitourinary Comments:  External: no lesion Vagina:large amount of thick white clumpy discharge  Cervix: pink, smooth, no CMT Uterus:  NSSC Adnexa: NT   Neurological: She is alert and oriented to person, place, and time.  Skin: Skin is warm and dry.  Psychiatric: She has a normal mood and affect.     Results for orders placed or performed during the hospital encounter of 04/19/18 (from the past 24 hour(s))  Urinalysis, Routine w reflex microscopic     Status: Abnormal   Collection Time: 04/19/18  7:31 PM  Result Value Ref Range   Color, Urine YELLOW YELLOW   APPearance HAZY (A) CLEAR   Specific Gravity, Urine 1.024 1.005 - 1.030   pH 5.0 5.0 - 8.0   Glucose, UA NEGATIVE NEGATIVE mg/dL   Hgb urine dipstick NEGATIVE NEGATIVE   Bilirubin Urine NEGATIVE NEGATIVE   Ketones, ur NEGATIVE NEGATIVE mg/dL   Protein, ur NEGATIVE NEGATIVE mg/dL   Nitrite NEGATIVE NEGATIVE   Leukocytes, UA NEGATIVE NEGATIVE  Pregnancy, urine POC     Status: None   Collection Time: 04/19/18  7:39 PM  Result Value Ref Range   Preg Test, Ur NEGATIVE NEGATIVE  CBC     Status: Abnormal   Collection Time: 04/19/18  8:28 PM  Result Value Ref Range   WBC 3.8 (L) 4.0 - 10.5 K/uL   RBC 4.25 3.87 - 5.11 MIL/uL   Hemoglobin 10.5 (L) 12.0 - 15.0 g/dL   HCT 95.634.2 (L) 21.336.0 - 08.646.0 %   MCV 80.5 78.0 - 100.0 fL   MCH 24.7 (L) 26.0 - 34.0 pg   MCHC 30.7 30.0 - 36.0 g/dL   RDW 57.815.0 46.911.5 - 62.915.5 %   Platelets 471 (H) 150 - 400 K/uL  Wet prep, genital     Status: Abnormal   Collection Time: 04/19/18 10:17 PM  Result Value Ref Range   Yeast Wet Prep HPF POC NONE SEEN NONE SEEN   Trich, Wet Prep NONE SEEN NONE SEEN   Clue Cells Wet Prep HPF POC PRESENT (A) NONE SEEN   WBC, Wet Prep HPF POC MODERATE (A) NONE SEEN   Sperm NONE SEEN     MAU Course  Procedures  MDM  Assessment and Plan   1. Yeast infection involving the vagina and surrounding area    DC home Comfort measures reviewed  RX: diflcuan as directed #2, 0 RF  Return to MAU as needed   Follow-up Information    [facility], [facility].   Contact  information: [address] [place] [address] [phone]           Thressa ShellerHeather Gloyd Happ 04/19/2018, 10:06 PM

## 2018-04-19 NOTE — MAU Note (Addendum)
Something moving in my stomach for 2 wks. Having two periods a month. Bleeding sometimes heavy and sometimes spotting. Very tired. Had c/s in November. Having normal BMs. Went to [facility] to urinate today and saw ? "pus and blood". Cramping in my stomach and pain when having sex. UPT at home negative.

## 2018-04-19 NOTE — Discharge Instructions (Signed)
In late 2019, the [facility] be [facility] to the [place] campus. At that time, the MAU (Maternity Admissions Unit), where you are being seen today, will no longer take care of non-pregnant patients. We strongly encourage you to find a doctor's office before that time, so that you can be seen with any GYN concerns, like vaginal discharge, urinary tract infection, etc.. in a timely manner. ° °In order to make an office visit more convenient, the [facility] will be offering evening hours with same-day appointments, walk-in appointments and scheduled appointments available during this time. ° °[facility] Hours: °Monday - 8am - 7:30 pm with walk-in between 4pm- 7:30 pm °Tuesday - 8 am - 5 pm (starting 01/29/18 we will be open late and accepting walk-ins from 4pm - 7:30pm) °Wednesday - 8 am - 5 pm (starting 05/01/18 we will be open late and accepting walk-ins from 4pm - 7:30pm) °Thursday 8 am - 5 pm (starting 08/01/18 we will be open late and accepting walk-ins from 4pm - 7:30pm) °Friday 8 am - 5 pm ° °For an appointment please call the [facility] at [phone] ° °For urgent needs, [facility] is also available for management of urgent GYN complaints such as vaginal discharge or urinary tract infections. ° ° ° ° ° °

## 2018-04-22 LAB — GC/CHLAMYDIA PROBE AMP (~~LOC~~) NOT AT ARMC
Chlamydia: NEGATIVE
Neisseria Gonorrhea: NEGATIVE

## 2019-03-07 IMAGING — US US OB COMP LESS 14 WK
1 series · 15 of 28 positions shown · non-contrast
Comparison: None.

CLINICAL DATA: Back pain.  Cramping.

EXAM:
OBSTETRIC <14 WK ULTRASOUND
TECHNIQUE: Transabdominal ultrasound was performed for evaluation of the
gestation as well as the maternal uterus and adnexal regions.

[Series 1: us ob comp less 14 wk · 15 of 44 slices shown]
[im 1/44]
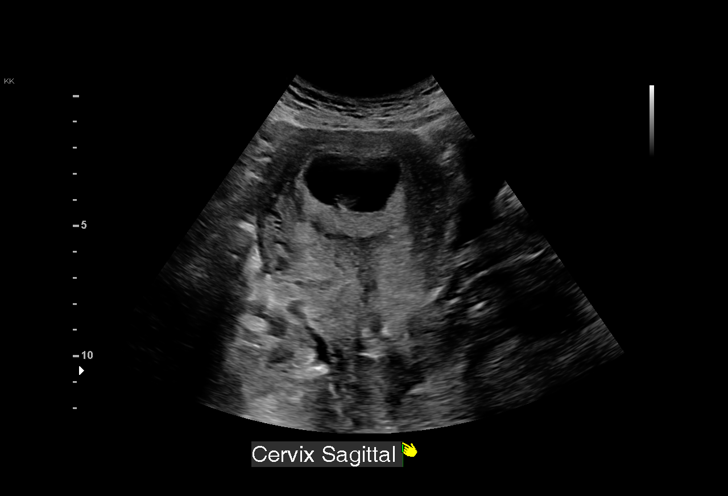
[im 4/44]
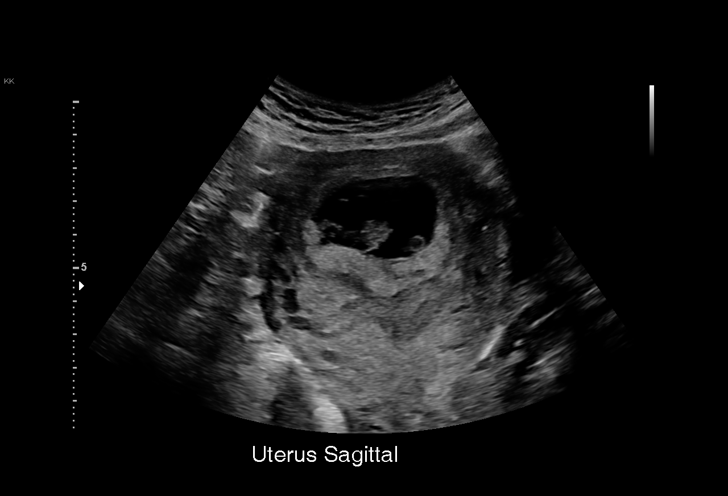
[im 7/44]
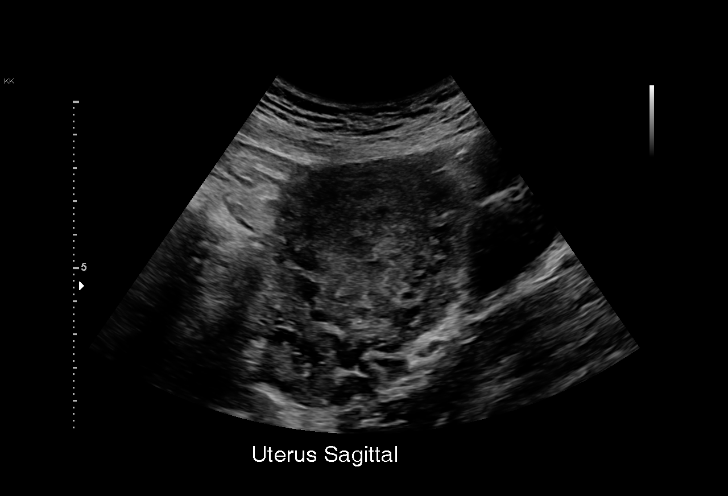
[im 10/44]
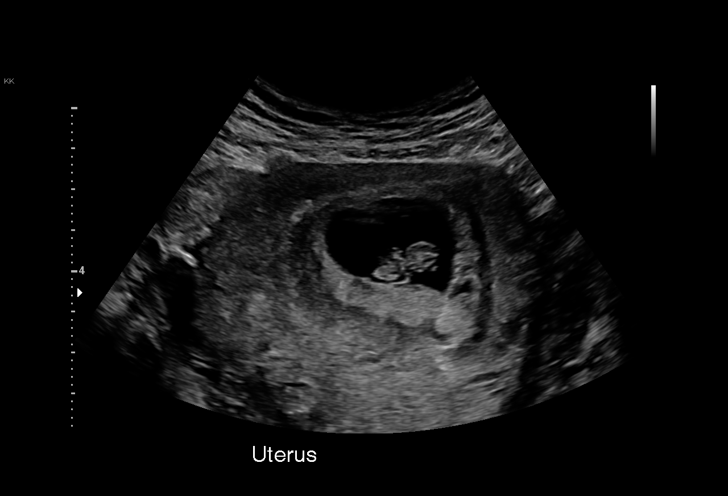
[im 13/44]
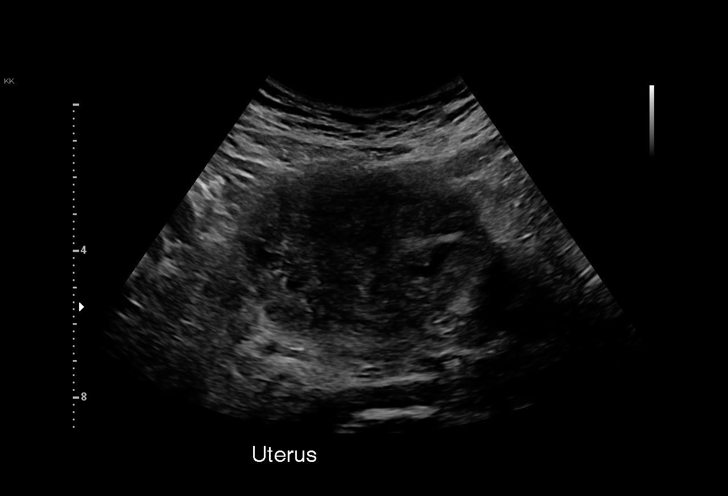
[im 16/44]
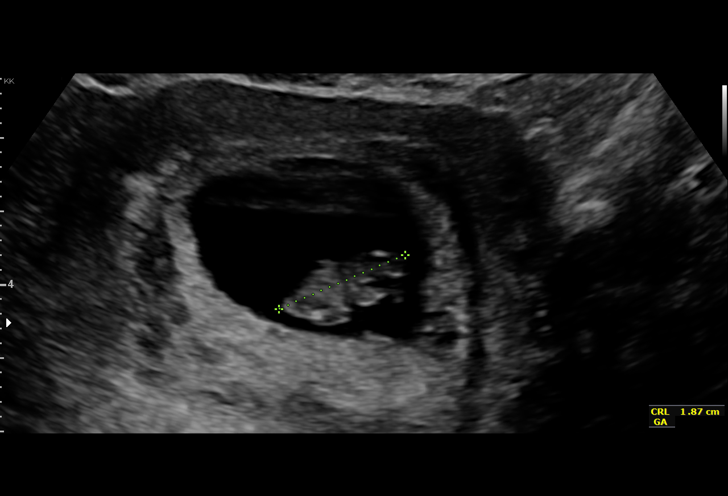
[im 20/44]
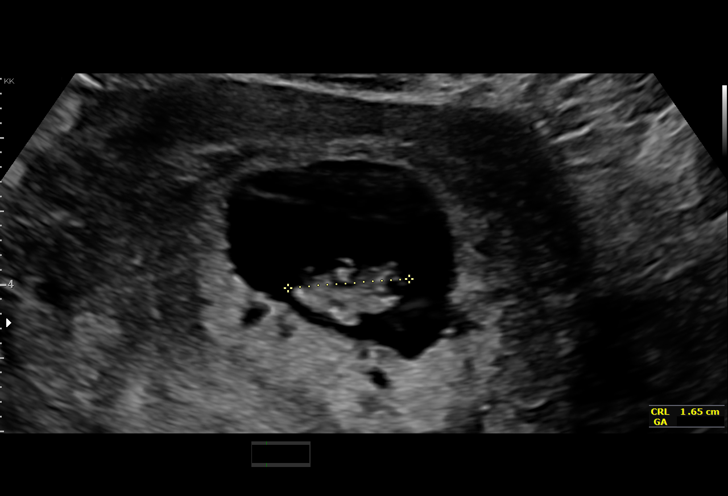
[im 23/44]
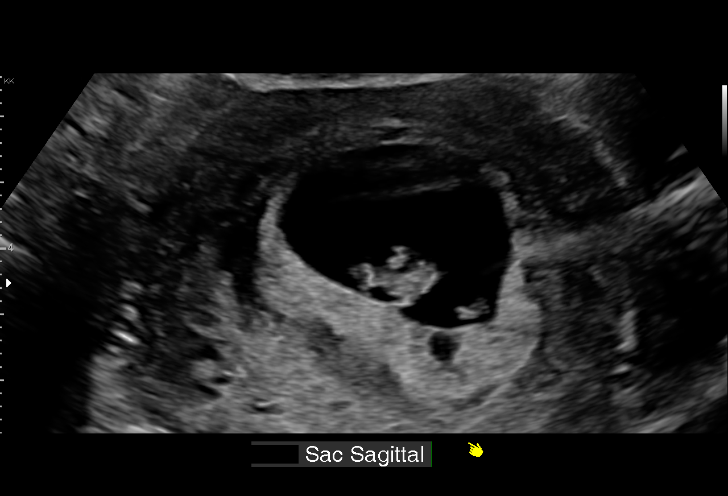
[im 24/44]
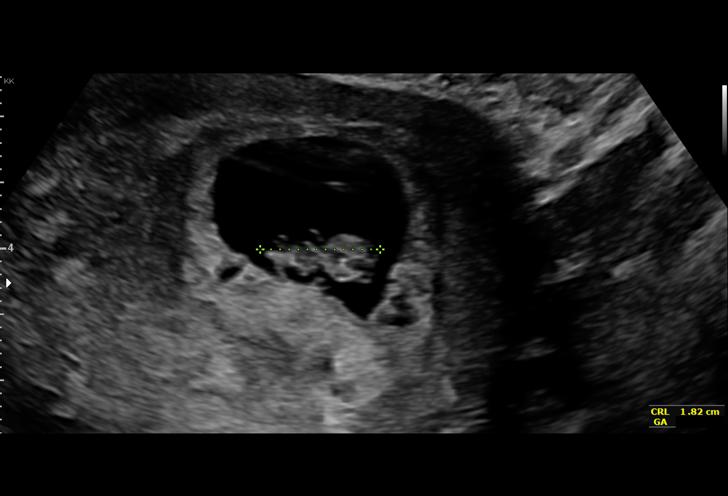
[im 28/44]
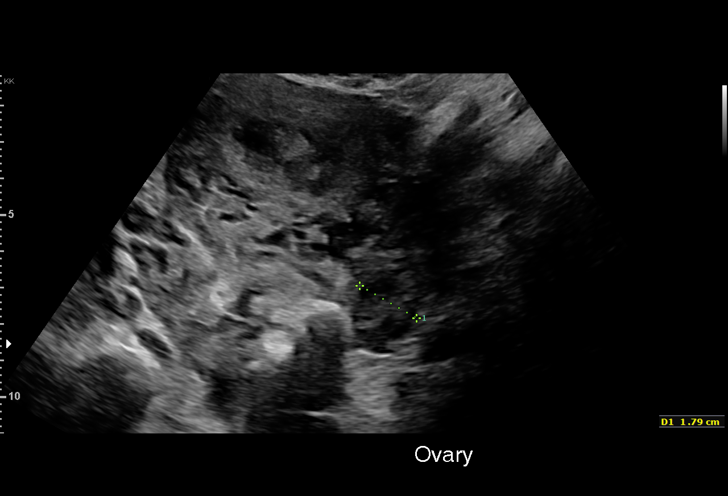
[im 31/44]
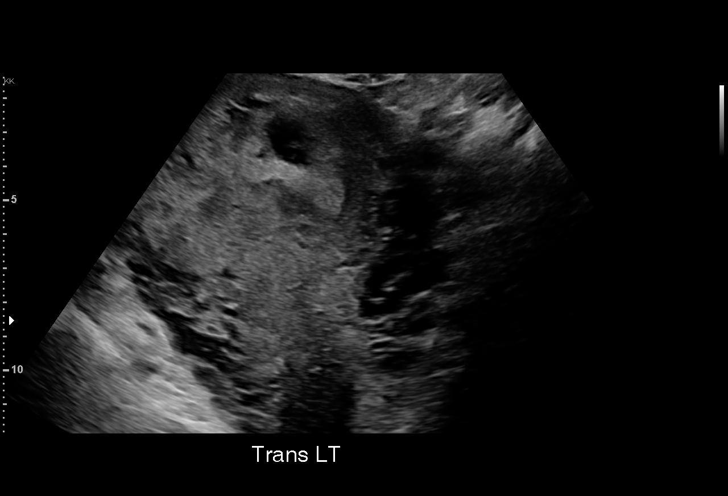
[im 34/44]
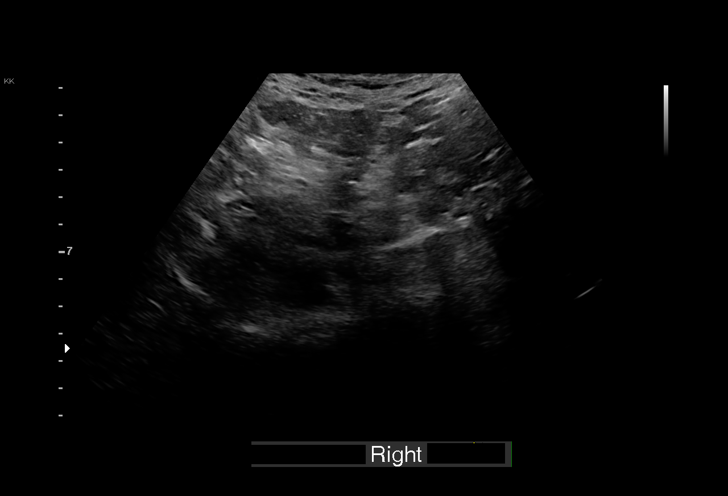
[im 37/44]
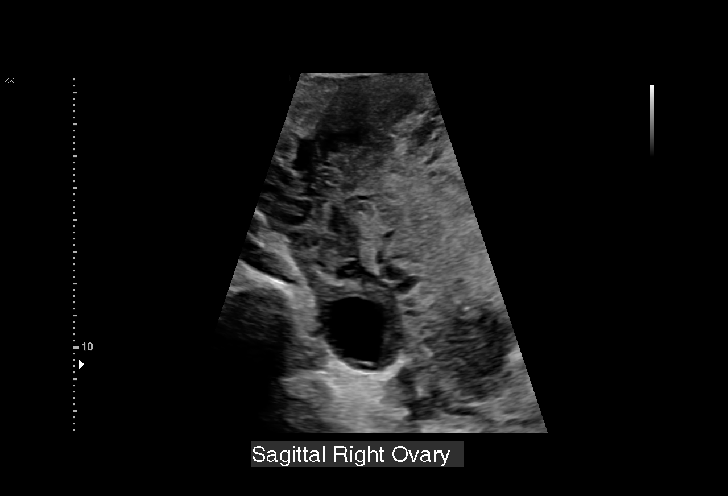
[im 40/44]
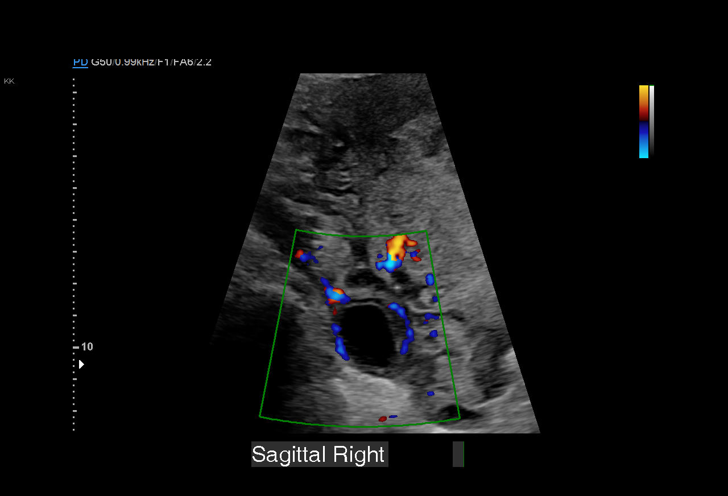
[im 44/44]
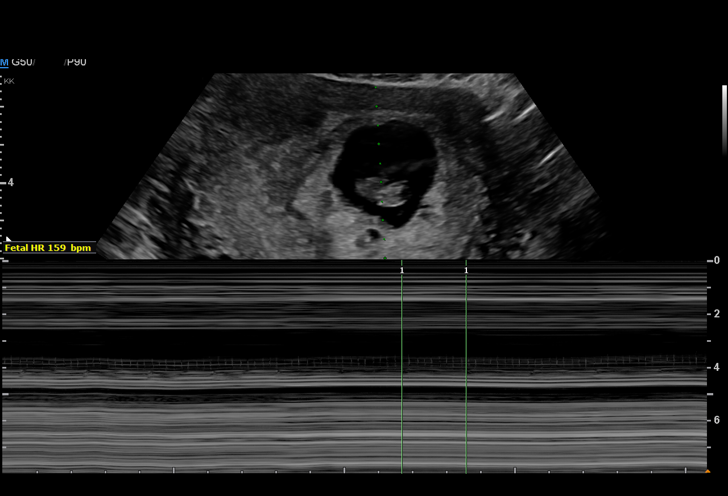

[15 of 28 positions shown; findings below may reference images not displayed]

FINDINGS: Intrauterine gestational sac: Single

Yolk sac:  Visualized.

Embryo:  Visualized.

Cardiac Activity: Visualized.

Heart Rate: 159 bpm

CRL:   17.9  Mm   8 w 1 d                  US EDC: 10/22/2017

Subchorionic hemorrhage:  None visualized.

Maternal uterus/adnexae:

Right ovary: Normal

Left ovary: Normal

Other :None

Free fluid:  None
IMPRESSION: 1. Single living intrauterine gestation with an estimated
gestational age of 8 weeks and 1 day.

## 2019-06-17 ENCOUNTER — Other Ambulatory Visit (HOSPITAL_COMMUNITY): Payer: Self-pay | Admitting: *Deleted

## 2019-06-17 DIAGNOSIS — N644 Mastodynia: Secondary | ICD-10-CM

## 2019-06-19 ENCOUNTER — Ambulatory Visit (HOSPITAL_COMMUNITY)
Admission: RE | Admit: 2019-06-19 | Discharge: 2019-06-19 | Disposition: A | Discharge status: Home / Self Care | Payer: Medicaid Other | Source: Ambulatory Visit | Attending: Obstetrics and Gynecology | Admitting: Obstetrics and Gynecology

## 2019-06-19 ENCOUNTER — Other Ambulatory Visit: Payer: Self-pay

## 2019-06-19 ENCOUNTER — Encounter (HOSPITAL_COMMUNITY): Payer: Self-pay

## 2019-06-19 DIAGNOSIS — N644 Mastodynia: Secondary | ICD-10-CM | POA: Insufficient documentation

## 2019-06-19 DIAGNOSIS — Z1239 Encounter for other screening for malignant neoplasm of breast: Secondary | ICD-10-CM | POA: Insufficient documentation

## 2019-06-19 NOTE — Progress Notes (Signed)
Complaints of left breast diffuse breast pain for over 3 months. Patient states the pain comes and goes. Patient rates the pain at a 7-8 out of 10.  Pap Smear: Pap smear not completed today. Last Pap smear was 07/07/2013 at the [facility] and normal. Per patient has no history of an abnormal Pap smear. Unable to complete Pap smear in [facility] clinic due to patient has [facility]. Patient referred to the [facility] [place] or the [facility] Department to have her Pap smear completed. Last Pap smear result is in Epic.  Physical exam: Breasts Breasts symmetrical. No skin abnormalities bilateral breasts. No nipple retraction bilateral breasts. No nipple discharge bilateral breasts. No lymphadenopathy. No lumps palpated bilateral breasts. Complaints of left outer breast pain on exam. Referred patient to the [facility] for a diagnostic mammogram. Appointment scheduled for Monday, June 23, 2019 at 1240.        Pelvic/Bimanual No Pap smear completed today since patient has [employer].  Smoking History: Patient has never smoked.  Patient Navigation: Patient education provided. Access to services provided for patient through [facility] program.   Breast and Cervical Cancer Risk Assessment: Patient has a family history of her mother having breast cancer. Patient has no known genetic mutations or history of radiation treatment to the chest before age 30. Patient has no history of cervical dysplasia, immunocompromised, or DES exposure in-utero. Breast Cancer risk assessment completed. No breast cancer risk calculated due to patient is less than 58 years old.

## 2019-06-19 NOTE — Patient Instructions (Addendum)
Explained breast self awareness Hedy Camara. Unable to complete Pap smear in [facility] clinic due to patient has [facility]. Patient referred to the [facility] [place] or the [facility] Department to have her Pap smear completed. Let her know Pap smears are recommended every 3 years unless has a history of abnormal Pap smears. Referred patient to the [facility] for a diagnostic mammogram. Appointment scheduled for Monday, June 23, 2019 at 1240.  Patient aware of appointment and will be there. Hedy Camara verbalized understanding.  [place]ann[place], Arvil Chaco, RN 8:47 AM

## 2019-06-23 ENCOUNTER — Other Ambulatory Visit (HOSPITAL_COMMUNITY): Payer: Self-pay | Admitting: Obstetrics and Gynecology

## 2019-06-23 ENCOUNTER — Ambulatory Visit
Admission: RE | Admit: 2019-06-23 | Discharge: 2019-06-23 | Disposition: A | Discharge status: Home / Self Care | Payer: Medicaid Other | Source: Ambulatory Visit | Attending: Obstetrics and Gynecology | Admitting: Obstetrics and Gynecology

## 2019-06-23 ENCOUNTER — Ambulatory Visit
Admission: RE | Admit: 2019-06-23 | Discharge: 2019-06-23 | Disposition: A | Discharge status: Home / Self Care | Payer: No Typology Code available for payment source | Source: Ambulatory Visit | Attending: Obstetrics and Gynecology | Admitting: Obstetrics and Gynecology

## 2019-06-23 ENCOUNTER — Other Ambulatory Visit: Payer: Self-pay

## 2019-06-23 DIAGNOSIS — N6310 Unspecified lump in the right breast, unspecified quadrant: Secondary | ICD-10-CM

## 2019-06-23 DIAGNOSIS — N644 Mastodynia: Secondary | ICD-10-CM

## 2019-06-24 ENCOUNTER — Encounter (HOSPITAL_COMMUNITY): Payer: Self-pay | Admitting: *Deleted

## 2019-07-18 ENCOUNTER — Emergency Department (HOSPITAL_COMMUNITY)
Admission: EM | Admit: 2019-07-18 | Discharge: 2019-07-18 | Discharge status: Against Medical Advice | Payer: Medicaid Other | Attending: Emergency Medicine | Admitting: Emergency Medicine

## 2019-07-18 ENCOUNTER — Other Ambulatory Visit: Payer: Self-pay

## 2019-07-18 ENCOUNTER — Emergency Department (HOSPITAL_COMMUNITY)
Admission: EM | Admit: 2019-07-18 | Discharge: 2019-07-19 | Disposition: A | Discharge status: Home / Self Care | Payer: Medicaid Other | Source: Home / Self Care

## 2019-07-18 ENCOUNTER — Inpatient Hospital Stay: Admission: RE | Admit: 2019-07-18 | Payer: No Typology Code available for payment source | Source: Ambulatory Visit

## 2019-07-18 ENCOUNTER — Encounter (HOSPITAL_COMMUNITY): Payer: Self-pay | Admitting: Emergency Medicine

## 2019-07-18 DIAGNOSIS — Z5321 Procedure and treatment not carried out due to patient leaving prior to being seen by health care provider: Secondary | ICD-10-CM | POA: Insufficient documentation

## 2019-07-18 DIAGNOSIS — R42 Dizziness and giddiness: Secondary | ICD-10-CM | POA: Insufficient documentation

## 2019-07-18 DIAGNOSIS — R51 Headache: Secondary | ICD-10-CM | POA: Insufficient documentation

## 2019-07-18 LAB — CBC
HCT: 36.8 % (ref 36.0–46.0)
Hemoglobin: 11.3 g/dL — ABNORMAL LOW (ref 12.0–15.0)
MCH: 26.4 pg (ref 26.0–34.0)
MCHC: 30.7 g/dL (ref 30.0–36.0)
MCV: 86 fL (ref 80.0–100.0)
Platelets: 428 10*3/uL — ABNORMAL HIGH (ref 150–400)
RBC: 4.28 MIL/uL (ref 3.87–5.11)
RDW: 15.5 % (ref 11.5–15.5)
WBC: 3.9 10*3/uL — ABNORMAL LOW (ref 4.0–10.5)
nRBC: 0 % (ref 0.0–0.2)

## 2019-07-18 LAB — BASIC METABOLIC PANEL
Anion gap: 6 (ref 5–15)
BUN: 10 mg/dL (ref 6–20)
CO2: 21 mmol/L — ABNORMAL LOW (ref 22–32)
Calcium: 9.4 mg/dL (ref 8.9–10.3)
Chloride: 110 mmol/L (ref 98–111)
Creatinine, Ser: 0.8 mg/dL (ref 0.44–1.00)
GFR calc Af Amer: >60 mL/min (ref >60)
GFR calc non Af Amer: >60 mL/min (ref >60)
Glucose, Bld: 98 mg/dL (ref 70–99)
Potassium: 3.6 mmol/L (ref 3.5–5.1)
Sodium: 137 mmol/L (ref 135–145)

## 2019-07-18 LAB — URINALYSIS, ROUTINE W REFLEX MICROSCOPIC
Bacteria, UA: NONE SEEN
Bilirubin Urine: NEGATIVE
Glucose, UA: NEGATIVE mg/dL
Hgb urine dipstick: NEGATIVE
Ketones, ur: NEGATIVE mg/dL
Nitrite: NEGATIVE
Protein, ur: NEGATIVE mg/dL
Specific Gravity, Urine: 1.021 (ref 1.005–1.030)
pH: 6 (ref 5.0–8.0)

## 2019-07-18 LAB — CBG MONITORING, ED: Glucose-Capillary: 107 mg/dL — ABNORMAL HIGH (ref 70–99)

## 2019-07-18 LAB — I-STAT BETA HCG BLOOD, ED (MC, WL, AP ONLY): I-stat hCG, quantitative: <5 m[IU]/mL (ref <5)

## 2019-07-18 MED ORDER — SODIUM CHLORIDE 0.9% FLUSH: 3.0000 mL | Freq: Once | INTRAVENOUS | Status: DC

## 2019-07-18 NOTE — ED Notes (Signed)
Pt LWBS 

## 2019-07-18 NOTE — ED Triage Notes (Signed)
PT endorses dizziness off and on for a week with movement. Pt reports left arm swelling today.

## 2019-07-18 NOTE — ED Triage Notes (Signed)
Pt was seen earlier today but left before being seen.  Now pt has returned with c/o dizziness x's 2 days and a headache x's 1 week.  Pt st's she has no blood flow in left arm and arm is swollen.  Pt's arm is warm, strong radial pulse present, no swelling noted.

## 2019-07-19 NOTE — ED Notes (Signed)
Patient was very belligerent towards staff, tried to explain the process of the emergency room.  Patient was advised to stay, patient left anyway.

## 2019-11-03 ENCOUNTER — Encounter: Payer: Self-pay | Admitting: Gastroenterology

## 2019-11-12 ENCOUNTER — Ambulatory Visit: Payer: No Typology Code available for payment source | Admitting: Gastroenterology

## 2019-11-26 ENCOUNTER — Other Ambulatory Visit: Payer: Self-pay

## 2019-11-26 ENCOUNTER — Encounter (HOSPITAL_BASED_OUTPATIENT_CLINIC_OR_DEPARTMENT_OTHER): Payer: Self-pay | Admitting: Oral Surgery

## 2019-11-27 ENCOUNTER — Other Ambulatory Visit (HOSPITAL_COMMUNITY)
Admission: RE | Admit: 2019-11-27 | Discharge: 2019-11-27 | Disposition: A | Discharge status: Home / Self Care | Payer: Medicaid Other | Source: Ambulatory Visit | Attending: Oral Surgery | Admitting: Oral Surgery

## 2019-11-27 DIAGNOSIS — Z20822 Contact with and (suspected) exposure to covid-19: Secondary | ICD-10-CM | POA: Insufficient documentation

## 2019-11-27 DIAGNOSIS — Z01812 Encounter for preprocedural laboratory examination: Secondary | ICD-10-CM | POA: Diagnosis present

## 2019-11-27 LAB — SARS CORONAVIRUS 2 (TAT 6-24 HRS): SARS Coronavirus 2: NEGATIVE

## 2019-12-01 ENCOUNTER — Ambulatory Visit (HOSPITAL_BASED_OUTPATIENT_CLINIC_OR_DEPARTMENT_OTHER): Payer: Medicaid Other | Admitting: Certified Registered"

## 2019-12-01 ENCOUNTER — Other Ambulatory Visit: Payer: Self-pay

## 2019-12-01 ENCOUNTER — Ambulatory Visit (HOSPITAL_BASED_OUTPATIENT_CLINIC_OR_DEPARTMENT_OTHER)
Admission: RE | Admit: 2019-12-01 | Discharge: 2019-12-01 | Disposition: A | Discharge status: Home / Self Care | Payer: Medicaid Other | Attending: Oral Surgery | Admitting: Oral Surgery

## 2019-12-01 ENCOUNTER — Encounter (HOSPITAL_BASED_OUTPATIENT_CLINIC_OR_DEPARTMENT_OTHER)
Admission: RE | Disposition: A | Discharge status: Home / Self Care | Payer: Self-pay | Source: Home / Self Care | Attending: Oral Surgery

## 2019-12-01 ENCOUNTER — Encounter (HOSPITAL_BASED_OUTPATIENT_CLINIC_OR_DEPARTMENT_OTHER): Payer: Self-pay | Admitting: Oral Surgery

## 2019-12-01 DIAGNOSIS — K011 Impacted teeth: Secondary | ICD-10-CM | POA: Insufficient documentation

## 2019-12-01 DIAGNOSIS — K029 Dental caries, unspecified: Secondary | ICD-10-CM | POA: Insufficient documentation

## 2019-12-01 HISTORY — PX: TOOTH EXTRACTION: SHX859

## 2019-12-01 LAB — POCT PREGNANCY, URINE: Preg Test, Ur: NEGATIVE

## 2019-12-01 SURGERY — EXTRACTION, TOOTH, MOLAR
Anesthesia: General | Site: Mouth

## 2019-12-01 MED ORDER — PROPOFOL 10 MG/ML IV BOLUS
INTRAVENOUS | Status: DC | PRN
  Administered 2019-12-01: 200 mg via INTRAVENOUS

## 2019-12-01 MED ORDER — FENTANYL CITRATE (PF) 250 MCG/5ML IJ SOLN
INTRAMUSCULAR | Status: DC | PRN
  Administered 2019-12-01 (×2): 50 ug via INTRAVENOUS

## 2019-12-01 MED ORDER — PROPOFOL 10 MG/ML IV BOLUS
INTRAVENOUS | Status: AC
  Filled 2019-12-01: qty 20

## 2019-12-01 MED ORDER — FENTANYL CITRATE (PF) 100 MCG/2ML IJ SOLN: 25.0000 ug | INTRAMUSCULAR | Status: DC | PRN

## 2019-12-01 MED ORDER — OXYCODONE HCL 5 MG PO TABS: 5.0000 mg | ORAL_TABLET | Freq: Once | ORAL | Status: DC | PRN

## 2019-12-01 MED ORDER — ACETAMINOPHEN 500 MG PO TABS
1000.0000 mg | ORAL_TABLET | Freq: Once | ORAL | Status: AC
  Administered 2019-12-01: 1000 mg via ORAL

## 2019-12-01 MED ORDER — LIDOCAINE-EPINEPHRINE 2 %-1:100000 IJ SOLN
INTRAMUSCULAR | Status: DC | PRN
  Administered 2019-12-01: 15 mL

## 2019-12-01 MED ORDER — DEXAMETHASONE SODIUM PHOSPHATE 10 MG/ML IJ SOLN
INTRAMUSCULAR | Status: DC | PRN
  Administered 2019-12-01: 5 mg via INTRAVENOUS

## 2019-12-01 MED ORDER — ONDANSETRON HCL 4 MG/2ML IJ SOLN
INTRAMUSCULAR | Status: DC | PRN
  Administered 2019-12-01: 4 mg via INTRAVENOUS

## 2019-12-01 MED ORDER — HYDROCODONE-ACETAMINOPHEN 5-325 MG PO TABS: 1.0000 | ORAL_TABLET | Freq: Four times a day (QID) | ORAL | 0 refills | Status: DC | PRN

## 2019-12-01 MED ORDER — AMOXICILLIN 500 MG PO CAPS: 500.0000 mg | ORAL_CAPSULE | Freq: Three times a day (TID) | ORAL | 0 refills | Status: DC

## 2019-12-01 MED ORDER — MIDAZOLAM HCL 2 MG/2ML IJ SOLN
INTRAMUSCULAR | Status: AC
  Filled 2019-12-01: qty 2

## 2019-12-01 MED ORDER — PROMETHAZINE HCL 25 MG/ML IJ SOLN: 6.2500 mg | INTRAMUSCULAR | Status: DC | PRN

## 2019-12-01 MED ORDER — SCOPOLAMINE 1 MG/3DAYS TD PT72
MEDICATED_PATCH | TRANSDERMAL | Status: AC
  Filled 2019-12-01: qty 1

## 2019-12-01 MED ORDER — MIDAZOLAM HCL 5 MG/5ML IJ SOLN
INTRAMUSCULAR | Status: DC | PRN
  Administered 2019-12-01: 2 mg via INTRAVENOUS

## 2019-12-01 MED ORDER — CEFAZOLIN SODIUM-DEXTROSE 2-4 GM/100ML-% IV SOLN
2.0000 g | INTRAVENOUS | Status: AC
  Administered 2019-12-01: 2 g via INTRAVENOUS

## 2019-12-01 MED ORDER — ONDANSETRON HCL 4 MG/2ML IJ SOLN
INTRAMUSCULAR | Status: AC
  Filled 2019-12-01: qty 2

## 2019-12-01 MED ORDER — SODIUM CHLORIDE 0.9 % IV SOLN
INTRAVENOUS | Status: AC | PRN
  Administered 2019-12-01: 500 mL

## 2019-12-01 MED ORDER — LIDOCAINE-EPINEPHRINE 2 %-1:100000 IJ SOLN
INTRAMUSCULAR | Status: AC
  Filled 2019-12-01: qty 1

## 2019-12-01 MED ORDER — MIDAZOLAM HCL 2 MG/2ML IJ SOLN: 1.0000 mg | INTRAMUSCULAR | Status: DC | PRN

## 2019-12-01 MED ORDER — BACITRACIN ZINC 500 UNIT/GM EX OINT
TOPICAL_OINTMENT | CUTANEOUS | Status: AC
  Filled 2019-12-01: qty 0.9

## 2019-12-01 MED ORDER — LIDOCAINE 2% (20 MG/ML) 5 ML SYRINGE
INTRAMUSCULAR | Status: DC | PRN
  Administered 2019-12-01: 100 mg via INTRAVENOUS

## 2019-12-01 MED ORDER — OXYCODONE HCL 5 MG/5ML PO SOLN: 5.0000 mg | Freq: Once | ORAL | Status: DC | PRN

## 2019-12-01 MED ORDER — FENTANYL CITRATE (PF) 100 MCG/2ML IJ SOLN: 50.0000 ug | INTRAMUSCULAR | Status: DC | PRN

## 2019-12-01 MED ORDER — SCOPOLAMINE 1 MG/3DAYS TD PT72
1.0000 | MEDICATED_PATCH | Freq: Once | TRANSDERMAL | Status: DC
  Administered 2019-12-01: 1.5 mg via TRANSDERMAL

## 2019-12-01 MED ORDER — FENTANYL CITRATE (PF) 100 MCG/2ML IJ SOLN
INTRAMUSCULAR | Status: AC
  Filled 2019-12-01: qty 2

## 2019-12-01 MED ORDER — CEFAZOLIN SODIUM-DEXTROSE 2-4 GM/100ML-% IV SOLN
INTRAVENOUS | Status: AC
  Filled 2019-12-01: qty 100

## 2019-12-01 MED ORDER — ACETAMINOPHEN 500 MG PO TABS
ORAL_TABLET | ORAL | Status: AC
  Filled 2019-12-01: qty 2

## 2019-12-01 MED ORDER — DEXAMETHASONE SODIUM PHOSPHATE 10 MG/ML IJ SOLN
INTRAMUSCULAR | Status: AC
  Filled 2019-12-01: qty 1

## 2019-12-01 MED ORDER — LACTATED RINGERS IV SOLN: INTRAVENOUS | Status: DC

## 2019-12-01 SURGICAL SUPPLY — 49 items
BLADE SURG 15 STRL LF DISP TIS (BLADE) ×1 IMPLANT
BLADE SURG 15 STRL SS (BLADE) ×2
BNDG EYE OVAL (GAUZE/BANDAGES/DRESSINGS) ×6 IMPLANT
BUR CROSS CUT FISSURE 1.6 (BURR) ×2 IMPLANT
BUR CROSS CUT FISSURE 1.6MM (BURR) ×1
BUR EGG ELITE 4.0 (BURR) IMPLANT
BUR EGG ELITE 4.0MM (BURR)
CANISTER SUCT 1200ML W/VALVE (MISCELLANEOUS) ×3 IMPLANT
CLOSURE WOUND 1/2 X4 (GAUZE/BANDAGES/DRESSINGS)
COVER BACK TABLE 60X90IN (DRAPES) ×3 IMPLANT
COVER MAYO STAND STRL (DRAPES) ×3 IMPLANT
COVER WAND RF STERILE (DRAPES) IMPLANT
DECANTER SPIKE VIAL GLASS SM (MISCELLANEOUS) ×3 IMPLANT
DRAPE U-SHAPE 76X120 STRL (DRAPES) ×3 IMPLANT
GAUZE PACKING FOLDED 2  STR (GAUZE/BANDAGES/DRESSINGS) ×2
GAUZE PACKING FOLDED 2 STR (GAUZE/BANDAGES/DRESSINGS) ×1 IMPLANT
GAUZE PACKING IODOFORM 1/4X15 (GAUZE/BANDAGES/DRESSINGS) IMPLANT
GLOVE BIO SURGEON STRL SZ 6.5 (GLOVE) IMPLANT
GLOVE BIO SURGEON STRL SZ7 (GLOVE) ×3 IMPLANT
GLOVE BIO SURGEON STRL SZ7.5 (GLOVE) ×3 IMPLANT
GLOVE BIO SURGEONS STRL SZ 6.5 (GLOVE)
GLOVE BIOGEL PI IND STRL 6.5 (GLOVE) IMPLANT
GLOVE BIOGEL PI IND STRL 7.5 (GLOVE) ×1 IMPLANT
GLOVE BIOGEL PI INDICATOR 6.5 (GLOVE)
GLOVE BIOGEL PI INDICATOR 7.5 (GLOVE) ×2
GOWN STRL REUS W/ TWL LRG LVL3 (GOWN DISPOSABLE) IMPLANT
GOWN STRL REUS W/ TWL XL LVL3 (GOWN DISPOSABLE) ×2 IMPLANT
GOWN STRL REUS W/TWL LRG LVL3 (GOWN DISPOSABLE)
GOWN STRL REUS W/TWL XL LVL3 (GOWN DISPOSABLE) ×4
IV NS 500ML (IV SOLUTION) ×2
IV NS 500ML BAXH (IV SOLUTION) ×1 IMPLANT
NEEDLE HYPO 22GX1.5 SAFETY (NEEDLE) ×3 IMPLANT
NS IRRIG 1000ML POUR BTL (IV SOLUTION) ×3 IMPLANT
PACK BASIN DAY SURGERY FS (CUSTOM PROCEDURE TRAY) ×3 IMPLANT
SLEEVE IRRIGATION ELITE 7 (MISCELLANEOUS) ×3 IMPLANT
SLEEVE SCD COMPRESS KNEE MED (MISCELLANEOUS) ×3 IMPLANT
SPONGE SURGIFOAM ABS GEL 12-7 (HEMOSTASIS) IMPLANT
STRIP CLOSURE SKIN 1/2X4 (GAUZE/BANDAGES/DRESSINGS) IMPLANT
SUT CHROMIC 3 0 PS 2 (SUTURE) ×3 IMPLANT
SYR 20ML LL LF (SYRINGE) ×3 IMPLANT
SYR BULB 3OZ (MISCELLANEOUS) ×3 IMPLANT
SYR CONTROL 10ML LL (SYRINGE) ×3 IMPLANT
TOOTHBRUSH ADULT (PERSONAL CARE ITEMS) IMPLANT
TOWEL GREEN STERILE FF (TOWEL DISPOSABLE) ×3 IMPLANT
TRAY DSU PREP LF (CUSTOM PROCEDURE TRAY) IMPLANT
TUBE CONNECTING 20'X1/4 (TUBING) ×1
TUBE CONNECTING 20X1/4 (TUBING) ×2 IMPLANT
TUBING IRRIGATION (MISCELLANEOUS) ×3 IMPLANT
YANKAUER SUCT BULB TIP NO VENT (SUCTIONS) ×3 IMPLANT

## 2019-12-01 NOTE — Transfer of Care (Signed)
Immediate Anesthesia Transfer of Care Note  Patient: Margaret Knight  Procedure(s) Performed: THIRD EXTRACTION MOLARS (N/A Mouth)  Patient Location: PACU  Anesthesia Type:General  Level of Consciousness: awake, alert , oriented and patient cooperative  Airway & Oxygen Therapy: Patient Spontanous Breathing and Patient connected to nasal cannula oxygen  Post-op Assessment: Report given to RN, Post -op Vital signs reviewed and stable and Patient moving all extremities  Post vital signs: Reviewed and stable  Last Vitals:  Vitals Value Taken Time  BP 137/86 12/01/19 1257  Temp    Pulse 101 12/01/19 1259  Resp 25 12/01/19 1259  SpO2 99 % 12/01/19 1259  Vitals shown include unvalidated device data.  Last Pain:  Vitals:   12/01/19 1054  TempSrc: Temporal  PainSc: 0-No pain         Complications: No apparent anesthesia complications

## 2019-12-01 NOTE — Op Note (Signed)
NAMEJOSCELIN, FRAY [facility] MEDICAL RECORD IR:[id number] ACCOUNT [id number] DATE OF BIRTH:12-02-1985 FACILITY: [facility] LOCATION: [facility] PHYSICIAN:Sully Manzi M. Almedia Cordell, DDS  OPERATIVE REPORT  DATE OF PROCEDURE:  12/01/2019  PREOPERATIVE DIAGNOSIS:  Nonrestorable and impacted teeth numbers 1, 16, 17 and 32.  POSTOPERATIVE DIAGNOSIS:  Nonrestorable and impacted teeth numbers 1, 16, 17 and 32.  PROCEDURE:  Extraction of teeth numbers 1, 16, 17 and 32.  SURGEON:  Ocie Doyne, DDS  ANESTHESIA:  General, nasal intubation.  INDICATION FOR PROCEDURE:  The patient is a 34 year old female who was having severe pain in the upper left wisdom tooth.  She had dental caries into the pulp in teeth numbers 1 and 16, as well as #17 and the she had a complete bony impacted tooth #32.   It was recommended that she be asleep for the procedure, so it was scheduled at day surgery.  DESCRIPTION OF PROCEDURE:  The patient was taken to the operating room and placed on the table in supine position.  General anesthesia was administered and a nasal endotracheal tube was placed and secured.  The eyes were protected.  The patient was  draped for surgery.  A timeout was performed.  The posterior pharynx was suctioned and a throat pack was placed.  Two percent lidocaine with 1:100,000 epinephrine was infiltrated in an inferior alveolar block on the right and left sides and buccal and  palatal infiltration around the maxillary teeth numbers 1  and 16.  A total of approximately 14 mL was utilized.  There was a leak in the anesthesia circuit which required removal of the throat pack at one point, but once it was determined that the leak  was not in the endotracheal tube, but was in the endobag, the throat pack was repositioned into normal place.  A 15 blade was used to make an incision around teeth numbers 16 and 17.  The periosteum was reflected.  The teeth were elevated and removed  from the mouth with the dental forceps.   The sockets were curetted, irrigated and closed with 3-0 chromic.  The bite block was repositioned to the other side of the mouth and a 15 blade was used to make an incision around tooth #1 and overlying tooth  #32.  The periosteum was reflected from around these teeth.  Bone was removed around tooth #32 and the tooth was sectioned into multiple pieces and removed with a 301 elevator and rongeurs.  Then, tooth #1 was elevated with a 301 elevator and removed  from the mouth with the dental forceps.  The sockets were curetted, irrigated and closed with 3-0 chromic.  Then, the oral cavity was irrigated and suctioned.  The throat pack was removed.  The patient was left in care of anesthesia for extubation with  plans for discharge home through day surgery.  ESTIMATED BLOOD LOSS:  Minimal.  COMPLICATIONS:  None.  SPECIMENS:  None.  VN/NUANCE  D:12/01/2019 T:12/01/2019 JOB:[id number]

## 2019-12-01 NOTE — Anesthesia Postprocedure Evaluation (Signed)
Anesthesia Post Note  Patient: Margaret Knight  Procedure(s) Performed: THIRD EXTRACTION MOLARS (N/A Mouth)     Patient location during evaluation: PACU Anesthesia Type: General Level of consciousness: awake and alert and oriented Pain management: pain level controlled Vital Signs Assessment: post-procedure vital signs reviewed and stable Respiratory status: spontaneous breathing, nonlabored ventilation and respiratory function stable Cardiovascular status: blood pressure returned to baseline Postop Assessment: no apparent nausea or vomiting Anesthetic complications: no    Last Vitals:  Vitals:   12/01/19 1315 12/01/19 1330  BP: 132/90   Pulse: 87 86  Resp: 19 17  Temp:    SpO2: 100% 100%    Last Pain:  Vitals:   12/01/19 1300  TempSrc:   PainSc: 0-No pain                 Kaylyn Layer

## 2019-12-01 NOTE — Anesthesia Preprocedure Evaluation (Addendum)
Anesthesia Evaluation  Patient identified by MRN, date of birth, ID band Patient awake    Reviewed: Allergy & Precautions, NPO status , Patient's Chart, lab work & pertinent test results  History of Anesthesia Complications Negative for: history of anesthetic complications  Airway Mallampati: II  TM Distance: >3 FB Neck ROM: Full    Dental no notable dental hx.    Pulmonary neg pulmonary ROS,    Pulmonary exam normal        Cardiovascular negative cardio ROS Normal cardiovascular exam     Neuro/Psych  Headaches, negative psych ROS   GI/Hepatic negative GI ROS, Neg liver ROS,   Endo/Other  negative endocrine ROS  Renal/GU negative Renal ROS  negative genitourinary   Musculoskeletal negative musculoskeletal ROS (+)   Abdominal   Peds  Hematology negative hematology ROS (+)   Anesthesia Other Findings Day of surgery medications reviewed with patient.  Reproductive/Obstetrics negative OB ROS                            Anesthesia Physical Anesthesia Plan  ASA: I  Anesthesia Plan: General   Post-op Pain Management:    Induction: Intravenous  PONV Risk Score and Plan: 4 or greater and Treatment may vary due to age or medical condition, Ondansetron, Dexamethasone, Midazolam and Scopolamine patch - Pre-op  Airway Management Planned: Nasal ETT  Additional Equipment: None  Intra-op Plan:   Post-operative Plan: Extubation in OR  Informed Consent: I have reviewed the patients History and Physical, chart, labs and discussed the procedure including the risks, benefits and alternatives for the proposed anesthesia with the patient or authorized representative who has indicated his/her understanding and acceptance.     Dental advisory given  Plan Discussed with: CRNA  Anesthesia Plan Comments:        Anesthesia Quick Evaluation

## 2019-12-01 NOTE — H&P (Signed)
HISTORY AND PHYSICAL  Margaret Knight is a 34 y.o. female patient with CC: painful wisdom teeth  No diagnosis found.  Past Medical History:  Diagnosis Date  . Chlamydia   . Chlamydia infection affecting pregnancy 10/09/2016   Rx sent for Zithromax Pt informed  . FH: breast cancer in first degree relative 07/07/2013  . History of recurrent UTI (urinary tract infection)   . Migraines   . Normal pregnancy, repeat 03/09/2012  . SVD (spontaneous vaginal delivery) 03/10/2012    Current Facility-Administered Medications  Medication Dose Route Frequency Provider Last Rate Last Admin  . ceFAZolin (ANCEF) IVPB 2g/100 mL premix  2 g Intravenous On Call to OR Ocie Doyne, DDS      . fentaNYL (SUBLIMAZE) injection 50 mcg  50 mcg Intravenous Q5 min PRN Lewie Loron, MD      . lactated ringers infusion   Intravenous Continuous Lewie Loron, MD 10 mL/hr at 12/01/19 1104 New Bag at 12/01/19 1104  . midazolam (VERSED) injection 1-2 mg  1-2 mg Intravenous Q5 min PRN Lewie Loron, MD      . scopolamine (TRANSDERM-SCOP) 1 MG/3DAYS 1.5 mg  1 patch Transdermal Once Kaylyn Layer, MD   1.5 mg at 12/01/19 1103   No Known Allergies Active Problems:   * No active hospital problems. *  Vitals: Blood pressure 125/86, pulse 78, temperature 98.2 F (36.8 C), temperature source Temporal, resp. rate 16, height 5\' 7"  (1.702 m), weight 82.5 kg, last menstrual period 11/08/2019, SpO2 98 %, unknown if currently breastfeeding. Lab results: Results for orders placed or performed during the hospital encounter of 12/01/19 (from the past 24 hour(s))  Pregnancy, urine POC     Status: None   Collection Time: 12/01/19 10:42 AM  Result Value Ref Range   Preg Test, Ur NEGATIVE NEGATIVE   Radiology Results: No results found. General appearance: alert, cooperative and no distress Head: Normocephalic, without obvious abnormality, atraumatic Eyes: conjunctivae/corneas clear. PERRL, EOM's intact. Fundi  benign. Nose: Nares normal. Septum midline. Mucosa normal. No drainage or sinus tenderness. Throat: lips, mucosa, and tongue normal; teeth and gums normal and impacted wisdom teeth Neck: no adenopathy and supple, symmetrical, trachea midline Resp: clear to auscultation bilaterally Cardio: regular rate and rhythm, S1, S2 normal, no murmur, click, rub or gallop  Assessment: Impacted wisdom teeth  Plan: Extraction wisdom teeth. GA, day surgery  01/29/20 12/01/2019

## 2019-12-01 NOTE — Discharge Instructions (Signed)
  Post Anesthesia Home Care Instructions  Activity: Get plenty of rest for the remainder of the day. A responsible individual must stay with you for 24 hours following the procedure.  For the next 24 hours, DO NOT: -Drive a car -Advertising copywriter -Drink alcoholic beverages -Take any medication unless instructed by your physician -Make any legal decisions or sign important papers.  Meals: Start with liquid foods such as gelatin or soup. Progress to regular foods as tolerated. Avoid greasy, spicy, heavy foods. If nausea and/or vomiting occur, drink only clear liquids until the nausea and/or vomiting subsides. Call your physician if vomiting continues.  Special Instructions/Symptoms: Your throat may feel dry or sore from the anesthesia or the breathing tube placed in your throat during surgery. If this causes discomfort, gargle with warm salt water. The discomfort should disappear within 24 hours.  If you had a scopolamine patch placed behind your ear for the management of post- operative nausea and/or vomiting:  1. The medication in the patch is effective for 72 hours, after which it should be removed.  Wrap patch in a tissue and discard in the trash. Wash hands thoroughly with soap and water. 2. You may remove the patch earlier than 72 hours if you experience unpleasant side effects which may include dry mouth, dizziness or visual disturbances. 3. Avoid touching the patch. Wash your hands with soap and water after contact with the patch.    No tylenol until after 5pm

## 2019-12-01 NOTE — Op Note (Signed)
12/01/2019  12:46 PM  PATIENT:  Margaret Knight  34 y.o. female  PRE-OPERATIVE DIAGNOSIS:  NON RESTORABLE/Impacted teeth #1, 16, 17, 32  POST-OPERATIVE DIAGNOSIS:  SAME  PROCEDURE:  Procedure(s): THIRD EXTRACTION MOLARS 1, 16, 17, 32  SURGEON:  Surgeon(s): Ocie Doyne, DDS  ANESTHESIA:   local and general  EBL:  minimal  DRAINS: none   SPECIMEN:  No Specimen  COUNTS:  YES  PLAN OF CARE: Discharge to home after PACU  PATIENT DISPOSITION:  PACU - hemodynamically stable.   PROCEDURE DETAILS: Dictation # [id number]  Georgia Lopes, DMD 12/01/2019 12:46 PM

## 2019-12-01 NOTE — Anesthesia Procedure Notes (Signed)
Procedure Name: Intubation Date/Time: 12/01/2019 12:18 PM Performed by: Myna Bright, CRNA Pre-anesthesia Checklist: Patient identified, Emergency Drugs available, Suction available and Patient being monitored Patient Re-evaluated:Patient Re-evaluated prior to induction Oxygen Delivery Method: Circle system utilized Preoxygenation: Pre-oxygenation with 100% oxygen Induction Type: IV induction Ventilation: Mask ventilation without difficulty Laryngoscope Size: Mac and 3 Grade View: Grade I Nasal Tubes: Right and Nasal Rae Tube size: 7.0 mm Number of attempts: 1 Placement Confirmation: positive ETCO2 and breath sounds checked- equal and bilateral Tube secured with: Tape Dental Injury: Teeth and Oropharynx as per pre-operative assessment

## 2019-12-02 ENCOUNTER — Encounter: Payer: Self-pay | Admitting: *Deleted

## 2021-06-16 IMAGING — MG DIGITAL DIAGNOSTIC BILATERAL MAMMOGRAM WITH TOMO AND CAD
8 series · 8 of 24 positions shown · non-contrast
Comparison: None.

CLINICAL DATA: Intermittent pain in the lower outer quadrant of the
left breast for the past 3 months. Her mother was diagnosed breast
cancer at age 35. She states that her mother refused genetic testing
and the patient has not been tested for breast cancer genes.

EXAM:
DIGITAL DIAGNOSTIC BILATERAL MAMMOGRAM WITH CAD AND TOMO
ULTRASOUND LEFT BREAST

[L MLO synth-2D]
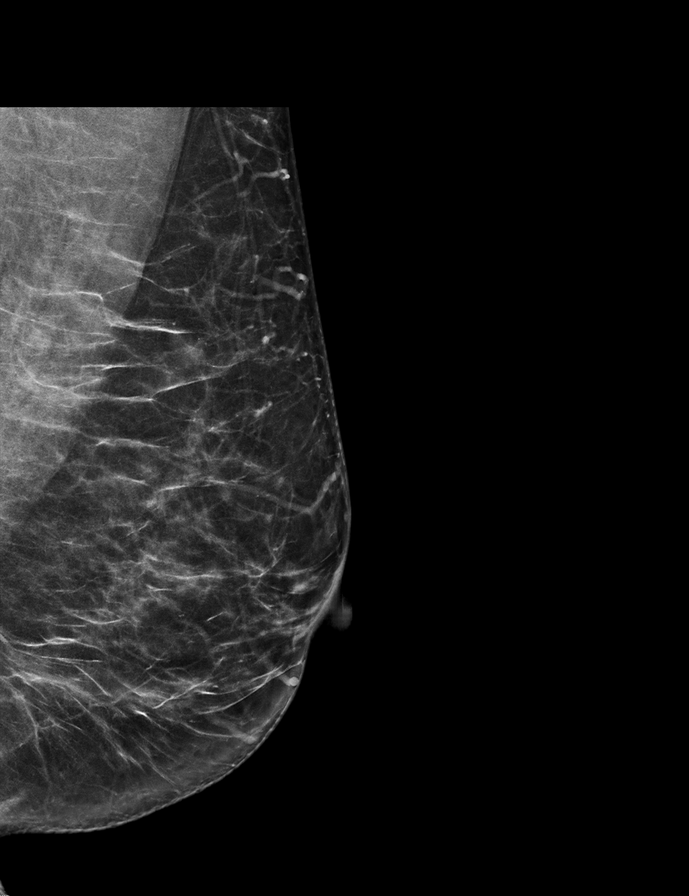

[R MLO synth-2D]
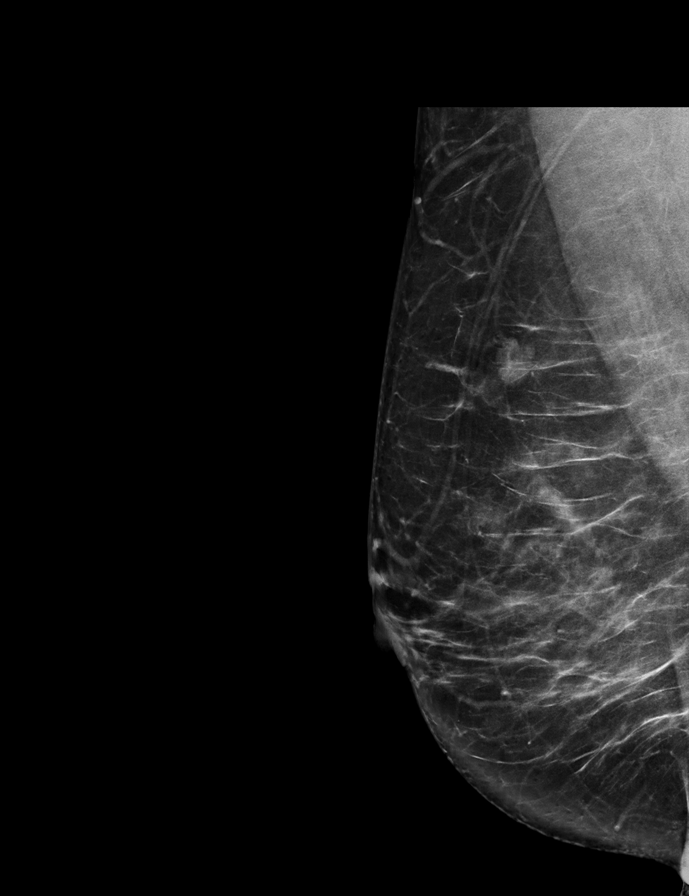

[R CC synth-2D]
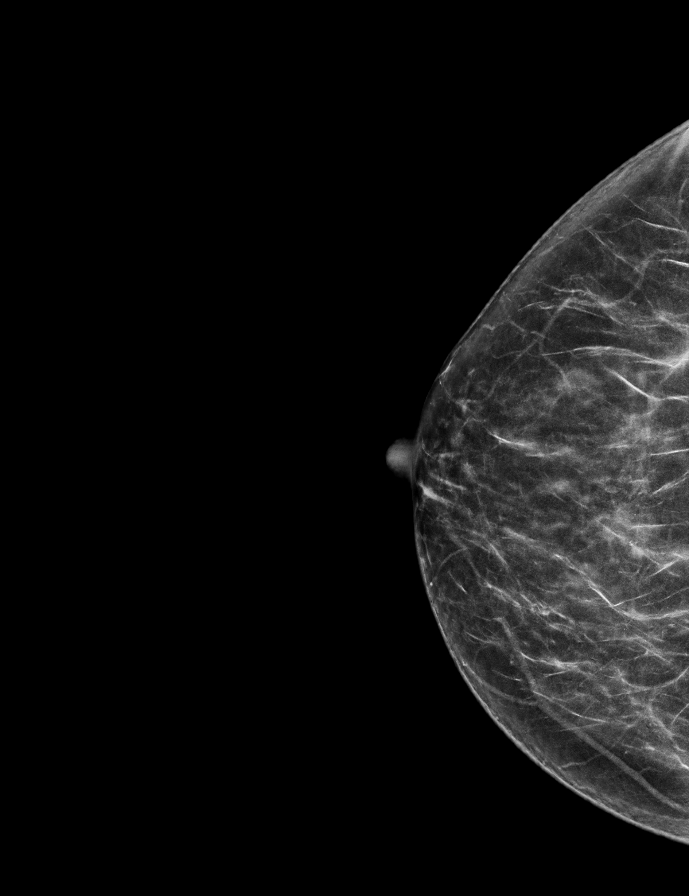

[L CC synth-2D]
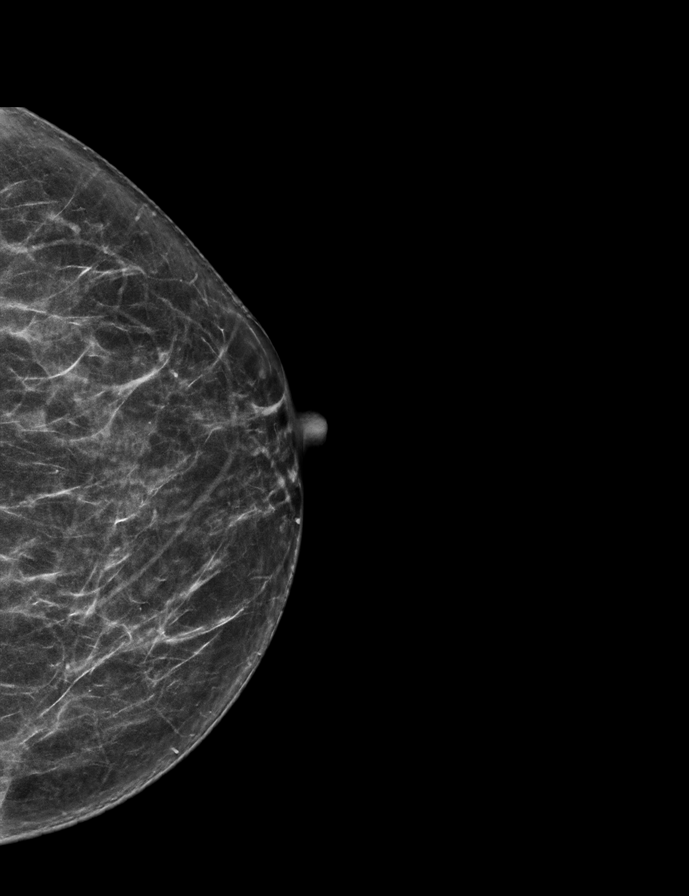

[R CC tomo · tomo slice 31/60.0]
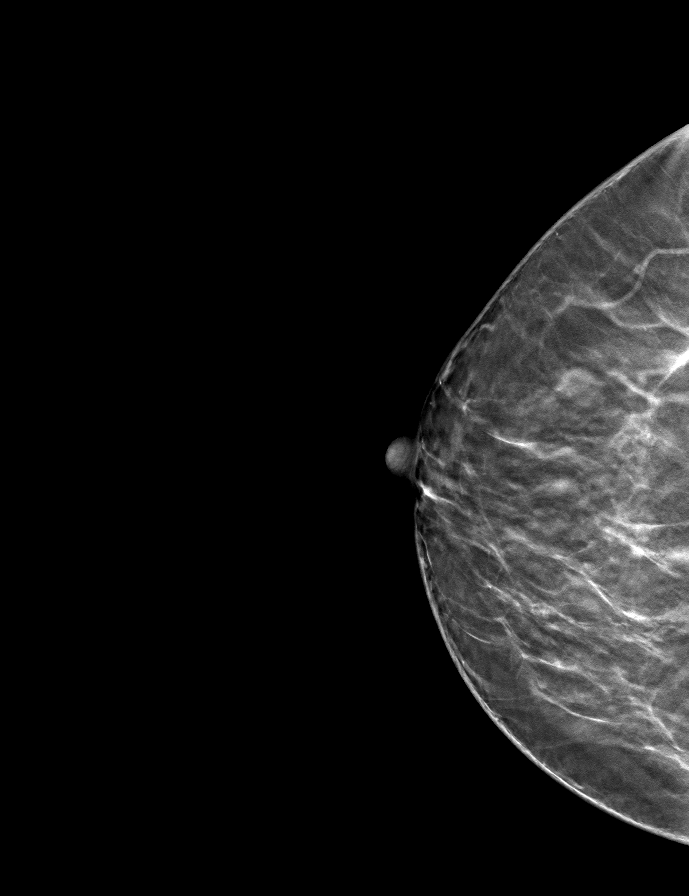

[L CC tomo · tomo slice 30/59.0]
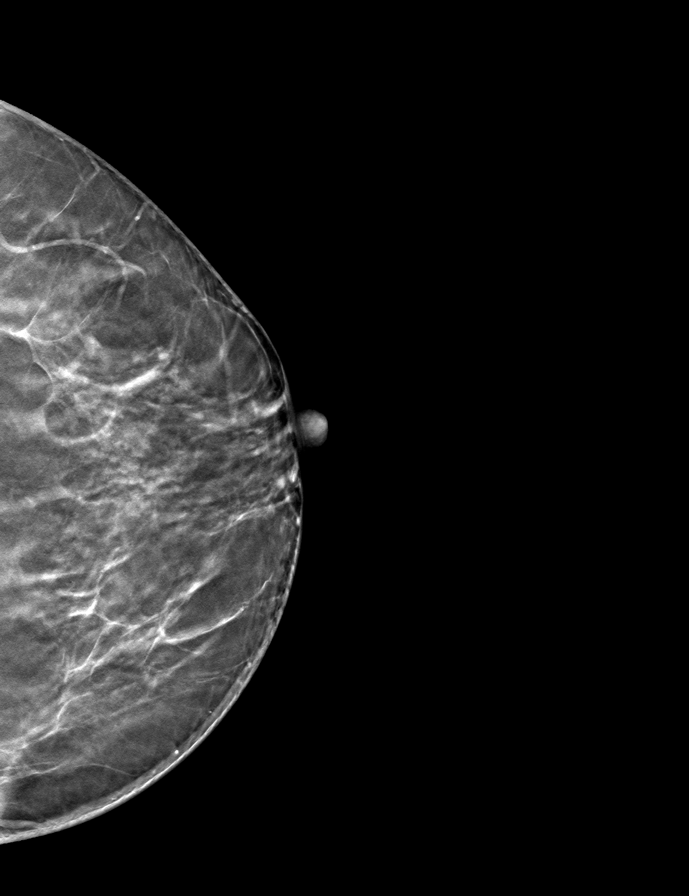

[L MLO tomo · tomo slice 34/67.0]
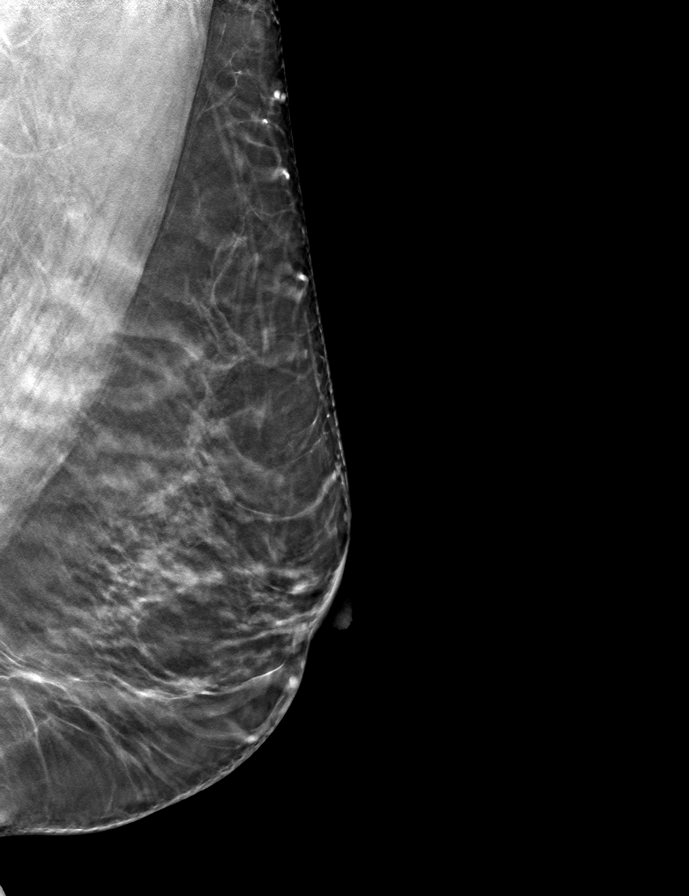

[R MLO tomo · tomo slice 37/73.0]
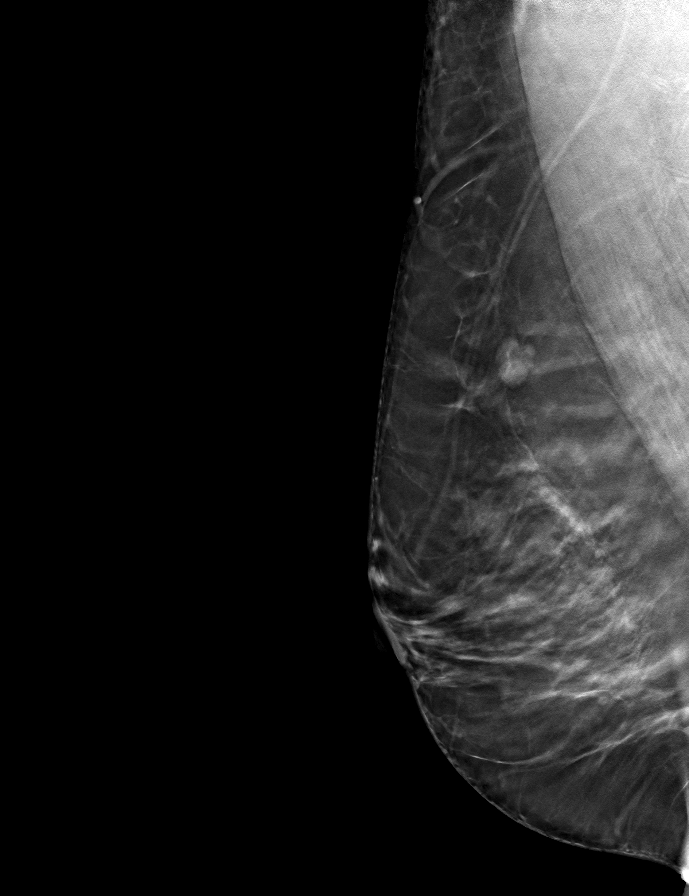

[8 of 24 positions shown; findings below may reference images not displayed]

ACR Breast Density Category b: There are scattered areas of
fibroglandular density.
FINDINGS: There is a multi-lobulated, circumscribed mass in the superior
aspect of the right breast. No abnormality is demonstrated elsewhere
in the right breast. No abnormality is seen in the left breast.

Mammographic images were processed with CAD.

On physical exam, the patient is tender to palpation in the outer
left breast with no palpable mass.

Targeted ultrasound is performed, showing normal appearing breast
tissue throughout the outer left breast in the area of patient pain
and tenderness.

In the 10 o'clock position of the right breast, 6 cm from the
nipple, a normal appearing intramammary lymph node is demonstrated,
corresponding to the mammographic mass.
IMPRESSION: No evidence of malignancy.

RECOMMENDATION:
Bilateral screening mammogram in 1 year. Annual screening
mammography is recommended since the patient's mother was diagnosed
with breast cancer at age 35.

I have discussed the findings and recommendations with the patient.
Results were also provided in writing at the conclusion of the
visit. If applicable, a reminder letter will be sent to the patient
regarding the next appointment.

BI-RADS CATEGORY  2: Benign.

## 2024-09-17 ENCOUNTER — Ambulatory Visit: Payer: Self-pay | Admitting: Physician Assistant

## 2024-09-23 ENCOUNTER — Ambulatory Visit: Payer: Self-pay | Admitting: Internal Medicine

## 2024-11-04 ENCOUNTER — Ambulatory Visit (INDEPENDENT_AMBULATORY_CARE_PROVIDER_SITE_OTHER): Admitting: Internal Medicine

## 2024-11-04 ENCOUNTER — Encounter: Payer: Self-pay | Admitting: Internal Medicine

## 2024-11-04 VITALS — BP 122/82 | HR 75 | Temp 98.7°F | Ht 67.0 in | Wt 189.0 lb

## 2024-11-04 DIAGNOSIS — F4310 Post-traumatic stress disorder, unspecified: Secondary | ICD-10-CM | POA: Diagnosis not present

## 2024-11-04 DIAGNOSIS — D251 Intramural leiomyoma of uterus: Secondary | ICD-10-CM | POA: Insufficient documentation

## 2024-11-04 DIAGNOSIS — N63 Unspecified lump in unspecified breast: Secondary | ICD-10-CM | POA: Insufficient documentation

## 2024-11-04 DIAGNOSIS — R87610 Atypical squamous cells of undetermined significance on cytologic smear of cervix (ASC-US): Secondary | ICD-10-CM | POA: Insufficient documentation

## 2024-11-04 DIAGNOSIS — R140 Abdominal distension (gaseous): Secondary | ICD-10-CM | POA: Insufficient documentation

## 2024-11-04 DIAGNOSIS — Z803 Family history of malignant neoplasm of breast: Secondary | ICD-10-CM | POA: Diagnosis not present

## 2024-11-04 DIAGNOSIS — E559 Vitamin D deficiency, unspecified: Secondary | ICD-10-CM | POA: Insufficient documentation

## 2024-11-04 DIAGNOSIS — D500 Iron deficiency anemia secondary to blood loss (chronic): Secondary | ICD-10-CM | POA: Diagnosis not present
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
## 2024-11-05 ENCOUNTER — Ambulatory Visit: Payer: Self-pay | Admitting: Internal Medicine

## 2024-11-05 DIAGNOSIS — D500 Iron deficiency anemia secondary to blood loss (chronic): Secondary | ICD-10-CM

## 2024-11-05 DIAGNOSIS — E559 Vitamin D deficiency, unspecified: Secondary | ICD-10-CM

## 2024-11-05 LAB — CBC WITH DIFFERENTIAL/PLATELET
Basophils Absolute: 0.1 K/uL (ref 0.0–0.1)
Basophils Relative: 2.1 % (ref 0.0–3.0)
Eosinophils Absolute: 0 K/uL (ref 0.0–0.7)
Eosinophils Relative: 0.8 % (ref 0.0–5.0)
HCT: 37.4 % (ref 36.0–46.0)
Hemoglobin: 11.9 g/dL — ABNORMAL LOW (ref 12.0–15.0)
Lymphocytes Relative: 52.2 % — ABNORMAL HIGH (ref 12.0–46.0)
Lymphs Abs: 1.9 K/uL (ref 0.7–4.0)
MCHC: 31.9 g/dL (ref 30.0–36.0)
MCV: 91.3 fl (ref 78.0–100.0)
Monocytes Absolute: 0.5 K/uL (ref 0.1–1.0)
Monocytes Relative: 12.3 % — ABNORMAL HIGH (ref 3.0–12.0)
Neutro Abs: 1.2 K/uL — ABNORMAL LOW (ref 1.4–7.7)
Neutrophils Relative %: 32.6 % — ABNORMAL LOW (ref 43.0–77.0)
Platelets: 403 K/uL — ABNORMAL HIGH (ref 150.0–400.0)
RBC: 4.1 Mil/uL (ref 3.87–5.11)
RDW: 15.2 % (ref 11.5–15.5)
WBC: 3.7 K/uL — ABNORMAL LOW (ref 4.0–10.5)

## 2024-11-05 LAB — BASIC METABOLIC PANEL WITH GFR
BUN: 10 mg/dL (ref 6–23)
CO2: 24 meq/L (ref 19–32)
Calcium: 9.2 mg/dL (ref 8.4–10.5)
Chloride: 105 meq/L (ref 96–112)
Creatinine, Ser: 0.86 mg/dL (ref 0.40–1.20)
GFR: 85.64 mL/min
Glucose, Bld: 80 mg/dL (ref 70–99)
Potassium: 4.2 meq/L (ref 3.5–5.1)
Sodium: 136 meq/L (ref 135–145)

## 2024-11-05 LAB — TSH: TSH: 2.04 u[IU]/mL (ref 0.35–5.50)

## 2024-11-05 LAB — IBC + FERRITIN
Ferritin: 12 ng/mL (ref 10.0–291.0)
Iron: 44 ug/dL (ref 42–145)
Saturation Ratios: 10.7 % — ABNORMAL LOW (ref 20.0–50.0)
TIBC: 410.2 ug/dL (ref 250.0–450.0)
Transferrin: 293 mg/dL (ref 212.0–360.0)

## 2024-11-05 LAB — VITAMIN D 25 HYDROXY (VIT D DEFICIENCY, FRACTURES): VITD: 20.52 ng/mL — ABNORMAL LOW (ref 30.00–100.00)

## 2024-11-05 MED ORDER — VITAMIN D (ERGOCALCIFEROL) 1.25 MG (50000 UNIT) PO CAPS: 50000.0000 [IU] | ORAL_CAPSULE | ORAL | 0 refills | Status: AC

## 2024-11-05 NOTE — Progress Notes (Signed)
 Hi Margaret Knight,  Your thyroid level, electrolytes, kidney and liver function are all within normal range.  Your blood counts do indicate some anemia, which can be due to the heavy vaginal bleeding you have been having.  Please continue taking your iron  supplement and follow-up with the OB/GYN.  I believe by fixing the vaginal bleeding, this will improve your anemia.  Also, your vitamin D  level is low, I would recommend we restart your vitamin D  supplement.  [name]

## 2024-11-10 NOTE — Telephone Encounter (Signed)
Patient has been notified directly; all questions, if any, were answered. Patient voiced understanding.   

## 2024-11-10 NOTE — Telephone Encounter (Signed)
 Copied from CRM #[id number]. Topic: General - Call Back - No Documentation >> Nov 10, 2024 12:13 PM Rea C wrote: Reason for CRM: Pt is calling back. She sent a message on Jan 7th in regards to iron  infusion therapy and has not received a response back from NP Molena. Pt would like a call to follow through on next steps.   [phone] (M)

## 2024-12-01 ENCOUNTER — Inpatient Hospital Stay: Admitting: Family

## 2024-12-01 ENCOUNTER — Inpatient Hospital Stay

## 2024-12-03 ENCOUNTER — Inpatient Hospital Stay: Attending: Family | Admitting: Family

## 2024-12-03 DIAGNOSIS — D500 Iron deficiency anemia secondary to blood loss (chronic): Secondary | ICD-10-CM
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
## 2024-12-09 ENCOUNTER — Inpatient Hospital Stay

## 2025-01-16 ENCOUNTER — Inpatient Hospital Stay: Admitting: Family

## 2025-01-16 ENCOUNTER — Inpatient Hospital Stay

## 2025-02-02 ENCOUNTER — Ambulatory Visit: Admitting: Internal Medicine
